# Patient Record
Sex: Female | Born: 1935 | Race: White | Hispanic: No | Marital: Married | Smoking: Former smoker
Health system: Southern US, Community
[De-identification: ages and names within clinical notes are randomized; demographics above are authoritative.]

## PROBLEM LIST (undated history)

## (undated) DIAGNOSIS — R112 Nausea with vomiting, unspecified: Secondary | ICD-10-CM

## (undated) DIAGNOSIS — K449 Diaphragmatic hernia without obstruction or gangrene: Secondary | ICD-10-CM

## (undated) DIAGNOSIS — R3915 Urgency of urination: Secondary | ICD-10-CM

## (undated) DIAGNOSIS — Z8709 Personal history of other diseases of the respiratory system: Secondary | ICD-10-CM

## (undated) DIAGNOSIS — J479 Bronchiectasis, uncomplicated: Secondary | ICD-10-CM

## (undated) DIAGNOSIS — F0390 Unspecified dementia without behavioral disturbance: Secondary | ICD-10-CM

## (undated) DIAGNOSIS — I7121 Aneurysm of the ascending aorta, without rupture: Secondary | ICD-10-CM

## (undated) DIAGNOSIS — Z8041 Family history of malignant neoplasm of ovary: Secondary | ICD-10-CM

## (undated) DIAGNOSIS — Z803 Family history of malignant neoplasm of breast: Secondary | ICD-10-CM

## (undated) DIAGNOSIS — Z8619 Personal history of other infectious and parasitic diseases: Secondary | ICD-10-CM

## (undated) DIAGNOSIS — Z800 Family history of malignant neoplasm of digestive organs: Secondary | ICD-10-CM

## (undated) DIAGNOSIS — M1990 Unspecified osteoarthritis, unspecified site: Secondary | ICD-10-CM

## (undated) DIAGNOSIS — Z9889 Other specified postprocedural states: Secondary | ICD-10-CM

## (undated) DIAGNOSIS — Z87440 Personal history of urinary (tract) infections: Secondary | ICD-10-CM

## (undated) DIAGNOSIS — H40009 Preglaucoma, unspecified, unspecified eye: Secondary | ICD-10-CM

## (undated) DIAGNOSIS — J189 Pneumonia, unspecified organism: Secondary | ICD-10-CM

## (undated) DIAGNOSIS — Z87442 Personal history of urinary calculi: Secondary | ICD-10-CM

## (undated) DIAGNOSIS — I712 Thoracic aortic aneurysm, without rupture: Secondary | ICD-10-CM

## (undated) HISTORY — PX: CATARACT EXTRACTION W/ INTRAOCULAR LENS IMPLANT: SHX1309

## (undated) HISTORY — DX: Family history of malignant neoplasm of digestive organs: Z80.0

## (undated) HISTORY — DX: Family history of malignant neoplasm of breast: Z80.3

## (undated) HISTORY — PX: DILATION AND CURETTAGE OF UTERUS: SHX78

## (undated) HISTORY — PX: BREAST EXCISIONAL BIOPSY: SUR124

## (undated) HISTORY — DX: Family history of malignant neoplasm of ovary: Z80.41

---

## 1972-08-09 HISTORY — PX: TUBAL LIGATION: SHX77

## 1987-08-10 HISTORY — PX: NASAL SEPTUM SURGERY: SHX37

## 1996-08-09 HISTORY — PX: PUBOVAGINAL SLING: SHX1035

## 1997-08-09 HISTORY — PX: KNEE ARTHROSCOPY: SUR90

## 1998-02-04 ENCOUNTER — Ambulatory Visit (HOSPITAL_BASED_OUTPATIENT_CLINIC_OR_DEPARTMENT_OTHER): Admission: RE | Admit: 1998-02-04 | Discharge: 1998-02-04 | Payer: Self-pay | Admitting: Orthopedic Surgery

## 1999-02-04 ENCOUNTER — Other Ambulatory Visit: Admission: RE | Admit: 1999-02-04 | Discharge: 1999-02-04 | Payer: Self-pay | Admitting: Obstetrics and Gynecology

## 2000-09-26 ENCOUNTER — Ambulatory Visit (HOSPITAL_COMMUNITY): Admission: RE | Admit: 2000-09-26 | Discharge: 2000-09-26 | Payer: Self-pay | Admitting: Gastroenterology

## 2001-06-26 ENCOUNTER — Other Ambulatory Visit: Admission: RE | Admit: 2001-06-26 | Discharge: 2001-06-26 | Payer: Self-pay | Admitting: Obstetrics and Gynecology

## 2002-07-04 ENCOUNTER — Other Ambulatory Visit: Admission: RE | Admit: 2002-07-04 | Discharge: 2002-07-04 | Payer: Self-pay | Admitting: Obstetrics and Gynecology

## 2002-12-21 ENCOUNTER — Encounter: Admission: RE | Admit: 2002-12-21 | Discharge: 2002-12-21 | Payer: Self-pay | Admitting: Obstetrics and Gynecology

## 2002-12-21 ENCOUNTER — Encounter: Payer: Self-pay | Admitting: Obstetrics and Gynecology

## 2002-12-26 ENCOUNTER — Encounter: Payer: Self-pay | Admitting: Obstetrics and Gynecology

## 2002-12-26 ENCOUNTER — Encounter: Admission: RE | Admit: 2002-12-26 | Discharge: 2002-12-26 | Payer: Self-pay | Admitting: Obstetrics and Gynecology

## 2003-07-08 ENCOUNTER — Other Ambulatory Visit: Admission: RE | Admit: 2003-07-08 | Discharge: 2003-07-08 | Payer: Self-pay | Admitting: Obstetrics and Gynecology

## 2003-12-26 ENCOUNTER — Encounter: Admission: RE | Admit: 2003-12-26 | Discharge: 2003-12-26 | Payer: Self-pay | Admitting: Obstetrics and Gynecology

## 2004-07-08 ENCOUNTER — Other Ambulatory Visit: Admission: RE | Admit: 2004-07-08 | Discharge: 2004-07-08 | Payer: Self-pay | Admitting: Obstetrics and Gynecology

## 2004-12-28 ENCOUNTER — Encounter: Admission: RE | Admit: 2004-12-28 | Discharge: 2004-12-28 | Payer: Self-pay | Admitting: Obstetrics and Gynecology

## 2005-07-21 ENCOUNTER — Other Ambulatory Visit: Admission: RE | Admit: 2005-07-21 | Discharge: 2005-07-21 | Payer: Self-pay | Admitting: Obstetrics and Gynecology

## 2005-12-31 ENCOUNTER — Encounter: Admission: RE | Admit: 2005-12-31 | Discharge: 2005-12-31 | Payer: Self-pay | Admitting: Obstetrics and Gynecology

## 2006-01-12 ENCOUNTER — Encounter: Admission: RE | Admit: 2006-01-12 | Discharge: 2006-01-12 | Payer: Self-pay | Admitting: Orthopedic Surgery

## 2007-01-10 ENCOUNTER — Encounter: Admission: RE | Admit: 2007-01-10 | Discharge: 2007-01-10 | Payer: Self-pay | Admitting: Emergency Medicine

## 2008-01-11 ENCOUNTER — Encounter: Admission: RE | Admit: 2008-01-11 | Discharge: 2008-01-11 | Payer: Self-pay | Admitting: Physician Assistant

## 2009-01-27 ENCOUNTER — Encounter: Admission: RE | Admit: 2009-01-27 | Discharge: 2009-01-27 | Payer: Self-pay | Admitting: Obstetrics and Gynecology

## 2010-02-03 ENCOUNTER — Encounter: Admission: RE | Admit: 2010-02-03 | Discharge: 2010-02-03 | Payer: Self-pay | Admitting: Obstetrics and Gynecology

## 2010-12-25 NOTE — Procedures (Signed)
[facility]  Patient:    Tina Bell, Tina Bell                         MRN: [id number] Proc. Date: 09/26/00 Adm. Date:  [id number] Attending:  Orland Mustard CC:         Duncan Dull, M.D.   Procedure Report  PROCEDURE:  Colonoscopy.  MEDICATIONS:  Fentanyl 100 mcg, Versed 10 mg IV.  SCOPE:  Olympus pediatric video colonoscope.  INDICATIONS:  Colon cancer screening.  DESCRIPTION OF PROCEDURE:  The procedure had been explained to the patient and consent obtained.  With the patient in the left lateral decubitus position, the Olympus pediatric video colonoscope was inserted and advanced under direct visualization.  The prep was excellent.  We were able to advance to the cecum using abdominal pressure and position changes.  The ileocecal valve was seen well and was normal.  The scope was withdrawn.  The cecum, ascending colon, hepatic flexure, transverse colon, splenic flexure, descending, and sigmoid colon were seen well upon removal.  No significant diverticular disease, no polyps, or other lesions seen.  The patient tolerated the procedure well and was maintained on low-flow oxygen and pulse oximeter throughout the procedure with no obvious problem.  ASSESSMENT:  No evidence of colon polyps.  PLAN:  Routine follow-up with yearly Hemoccults.  Would suggest a sigmoid or colonoscopy in 5-10 years depending on the current recommendations at that time. DD:  09/26/00 TD:  09/27/00 Job: [id number]

## 2010-12-29 ENCOUNTER — Other Ambulatory Visit: Payer: Self-pay | Admitting: Obstetrics and Gynecology

## 2010-12-29 DIAGNOSIS — Z1231 Encounter for screening mammogram for malignant neoplasm of breast: Secondary | ICD-10-CM

## 2011-02-05 ENCOUNTER — Ambulatory Visit
Admission: RE | Admit: 2011-02-05 | Discharge: 2011-02-05 | Disposition: A | Discharge status: Home / Self Care | Payer: BC Managed Care – PPO | Source: Ambulatory Visit | Attending: Obstetrics and Gynecology | Admitting: Obstetrics and Gynecology

## 2011-02-05 DIAGNOSIS — Z1231 Encounter for screening mammogram for malignant neoplasm of breast: Secondary | ICD-10-CM

## 2011-10-11 ENCOUNTER — Encounter: Payer: Self-pay | Admitting: Gastroenterology

## 2012-01-10 ENCOUNTER — Other Ambulatory Visit: Payer: Self-pay | Admitting: Internal Medicine

## 2012-01-10 DIAGNOSIS — Z1231 Encounter for screening mammogram for malignant neoplasm of breast: Secondary | ICD-10-CM

## 2012-02-07 ENCOUNTER — Ambulatory Visit
Admission: RE | Admit: 2012-02-07 | Discharge: 2012-02-07 | Disposition: A | Discharge status: Home / Self Care | Payer: BC Managed Care – PPO | Source: Ambulatory Visit | Attending: Internal Medicine | Admitting: Internal Medicine

## 2012-02-07 DIAGNOSIS — Z1231 Encounter for screening mammogram for malignant neoplasm of breast: Secondary | ICD-10-CM

## 2013-01-15 ENCOUNTER — Other Ambulatory Visit: Payer: Self-pay

## 2013-01-15 DIAGNOSIS — Z1231 Encounter for screening mammogram for malignant neoplasm of breast: Secondary | ICD-10-CM

## 2013-02-08 ENCOUNTER — Ambulatory Visit: Payer: Medicare Other

## 2013-02-22 ENCOUNTER — Ambulatory Visit: Payer: Medicare Other

## 2013-02-26 ENCOUNTER — Ambulatory Visit
Admission: RE | Admit: 2013-02-26 | Discharge: 2013-02-26 | Disposition: A | Discharge status: Home / Self Care | Payer: Medicare Other | Source: Ambulatory Visit

## 2013-02-26 DIAGNOSIS — Z1231 Encounter for screening mammogram for malignant neoplasm of breast: Secondary | ICD-10-CM

## 2013-07-03 ENCOUNTER — Other Ambulatory Visit: Payer: Self-pay | Admitting: Urology

## 2013-07-03 ENCOUNTER — Encounter (HOSPITAL_COMMUNITY): Payer: Self-pay | Admitting: *Deleted

## 2013-07-04 ENCOUNTER — Encounter (HOSPITAL_COMMUNITY): Payer: Self-pay | Admitting: Pharmacy Technician

## 2013-07-07 NOTE — H&P (Signed)
History of Present Illness    F/u - Referred by Dr. Merri Brunette Nov 2014 .     1 - bacteriuria - patient has had 3 urinalyses which showed mixed pyuria and hematuria September, October, and November 2014. She was told she had a "UTI". She was having no symptoms - no dysuria, gross hematuria, frequency or urgency. Two cultures grew out enterococcus. I did not see any sensitivities. She was treated with Cipro November 2014. About 2 weeks ago she developed some frequency. No urgency. These symptoms are somewhat better today. She does take probiotics.   -Nov 2014 (today) PVR 5 ml    2-kidney stones - Nov 2014 - pt woke up with acute onset left flank pain. Pain was moderate and dull, constant. No fevers or chills. Pain recurred a day later. Pain went away. No pain today. No stone passage. She has a history of passing a kidney stone many years ago.   -Nov 2014 KUB today - no right stone, left kidney with three stones and a 9 mm stone over left renal pelvis. Multiple pelvic calcifications.     3-stress urinary incontinence-patient is status post bone anchor pubovaginal sling placed in 1998 by Dr. Logan Bores. Typically, she voids with a good stream and feels empty. No incontinence.     They travel a lot out of the country. They have not been in [place] recently.       Nov 2014 Interval hx  She returns for renal US and cystoscopy. She developed dysuria earlier today and her UA appears infected.     Renal U/S - comparison KUB - findings: right kidney appears norma, no stone, mass or hydro. Left kidney with three shadowing calcifications - correlates to KUB - 7mm, 5mm, 4mm. No hydro or mass. Bladder appears normal. PVR 5 ml   Past Medical History Problems  1. History of arthritis (V13.4)  Current Meds 1. Aspirin 81 MG Oral Tablet;  Therapy: (Recorded:13Nov2014) to Recorded 2. Calcium TABS;  Therapy: (Recorded:13Nov2014) to Recorded 3. CeleBREX CAPS;  Therapy:  (Recorded:13Nov2014) to Recorded 4. Glucosamine-Chondroitin Oral Capsule;  Therapy: (Recorded:13Nov2014) to Recorded 5. Krill Oil CAPS;  Therapy: (Recorded:13Nov2014) to Recorded 6. Multi-Vitamin TABS;  Therapy: (Recorded:13Nov2014) to Recorded 7. Vitamin C TABS;  Therapy: (Recorded:13Nov2014) to Recorded 8. Vitamin D TABS;  Therapy: (Recorded:13Nov2014) to Recorded  Allergies Medication  1. Keflex CAPS 2. Morphine Sulfate (PF) SOLN  Family History Problems  1. Family history of Death : Mother, Father 2. Family history of kidney stones (V18.69) : Mother, Son, Sister 3. Family history of malignant neoplasm of breast (V16.3) : Mother, Sister 4. Family history of Ovarian ca : Mother  Social History Problems  1. Alcohol use 2. Former smoker (V15.82) 3. Married 4. Number of children 5. Retired  Review of Systems Constitutional, skin, eye, otolaryngeal, hematologic/lymphatic, cardiovascular, pulmonary, endocrine, musculoskeletal, gastrointestinal, neurological and psychiatric system(s) were reviewed and pertinent findings if present are noted.    Vitals Vital Signs [Data Includes: Last 1 Day]  Recorded: 25Nov2014 01:55PM  Blood Pressure: 149 / 89 Temperature: 97.8 F Heart Rate: 67  Physical Exam Constitutional: Well nourished and well developed . No acute distress.  Pulmonary: No respiratory distress and normal respiratory rhythm and effort.  Cardiovascular: Heart rate and rhythm are normal . No peripheral edema.  Neuro/Psych:. Mood and affect are appropriate.    Results/Data Urine [Data Includes: Last 1 Day]   25Nov2014  COLOR YELLOW   APPEARANCE CLEAR   SPECIFIC GRAVITY 1.020   pH  6.5   GLUCOSE NEG mg/dL  BILIRUBIN NEG   KETONE TRACE mg/dL  BLOOD TRACE   PROTEIN TRACE mg/dL  UROBILINOGEN 0.2 mg/dL  NITRITE NEG   LEUKOCYTE ESTERASE LARGE   SQUAMOUS EPITHELIAL/HPF FEW   WBC TNTC WBC/hpf  RBC 0-2 RBC/hpf  BACTERIA FEW   CRYSTALS NONE SEEN   CASTS NONE SEEN     Procedure KUB today - bowel gas pattern and bones appeared normal. The right kidney does not appear to contain any calcifications. The left kidney appears to contain 3 calcifications the largest in the midpole possible renal pelvis about 9 mm. There were no obvious stones over the ureters. There are multiple calcifications in the pelvis but these appear inferior and lateral on both sides and are likely pelvic phleboliths.     Assessment Assessed  1. UTI (lower urinary tract infection) (599.0) 2. Calculus of kidney (592.0)  Plan  Calculus of kidney  1. Follow-up Schedule Surgery Office  Follow-up  Status: Complete  Done: 25Nov2014 Health Maintenance  2. UA With REFLEX; Status:Complete;   Done: 25Nov2014 01:06PM UTI (lower urinary tract infection)  3. Start: Nitrofurantoin Monohyd Macro 100 MG Oral Capsule; TAKE 1 CAPSULE TWICE  DAILY WITH MEALS  URINE CULTURE; Status:In Progress - Specimen/Data Collected;  Done: 25Nov2014 Perform:[facility]; Due:27Nov2014; Marked Important; Last Updated ZO:XWRUEA, Clydie Braun; 07/03/2013 2:09:08 PM;Ordered; Today;  For:UTI (lower urinary tract infection); Ordered VW:[id number], Lillard Bailon;   Discussion/Summary UTI - Will hold off on cystoscopy today. Send urine for Cx and start NF. Consider prophylaxis.   Nephrloithiasis - discussed KUB and U/S findings (reviewed images with patient) and the nature, R/B of surveillance, ESWL or URS. All questions answered. They elect to proceed with treatment. She feels like she started to pass one earlier this month and they travel a lot and don't want to risk stone passage afar. We discussed risks of ESWL such as infection, bleeding, failure to fragment or pass among others.     I'll plan to perform cystoscopy when she returns if urine clear and/or she is asymptomatic.      Signatures Electronically signed by : Jerilee Field, M.D.; Jul 03 2013  2:42PM EST  ADD: Urine cx grew enterococcus - sensitive to NF. I changed  pre-op abx to Levaquin.

## 2013-07-09 ENCOUNTER — Encounter (HOSPITAL_COMMUNITY): Payer: Self-pay | Admitting: General Practice

## 2013-07-09 ENCOUNTER — Ambulatory Visit (HOSPITAL_COMMUNITY): Payer: Medicare Other

## 2013-07-09 ENCOUNTER — Ambulatory Visit (HOSPITAL_COMMUNITY)
Admission: RE | Admit: 2013-07-09 | Discharge: 2013-07-09 | Disposition: A | Discharge status: Home / Self Care | Payer: Medicare Other | Source: Ambulatory Visit | Attending: Urology | Admitting: Urology

## 2013-07-09 ENCOUNTER — Encounter (HOSPITAL_COMMUNITY)
Admission: RE | Disposition: A | Discharge status: Home / Self Care | Payer: Self-pay | Source: Ambulatory Visit | Attending: Urology

## 2013-07-09 ENCOUNTER — Other Ambulatory Visit: Payer: Self-pay | Admitting: Urology

## 2013-07-09 DIAGNOSIS — N390 Urinary tract infection, site not specified: Secondary | ICD-10-CM | POA: Insufficient documentation

## 2013-07-09 DIAGNOSIS — R3129 Other microscopic hematuria: Secondary | ICD-10-CM | POA: Insufficient documentation

## 2013-07-09 DIAGNOSIS — N200 Calculus of kidney: Secondary | ICD-10-CM | POA: Insufficient documentation

## 2013-07-09 DIAGNOSIS — N393 Stress incontinence (female) (male): Secondary | ICD-10-CM | POA: Insufficient documentation

## 2013-07-09 DIAGNOSIS — Z79899 Other long term (current) drug therapy: Secondary | ICD-10-CM | POA: Insufficient documentation

## 2013-07-09 DIAGNOSIS — Z7982 Long term (current) use of aspirin: Secondary | ICD-10-CM | POA: Insufficient documentation

## 2013-07-09 HISTORY — DX: Pneumonia, unspecified organism: J18.9

## 2013-07-09 HISTORY — PX: EXTRACORPOREAL SHOCK WAVE LITHOTRIPSY: SHX1557

## 2013-07-09 HISTORY — DX: Unspecified osteoarthritis, unspecified site: M19.90

## 2013-07-09 HISTORY — DX: Personal history of urinary calculi: Z87.442

## 2013-07-09 SURGERY — LITHOTRIPSY, ESWL
Anesthesia: LOCAL | Laterality: Left

## 2013-07-09 MED ORDER — SODIUM CHLORIDE 0.9 % IV SOLN
INTRAVENOUS | Status: DC
  Administered 2013-07-09: 14:00:00 via INTRAVENOUS

## 2013-07-09 MED ORDER — DIPHENHYDRAMINE HCL 25 MG PO CAPS
25.0000 mg | ORAL_CAPSULE | ORAL | Status: AC
  Administered 2013-07-09: 25 mg via ORAL
  Filled 2013-07-09: qty 1

## 2013-07-09 MED ORDER — OXYCODONE-ACETAMINOPHEN 5-325 MG PO TABS: 1.0000 | ORAL_TABLET | Freq: Four times a day (QID) | ORAL | Status: DC | PRN

## 2013-07-09 MED ORDER — LEVOFLOXACIN 500 MG PO TABS: 500.0000 mg | ORAL_TABLET | Freq: Every day | ORAL | Status: DC

## 2013-07-09 MED ORDER — LEVOFLOXACIN IN D5W 750 MG/150ML IV SOLN
500.0000 mg | INTRAVENOUS | Status: AC
  Administered 2015-11-28: 500 mg via INTRAVENOUS

## 2013-07-09 MED ORDER — DIAZEPAM 5 MG PO TABS
10.0000 mg | ORAL_TABLET | ORAL | Status: AC
  Administered 2013-07-09: 10 mg via ORAL
  Filled 2013-07-09: qty 2

## 2013-07-09 NOTE — Brief Op Note (Signed)
07/09/2013  See scanned [facility] center Op note

## 2013-07-09 NOTE — Interval H&P Note (Signed)
History and Physical Interval Note:  07/09/2013 4:44 PM  Tina Bell  has presented today for surgery, with the diagnosis of LEFT NEPHROLITHIASIS   The various methods of treatment have been discussed with the patient and family. After consideration of risks, benefits and other options for treatment, the patient has consented to  Procedure(s): LEFT EXTRACORPOREAL SHOCK WAVE LITHOTRIPSY (ESWL) (Left) as a surgical intervention .  The patient's history has been reviewed, patient examined, no change in status, stable for surgery.  I have reviewed the patient's chart and labs.  Questions were answered to the patient's satisfaction.  Pt reports she has been taking NF. She has been well.    Antony Haste

## 2013-11-02 DIAGNOSIS — C4492 Squamous cell carcinoma of skin, unspecified: Secondary | ICD-10-CM

## 2013-11-02 HISTORY — DX: Squamous cell carcinoma of skin, unspecified: C44.92

## 2013-12-26 ENCOUNTER — Institutional Professional Consult (permissible substitution): Payer: Medicare Other | Admitting: Internal Medicine

## 2014-01-02 ENCOUNTER — Ambulatory Visit (INDEPENDENT_AMBULATORY_CARE_PROVIDER_SITE_OTHER): Payer: Medicare Other | Admitting: Pulmonary Disease

## 2014-01-02 ENCOUNTER — Encounter: Payer: Self-pay | Admitting: Pulmonary Disease

## 2014-01-02 VITALS — BP 116/68 | HR 60 | Ht 63.0 in | Wt 126.0 lb

## 2014-01-02 DIAGNOSIS — J189 Pneumonia, unspecified organism: Secondary | ICD-10-CM

## 2014-01-02 DIAGNOSIS — J479 Bronchiectasis, uncomplicated: Secondary | ICD-10-CM

## 2014-01-02 NOTE — Telephone Encounter (Signed)
5/27 mychart email from patient: Were you able to add the additional test to my blood work taken this AM at Dr. Pennie Banter office?   Tina Bell   Pt seen today by BQ for new consult referred by Dr Archie Endo, do you know what exactly pt is asking?  Thanks!

## 2014-01-02 NOTE — Assessment & Plan Note (Signed)
She has been told that she has recurrent pneumonia. However, have reviewed the x-ray images from her most recent episode of "pneumonia". There is no evidence of an infiltrate that I can tell. It also sounds like she never had a fever. So based on this, it's hard for me to say that she truly had pneumonia. However, she does note multiple very lengthy courses of either pneumonia or bronchitis over the years. She also notes that she had pertussis as a child. This makes me wonder if she does not in fact have bronchiectasis.  Plan: -CT chest, high-resolution to look for bronchiectasis -Full pulmonary function testing -Check serum immunoglobulins and complement levels to check for systemic immunodeficiency (though I doubt that this is the case as she does not have recurrent infections of other types with the exception of the occasional UTI) Followup 4 weeks

## 2014-01-02 NOTE — Progress Notes (Signed)
Subjective:    Patient ID: Tina Bell, female    DOB: May 30, 1936, 78 y.o.   MRN: [id number]  HPI  This is a very pleasant 78 year old female who comes to my clinic today for evaluation of the possibility of recurrent pneumonia. She stated that she had pneumonia in 1997 and then again in 2000. She was evaluated by my partner at that time. She had lung function testing which was normal and she was told that in fact she did not have recurrent pneumonia but just bronchitis on one episode of pneumonia second time. Over the years she has not had recurrent infections on an annual basis or even frequent infections with the exception of 2 occasions. She did have an episode of either bronchitis versus pneumonia in 2009. This was treated as an outpatient. She said that she needed antibiotics for approximately 14 days before she got better. Then in 2015 she was going on a trip to [place] and again she got an episode of a bad respiratory infection. She said that she felt subjective fevers but she did not have a fever when she sought medical care. She did have a cough productive of some sputum.  Apparently a chest x-ray was suggestive of some possible infiltrates in the right hilum. She was treated with azithromycin for 3 days and apparently her symptoms nearly completely resolved. However, she sought care with her primary care physician at home and because symptoms have not completely resolved she was treated with 5 more days of azithromycin. She now states that she is doing well. With the exception of some knee pain she does not have shortness of breath.  In between episodes of infection she does not have problems with recurrent bronchitis or shortness of breath. She stays quite active with tennis and golf.  Past Medical History  Diagnosis Date  . Pneumonia 1997  . History of kidney stones 6294,7654  . Arthritis     knees  . Complication of anesthesia     difficulty waking up from anesthesia  . Hx of  seasonal allergies      Family History  Problem Relation Age of Onset  . Cancer Mother     ovarian  . Cancer Sister     breast     History   Social History  . Marital Status: Married    Spouse Name: N/A    Number of Children: N/A  . Years of Education: N/A   Occupational History  . Not on file.   Social History Main Topics  . Smoking status: Former Smoker -- 0.20 packs/day for 5 years    Types: Cigarettes    Quit date: 05/03/1962  . Smokeless tobacco: Never Used     Comment: Smoked 1 pack/week while smoking.   . Alcohol Use: Yes     Comment: occasional drink  . Drug Use: No  . Sexual Activity: Not on file   Other Topics Concern  . Not on file   Social History Narrative  . No narrative on file     Allergies  Allergen Reactions  . Keflex [Cephalexin] Hives  . Morphine And Related Hives     Outpatient Prescriptions Prior to Visit  Medication Sig Dispense Refill  . aspirin 81 MG tablet Take 81 mg by mouth daily.      . calcium-vitamin D (OSCAL WITH D) 500-200 MG-UNIT per tablet Take 1 tablet by mouth 2 (two) times daily.       . celecoxib (CELEBREX) 200 MG  capsule Take 200 mg by mouth 2 (two) times daily.      . cholecalciferol (VITAMIN D) 1000 UNITS tablet Take 1,000 Units by mouth daily.      Javier Docker Oil 1000 MG CAPS Take 1 capsule by mouth 2 (two) times daily.       . Multiple Vitamins-Minerals (WOMENS MULTI VITAMIN & MINERAL PO) Take by mouth daily.      . Probiotic Product (PROBIOTIC DAILY PO) Take 1 tablet by mouth daily.       . vitamin C (ASCORBIC ACID) 500 MG tablet Take 500 mg by mouth daily.      . Glucosamine-Chondroit-Vit C-Mn (GLUCOSAMINE 1500 COMPLEX PO) Take 2 tablets by mouth 2 (two) times daily.       Marland Kitchen levofloxacin (LEVAQUIN) 500 MG tablet Take 1 tablet (500 mg total) by mouth daily.  3 tablet  0  . nitrofurantoin (MACRODANTIN) 100 MG capsule Take 100 mg by mouth 2 (two) times daily. Started 07/03/13 and to end 07/07/13 PM (10 doses only)        . oxyCODONE-acetaminophen (ROXICET) 5-325 MG per tablet Take 1-2 tablets by mouth every 6 (six) hours as needed for severe pain.  30 tablet  0   Facility-Administered Medications Prior to Visit  Medication Dose Route Frequency Provider Last Rate Last Dose  . levofloxacin (LEVAQUIN) IVPB 500 mg  500 mg Intravenous Q24H Festus Aloe, MD          Review of Systems  Constitutional: Positive for unexpected weight change. Negative for fever.  HENT: Positive for sinus pressure. Negative for congestion, dental problem, ear pain, nosebleeds, postnasal drip, rhinorrhea, sneezing, sore throat and trouble swallowing.   Eyes: Negative for redness and itching.  Respiratory: Positive for cough and chest tightness. Negative for shortness of breath and wheezing.   Cardiovascular: Negative for palpitations and leg swelling.  Gastrointestinal: Negative for nausea and vomiting.  Genitourinary: Negative for dysuria.  Musculoskeletal: Negative for joint swelling.  Skin: Negative for rash.  Neurological: Negative for headaches.  Hematological: Does not bruise/bleed easily.  Psychiatric/Behavioral: Negative for dysphoric mood. The patient is not nervous/anxious.        Objective:   Physical Exam Filed Vitals:   01/02/14 1135  BP: 116/68  Pulse: 60  Height: 5\' 3"  (1.6 m)  Weight: 126 lb (57.153 kg)  SpO2: 98%  RA  Gen: well appearing, no acute distress HEENT: NCAT, PERRL, EOMi, OP clear, neck supple without masses PULM: CTA B CV: RRR, no mgr, no JVD AB: BS+, soft, nontender, no hsm Ext: warm, no edema, no clubbing, no cyanosis Derm: no rash or skin breakdown Neuro: A&Ox4, CN II-XII intact, strength 5/5 in all 4 extremities   May 2015 chest x-ray reviewed> Normal lung parenchyma no infiltrate normal cardiac silhouette      Assessment & Plan:   CAP (community acquired pneumonia) She has been told that she has recurrent pneumonia. However, have reviewed the x-ray images from her most  recent episode of "pneumonia". There is no evidence of an infiltrate that I can tell. It also sounds like she never had a fever. So based on this, it's hard for me to say that she truly had pneumonia. However, she does note multiple very lengthy courses of either pneumonia or bronchitis over the years. She also notes that she had pertussis as a child. This makes me wonder if she does not in fact have bronchiectasis.  Plan: -CT chest, high-resolution to look for bronchiectasis -Full pulmonary function testing -  Check serum immunoglobulins and complement levels to check for systemic immunodeficiency (though I doubt that this is the case as she does not have recurrent infections of other types with the exception of the occasional UTI) Followup 4 weeks     Updated Medication List Outpatient Encounter Prescriptions as of 01/02/2014  Medication Sig  . aspirin 81 MG tablet Take 81 mg by mouth daily.  . calcium-vitamin D (OSCAL WITH D) 500-200 MG-UNIT per tablet Take 1 tablet by mouth 2 (two) times daily.   . celecoxib (CELEBREX) 200 MG capsule Take 200 mg by mouth 2 (two) times daily.  . cholecalciferol (VITAMIN D) 1000 UNITS tablet Take 1,000 Units by mouth daily.  Javier Docker Oil 1000 MG CAPS Take 1 capsule by mouth 2 (two) times daily.   . Multiple Vitamins-Minerals (WOMENS MULTI VITAMIN & MINERAL PO) Take by mouth daily.  . Probiotic Product (PROBIOTIC DAILY PO) Take 1 tablet by mouth daily.   . vitamin C (ASCORBIC ACID) 500 MG tablet Take 500 mg by mouth daily.  . [DISCONTINUED] Glucosamine-Chondroit-Vit C-Mn (GLUCOSAMINE 1500 COMPLEX PO) Take 2 tablets by mouth 2 (two) times daily.   . [DISCONTINUED] levofloxacin (LEVAQUIN) 500 MG tablet Take 1 tablet (500 mg total) by mouth daily.  . [DISCONTINUED] nitrofurantoin (MACRODANTIN) 100 MG capsule Take 100 mg by mouth 2 (two) times daily. Started 07/03/13 and to end 07/07/13 PM (10 doses only)  . [DISCONTINUED] oxyCODONE-acetaminophen (ROXICET) 5-325 MG  per tablet Take 1-2 tablets by mouth every 6 (six) hours as needed for severe pain.

## 2014-01-08 ENCOUNTER — Ambulatory Visit (INDEPENDENT_AMBULATORY_CARE_PROVIDER_SITE_OTHER)
Admission: RE | Admit: 2014-01-08 | Discharge: 2014-01-08 | Disposition: A | Discharge status: Home / Self Care | Payer: Medicare Other | Source: Ambulatory Visit | Attending: Pulmonary Disease | Admitting: Pulmonary Disease

## 2014-01-08 DIAGNOSIS — J479 Bronchiectasis, uncomplicated: Secondary | ICD-10-CM

## 2014-01-09 ENCOUNTER — Encounter: Payer: Self-pay | Admitting: Pulmonary Disease

## 2014-01-09 DIAGNOSIS — J479 Bronchiectasis, uncomplicated: Secondary | ICD-10-CM | POA: Insufficient documentation

## 2014-01-10 ENCOUNTER — Ambulatory Visit (HOSPITAL_COMMUNITY)
Admission: RE | Admit: 2014-01-10 | Discharge: 2014-01-10 | Disposition: A | Discharge status: Home / Self Care | Payer: Medicare Other | Source: Ambulatory Visit | Attending: Pulmonary Disease | Admitting: Pulmonary Disease

## 2014-01-10 ENCOUNTER — Telehealth: Payer: Self-pay | Admitting: Pulmonary Disease

## 2014-01-10 DIAGNOSIS — R05 Cough: Secondary | ICD-10-CM | POA: Insufficient documentation

## 2014-01-10 DIAGNOSIS — R059 Cough, unspecified: Secondary | ICD-10-CM | POA: Insufficient documentation

## 2014-01-10 DIAGNOSIS — J479 Bronchiectasis, uncomplicated: Secondary | ICD-10-CM

## 2014-01-10 DIAGNOSIS — Z87891 Personal history of nicotine dependence: Secondary | ICD-10-CM | POA: Insufficient documentation

## 2014-01-10 MED ORDER — ALBUTEROL SULFATE (2.5 MG/3ML) 0.083% IN NEBU
2.5000 mg | INHALATION_SOLUTION | Freq: Once | RESPIRATORY_TRACT | Status: AC
  Administered 2014-01-10: 2.5 mg via RESPIRATORY_TRACT

## 2014-01-10 NOTE — Telephone Encounter (Signed)
A, Please let her know that this showed bronchiectasis (the disease associated with pertussis she and I talked about) and we will discuss more in clinic. Thanks B --  [email or web address] x1

## 2014-01-10 NOTE — Telephone Encounter (Signed)
Pt returning call.Tina Bell ° °

## 2014-01-10 NOTE — Progress Notes (Signed)
Quick Note:  lmtcb X1 to relay results. Pt also needs a rov scheduled. Please try to schedule on/around 6/19 with BQ in [place]. ______

## 2014-01-10 NOTE — Telephone Encounter (Signed)
I spoke with patient about results and she verbalized understanding and had no questions 

## 2014-01-15 LAB — PULMONARY FUNCTION TEST
DL/VA % pred: 93 %
DL/VA: 4.36 ml/min/mmHg/L
DLCO unc % pred: 73 %
DLCO unc: 16.84 ml/min/mmHg
FEF 25-75 Post: 1.7 L/sec
FEF 25-75 Pre: 1.8 L/sec
FEF2575-%Change-Post: -5 %
FEF2575-%Pred-Post: 108 %
FEF2575-%Pred-Pre: 114 %
FEV1-%Change-Post: -3 %
FEV1-%Pred-Post: 93 %
FEV1-%Pred-Pre: 97 %
FEV1-Post: 1.86 L
FEV1-Pre: 1.93 L
FEV1FVC-%Change-Post: -2 %
FEV1FVC-%Pred-Pre: 108 %
FEV6-%Change-Post: 0 %
FEV6-%Pred-Post: 92 %
FEV6-%Pred-Pre: 93 %
FEV6-Post: 2.35 L
FEV6-Pre: 2.37 L
FEV6FVC-%Pred-Post: 105 %
FEV6FVC-%Pred-Pre: 105 %
FVC-%Change-Post: 0 %
FVC-%Pred-Post: 87 %
FVC-%Pred-Pre: 88 %
FVC-Post: 2.35 L
FVC-Pre: 2.37 L
Post FEV1/FVC ratio: 79 %
Post FEV6/FVC ratio: 100 %
Pre FEV1/FVC ratio: 82 %
Pre FEV6/FVC Ratio: 100 %
RV % pred: 99 %
RV: 2.25 L
TLC % pred: 92 %
TLC: 4.51 L

## 2014-01-17 ENCOUNTER — Ambulatory Visit: Payer: Medicare Other

## 2014-01-17 DIAGNOSIS — J189 Pneumonia, unspecified organism: Secondary | ICD-10-CM

## 2014-01-18 ENCOUNTER — Telehealth: Payer: Self-pay | Admitting: Pulmonary Disease

## 2014-01-18 LAB — IGG, IGA, IGM
IgA: 260 mg/dL (ref 69–380)
IgG (Immunoglobin G), Serum: 807 mg/dL (ref 690–1700)
IgM, Serum: 232 mg/dL (ref 52–322)

## 2014-01-18 LAB — C4 COMPLEMENT: C4 Complement: 34 mg/dL (ref 10–40)

## 2014-01-18 LAB — C3 COMPLEMENT: C3 Complement: 103 mg/dL (ref 90–180)

## 2014-01-18 NOTE — Telephone Encounter (Signed)
I spoke with Tina Bell at [facility] and she states they could not perform the CH50 Complement total because they did not receive it frozen. She states that test requires a frozen pour. Please advise and we can call the pt and bring her back in for a re-draw. Thanks. Depew Bing, CMA

## 2014-01-20 NOTE — Telephone Encounter (Signed)
I may order it in clinic next time, nothing extra to do now

## 2014-01-21 ENCOUNTER — Encounter: Payer: Self-pay | Admitting: Pulmonary Disease

## 2014-01-21 NOTE — Telephone Encounter (Signed)
Called and spoke to [place] with [facility]. Informed her that BQ will not be re-ordering lab and to not anticipate pt coming back in for a re-draw this time. Nothing further needed.

## 2014-01-22 NOTE — Progress Notes (Signed)
Quick Note:  Pt aware of results and recs. Nothing further needed. ______

## 2014-02-04 ENCOUNTER — Ambulatory Visit (INDEPENDENT_AMBULATORY_CARE_PROVIDER_SITE_OTHER): Payer: Medicare Other | Admitting: Pulmonary Disease

## 2014-02-04 ENCOUNTER — Encounter: Payer: Self-pay | Admitting: Pulmonary Disease

## 2014-02-04 VITALS — BP 128/72 | HR 78 | Ht 63.0 in | Wt 128.0 lb

## 2014-02-04 DIAGNOSIS — J479 Bronchiectasis, uncomplicated: Secondary | ICD-10-CM

## 2014-02-04 MED ORDER — AMOXICILLIN 500 MG PO CAPS: 500.0000 mg | ORAL_CAPSULE | Freq: Two times a day (BID) | ORAL | Status: DC

## 2014-02-04 NOTE — Assessment & Plan Note (Signed)
Her CT chest was consistent with bronchiectasis. I believe that this is due to a severe pertussis infection as a child. Her other laboratory testing was not suggestive of any sort of underlying immunodeficiency. She does not have a family history consistent with lung disease so I don't think there is a role for testing for cystic fibrosis.  Overall, her bronchiectasis is mild in that her lung function testing was essentially completely normal and she has not had a severe flare.  Today was in a long time talking about the ways that she should treat her bronchiectasis. Specifically, I advised her that whenever she gets an upper respiratory infection she should be treated with 14 days of antibiotics.  However, today she notes that she has had several days of increasing cough, sputum production, and chest tightness. This is likely consistent with a mild flare of bronchiectasis.  Plan: -Amoxicillin twice a day for 14 days, 1 refill written for her upcoming trip overseas (to be taken if she develops an infection) -No indication for inhaled therapy -Given the fact that she typically does not have mucus production when she's not having a flare I see no role for routine mucociliary clearance measures -Followup in 6 months

## 2014-02-04 NOTE — Patient Instructions (Signed)
Take the amoxicillin twice a day for two weeks Provide [place] with a sample of mucus when you can We will see you back in 6 months or sooner if needed

## 2014-02-04 NOTE — Progress Notes (Signed)
Subjective:    Patient ID: Tina Bell, female    DOB: [birth year], 78 y.o.   MRN: [id number]  Synopsis: First referred to [facility] in 2015. Found to have bronchiectasis on high-risk CT in 2015 this is believed to be due to 82 severe pertussis infection as a child. Never smoked 12/2013 CT chest high res (entrikin read)> scattered throughout the lungs, particularly in the lingula and RML, there is cylindrical bronchiectasis and bronchiolectasis with nodularity suggestive of MAI, extensive air trapping and atherosclerosis, small hiatal hernia June 2015 pulmonary function test> normal ratio, FEV1 93% predicted, no change with bronchodilator, total lung capacity normal, DLCO slightly low at 73% predicted  HPI  02/04/2014 routine office visit> Tina Bell tells me that for the last several days she's had increasing chest tightness, wheezing, and mucus production. She has been coughing up yellow mucus for about a week now. Other than that she has been doing well. She has not had fevers or chills or chest pain. She denies nausea vomiting or diarrhea. She has not taken any medication for the cough. She has an upcoming trip to the [place] planned.  Past Medical History  Diagnosis Date  . Pneumonia 1997  . History of kidney stones [id number]  . Arthritis     knees  . Complication of anesthesia     difficulty waking up from anesthesia  . Hx of seasonal allergies      Review of Systems     Objective:   Physical Exam Filed Vitals:   02/04/14 1406  BP: 128/72  Pulse: 78  Height: 5\' 3"  (1.6 m)  Weight: 128 lb (58.06 kg)  SpO2: 95%   RA  Gen: well appearing, no acute distress HEENT: NCAT, OP clear, PULM: CTA B CV: RRR, no mgr, no JVD AB: BS+, soft, nontender, no hsm Ext: warm, no edema, no clubbing, no cyanosis Derm: no rash or skin breakdown     Assessment & Plan:   Bronchiectasis without acute exacerbation Her CT chest was consistent with bronchiectasis. I believe  that this is due to a severe pertussis infection as a child. Her other laboratory testing was not suggestive of any sort of underlying immunodeficiency. She does not have a family history consistent with lung disease so I don't think there is a role for testing for cystic fibrosis.  Overall, her bronchiectasis is mild in that her lung function testing was essentially completely normal and she has not had a severe flare.  Today was in a long time talking about the ways that she should treat her bronchiectasis. Specifically, I advised her that whenever she gets an upper respiratory infection she should be treated with 14 days of antibiotics.  However, today she notes that she has had several days of increasing cough, sputum production, and chest tightness. This is likely consistent with a mild flare of bronchiectasis.  Plan: -Amoxicillin twice a day for 14 days, 1 refill written for her upcoming trip overseas (to be taken if she develops an infection) -No indication for inhaled therapy -Given the fact that she typically does not have mucus production when she's not having a flare I see no role for routine mucociliary clearance measures -Followup in 6 months      Updated Medication List Outpatient Encounter Prescriptions as of 02/04/2014  Medication Sig  . aspirin 81 MG tablet Take 81 mg by mouth daily.  . calcium-vitamin D (OSCAL WITH D) 500-200 MG-UNIT per tablet Take 1 tablet by mouth 2 (two) times daily.   Marland Kitchen  celecoxib (CELEBREX) 200 MG capsule Take 200 mg by mouth 2 (two) times daily.  . cholecalciferol (VITAMIN D) 1000 UNITS tablet Take 1,000 Units by mouth daily.  Javier Docker Oil 1000 MG CAPS Take 1 capsule by mouth 2 (two) times daily.   . Multiple Vitamins-Minerals (WOMENS MULTI VITAMIN & MINERAL PO) Take by mouth daily.  . Probiotic Product (PROBIOTIC DAILY PO) Take 1 tablet by mouth daily.   . vitamin C (ASCORBIC ACID) 500 MG tablet Take 500 mg by mouth daily.  Marland Kitchen amoxicillin (AMOXIL)  500 MG capsule Take 1 capsule (500 mg total) by mouth 2 (two) times daily.

## 2014-02-05 ENCOUNTER — Other Ambulatory Visit: Payer: Self-pay

## 2014-02-05 DIAGNOSIS — Z1231 Encounter for screening mammogram for malignant neoplasm of breast: Secondary | ICD-10-CM

## 2014-03-04 ENCOUNTER — Ambulatory Visit
Admission: RE | Admit: 2014-03-04 | Discharge: 2014-03-04 | Disposition: A | Discharge status: Home / Self Care | Payer: Medicare Other | Source: Ambulatory Visit

## 2014-03-04 DIAGNOSIS — Z1231 Encounter for screening mammogram for malignant neoplasm of breast: Secondary | ICD-10-CM

## 2014-07-23 ENCOUNTER — Ambulatory Visit (INDEPENDENT_AMBULATORY_CARE_PROVIDER_SITE_OTHER): Payer: Medicare Other | Admitting: Pulmonary Disease

## 2014-07-23 ENCOUNTER — Encounter: Payer: Self-pay | Admitting: Pulmonary Disease

## 2014-07-23 VITALS — BP 138/88 | HR 72 | Temp 97.4°F | Ht 63.0 in

## 2014-07-23 DIAGNOSIS — J479 Bronchiectasis, uncomplicated: Secondary | ICD-10-CM

## 2014-07-23 MED ORDER — FLUTTER DEVI: Status: DC

## 2014-07-23 NOTE — Patient Instructions (Signed)
Use the flutter valve 5-10 puffs 2-3 times a day when you are sick to help you get up mucus Take the Z-pack if needed for a case of bronchitis Use the tussionex at night, be careful not to take this with other sedating medications or alcohol We will see you back in 6 months or sooner if needed

## 2014-07-23 NOTE — Assessment & Plan Note (Signed)
She has very mild bronchiectasis and this has been a stable interval for her. However, she always seems to get flares when she travels outside of the country. I'm not really sure why that is.  Plan: -I'm going to write a prescription for azithromycin as well as a cough medicine for her to use if needed on her next travel adventure -I educated her on the appropriate use of a flutter valve in the setting of bronchitis versus a bronchiectasis flare -I also educated her on the fact that it can take 14-21 days sometimes for cough and mucus production to resolve after an acute bronchiectasis flare

## 2014-07-23 NOTE — Progress Notes (Signed)
Subjective:    Patient ID: Tina Bell, female    DOB: 22-Jul-1936, 78 y.o.   MRN: [id number]  Synopsis: First referred to [facility] in 2015. Found to have bronchiectasis on high-resolution CT in 2015 this is believed to be due to a severe pertussis infection as a child. Never smoked 12/2013 CT chest high res (entrikin read)> scattered throughout the lungs, particularly in the lingula and RML, there is cylindrical bronchiectasis and bronchiolectasis with nodularity suggestive of MAI, extensive air trapping and atherosclerosis, small hiatal hernia June 2015 pulmonary function test> normal ratio, FEV1 93% predicted, no change with bronchodilator, total lung capacity normal, DLCO slightly low at 73% predicted  HPI  Chief Complaint  Patient presents with  . Follow-up    Pt having no breathing complaints at this time aside from occasional runny nose.     Tina Bell traveled recently to [place] and somewhat predictably she developed a cough.  She took a cough medicine and decongestant from a local pharmacy and then then needed the azithromycin.  Otherwise she is doing well, no dyspnea.   Past Medical History  Diagnosis Date  . Pneumonia 1997  . History of kidney stones 3354,5625  . Arthritis     knees  . Complication of anesthesia     difficulty waking up from anesthesia  . Hx of seasonal allergies      Review of Systems     Objective:   Physical Exam Filed Vitals:   07/23/14 0917  BP: 138/88  Pulse: 72  Temp: 97.4 F (36.3 C)  TempSrc: Oral  Height: 5\' 3"  (1.6 m)  SpO2: 98%   RA  Gen: well appearing, no acute distress HEENT: NCAT, OP clear, PULM: CTA B CV: RRR, no mgr, no JVD AB: BS+, soft, nontender, no hsm Ext: warm, no edema, no clubbing, no cyanosis Derm: no rash or skin breakdown     Assessment & Plan:   Bronchiectasis without acute exacerbation She has very mild bronchiectasis and this has been a stable interval for her. However, she always seems  to get flares when she travels outside of the country. I'm not really sure why that is.  Plan: -I'm going to write a prescription for azithromycin as well as a cough medicine for her to use if needed on her next travel adventure -I educated her on the appropriate use of a flutter valve in the setting of bronchitis versus a bronchiectasis flare -I also educated her on the fact that it can take 14-21 days sometimes for cough and mucus production to resolve after an acute bronchiectasis flare    Updated Medication List Outpatient Encounter Prescriptions as of 07/23/2014  Medication Sig  . calcium-vitamin D (OSCAL WITH D) 500-200 MG-UNIT per tablet Take 1 tablet by mouth 2 (two) times daily.   . celecoxib (CELEBREX) 200 MG capsule Take 200 mg by mouth 2 (two) times daily.  . cholecalciferol (VITAMIN D) 1000 UNITS tablet Take 1,000 Units by mouth daily.  Javier Docker Oil 1000 MG CAPS Take 1 capsule by mouth 2 (two) times daily.   . Multiple Vitamins-Minerals (WOMENS MULTI VITAMIN & MINERAL PO) Take by mouth daily.  . vitamin C (ASCORBIC ACID) 500 MG tablet Take 500 mg by mouth daily.  Marland Kitchen Respiratory Therapy Supplies (FLUTTER) DEVI Use 2-4 times daily  . [DISCONTINUED] amoxicillin (AMOXIL) 500 MG capsule Take 1 capsule (500 mg total) by mouth 2 (two) times daily. (Patient not taking: Reported on 07/23/2014)  . [DISCONTINUED] aspirin 81 MG  tablet Take 81 mg by mouth daily.  . [DISCONTINUED] Probiotic Product (PROBIOTIC DAILY PO) Take 1 tablet by mouth daily.

## 2014-09-24 ENCOUNTER — Telehealth: Payer: Self-pay | Admitting: Pulmonary Disease

## 2014-09-24 NOTE — Telephone Encounter (Signed)
Spoke with pt. Advised her that if she is still having issues with breathing she needs to be seen before 11/11/14. She has been scheduled to see TP on 09/26/14. Nothing further was needed.

## 2014-09-26 ENCOUNTER — Encounter: Payer: Self-pay | Admitting: Adult Health

## 2014-09-26 ENCOUNTER — Ambulatory Visit (INDEPENDENT_AMBULATORY_CARE_PROVIDER_SITE_OTHER): Payer: Medicare Other | Admitting: Adult Health

## 2014-09-26 ENCOUNTER — Ambulatory Visit (INDEPENDENT_AMBULATORY_CARE_PROVIDER_SITE_OTHER)
Admission: RE | Admit: 2014-09-26 | Discharge: 2014-09-26 | Disposition: A | Discharge status: Home / Self Care | Payer: Medicare Other | Source: Ambulatory Visit | Attending: Adult Health | Admitting: Adult Health

## 2014-09-26 VITALS — BP 138/82 | HR 64 | Temp 98.0°F | Ht 63.0 in | Wt 132.0 lb

## 2014-09-26 DIAGNOSIS — J479 Bronchiectasis, uncomplicated: Secondary | ICD-10-CM

## 2014-09-26 NOTE — Progress Notes (Signed)
   Subjective:    Patient ID: Tina Bell, female    DOB: 12/27/1935, 79 y.o.   MRN: [id number]  HPI Synopsis: First referred to [facility] in 2015. Found to have bronchiectasis on high-resolution CT in 2015 this is believed to be due to a severe pertussis infection as a child. Never smoked 12/2013 CT chest high res (entrikin read)> scattered throughout the lungs, particularly in the lingula and RML, there is cylindrical bronchiectasis and bronchiolectasis with nodularity suggestive of MAI, extensive air trapping and atherosclerosis, small hiatal hernia June 2015 pulmonary function test> normal ratio, FEV1 93% predicted, no change with bronchodilator, total lung capacity normal, DLCO slightly low at 73% predicted  09/26/2014 Acute OV  Complains of 3 weeks of cough, congestion drainage and hoarseness.  Was on cruise for 3 weeks . Several passengers with URI like symptoms.  Last week of crusie symptoms started , took [place] .  Returned home with no significant improvement , took another zpack.  Cough is better but not totally resolved. Still has minimally productive cough .  No fever, discolored mucus, chest pain, orthopnea, calf pain, edema or orthopnea.  No hemoptysis   She's been using Robitussin-DM with some help. Does complain that she's had increased heartburn, taking Tums.     Review of Systems Constitutional:   No  weight loss, night sweats,  Fevers, chills, +fatigue, or  lassitude.  HEENT:   No headaches,  Difficulty swallowing,  Tooth/dental problems, or  Sore throat,                No sneezing, itching, ear ache, nasal congestion, post nasal drip,   CV:  No chest pain,  Orthopnea, PND, swelling in lower extremities, anasarca, dizziness, palpitations, syncope.   GI  No   abdominal pain, nausea, vomiting, diarrhea, change in bowel habits, loss of appetite, bloody stools.   Resp:   No chest wall deformity  Skin: no rash or lesions.  GU: no dysuria, change in color  of urine, no urgency or frequency.  No flank pain, no hematuria   MS:  No joint pain or swelling.  No decreased range of motion.  No back pain.  Psych:  No change in mood or affect. No depression or anxiety.  No memory loss.         Objective:   Physical Exam GEN: A/Ox3; pleasant , NAD, well nourished   HEENT:  [place]/AT,  EACs-clear, TMs-wnl, NOSE-clear, THROAT-clear, no lesions, no postnasal drip or exudate noted.   NECK:  Supple w/ fair ROM; no JVD; normal carotid impulses w/o bruits; no thyromegaly or nodules palpated; no lymphadenopathy.  RESP  Clear  P & A; w/o, wheezes/ rales/ or rhonchi.no accessory muscle use, no dullness to percussion  CARD:  RRR, no m/r/g  , no peripheral edema, pulses intact, no cyanosis or clubbing.  GI:   Soft & nt; nml bowel sounds; no organomegaly or masses detected.  Musco: Warm bil, no deformities or joint swelling noted.   Neuro: alert, no focal deficits noted.    Skin: Warm, no lesions or rashes         Assessment & Plan:

## 2014-09-26 NOTE — Patient Instructions (Addendum)
Chest x-ray today Mucinex  DM twice daily as needed for cough and congestion Claritin 10 mg daily as needed for drainage Hold krill oil. Prilosec 20 mg daily for 2 weeks Avoid all mint products Follow-up with Dr. Lake Bells  as planned and as needed Please contact office for sooner follow up if symptoms do not improve or worsen or seek emergency care

## 2014-09-26 NOTE — Assessment & Plan Note (Signed)
Recent mild flare with bronchitis Symptoms seem to be resolving with a residual cough Advised her on symptomatic treatment Check chest x-ray today Hold on any additional antibiotics at this time  Plan  mucinex  DM twice daily as needed for cough and congestion Claritin 10 mg daily as needed for drainage Hold krill oil. Prilosec 20 mg daily for 2 weeks Avoid all mint products Follow-up with Dr. Lake Bells  as planned and as needed Please contact office for sooner follow up if symptoms do not improve or worsen or seek emergency care

## 2014-10-01 ENCOUNTER — Telehealth: Payer: Self-pay | Admitting: Adult Health

## 2014-10-01 NOTE — Telephone Encounter (Signed)
Result Note     cxr is clear     Cont w/ ov recs     Please contact office for sooner follow up if symptoms do not improve or worsen or seek emergency care   --  I spoke with patient about results and she verbalized understanding and had no questions

## 2014-10-31 ENCOUNTER — Ambulatory Visit: Payer: Self-pay | Admitting: Pulmonary Disease

## 2014-11-11 ENCOUNTER — Ambulatory Visit: Payer: Self-pay | Admitting: Pulmonary Disease

## 2014-11-15 ENCOUNTER — Ambulatory Visit (INDEPENDENT_AMBULATORY_CARE_PROVIDER_SITE_OTHER): Payer: Medicare Other | Admitting: Pulmonary Disease

## 2014-11-15 ENCOUNTER — Encounter: Payer: Self-pay | Admitting: Pulmonary Disease

## 2014-11-15 VITALS — BP 134/72 | HR 61 | Ht 63.0 in | Wt 129.0 lb

## 2014-11-15 DIAGNOSIS — J479 Bronchiectasis, uncomplicated: Secondary | ICD-10-CM

## 2014-11-15 NOTE — Assessment & Plan Note (Signed)
She has recovered from her recent episode of bronchitis and cough. It's not clear to me why she has exacerbations of her bronchiectasis when she travels. However, it is so predictable that I have decided to just go ahead and treat her with antibiotics in the middle of all international travel.  Otherwise she continues to do well and stays very active.  Plan: -Use a Z-Pak in the middle of her trip to [place] in May -She was given a prescription for test next to take with her in case needed for cough -Follow-up in August before her next cruise

## 2014-11-15 NOTE — Patient Instructions (Signed)
Take the Zpack in the middle of your trip We will see you back in August

## 2014-11-15 NOTE — Progress Notes (Signed)
Subjective:    Patient ID: Tina Bell, female    DOB: February 01, 1936, 79 y.o.   MRN: [id number]  Synopsis: First referred to [facility] in 2015. Found to have bronchiectasis on high-resolution CT in 2015 this is believed to be due to a severe pertussis infection as a child. Never smoked 12/2013 CT chest high res (entrikin read)> scattered throughout the lungs, particularly in the lingula and RML, there is cylindrical bronchiectasis and bronchiolectasis with nodularity suggestive of MAI, extensive air trapping and atherosclerosis, small hiatal hernia June 2015 pulmonary function test> normal ratio, FEV1 93% predicted, no change with bronchodilator, total lung capacity normal, DLCO slightly low at 73% predicted  HPI  Chief Complaint  Patient presents with  . Follow-up    pt has no complaints at this time.  Pt has been taking claritin qhs to help with seasonal allergy symptoms.     Tina Bell struggled with a cough over the last few months and had to see Rexene Edison here in our office after a lengthy bout of bronchitis.  She went on a 3 week cruise and started to have the cough about 13 days into the cruise. She said that symptoms started as a sinus symptoms and then she later developed bronchitis.  She now has some allergy sympotms (post nasal drip, runny nose).  She is now feeling well.  She is going to [place] in May.    Past Medical History  Diagnosis Date  . Pneumonia 1997  . History of kidney stones [id number]  . Arthritis     knees  . Complication of anesthesia     difficulty waking up from anesthesia  . Hx of seasonal allergies      Review of Systems     Objective:   Physical Exam Filed Vitals:   11/15/14 1447  BP: 134/72  Pulse: 61  Height: 5\' 3"  (1.6 m)  Weight: 129 lb (58.514 kg)  SpO2: 99%   RA  Gen: well appearing, no acute distress HEENT: NCAT, OP clear, PULM: CTA B CV: RRR, no mgr, no JVD AB: BS+, soft, nontender, no hsm Ext: warm, no edema, no  clubbing, no cyanosis Derm: no rash or skin breakdown     Assessment & Plan:   Bronchiectasis without acute exacerbation She has recovered from her recent episode of bronchitis and cough. It's not clear to me why she has exacerbations of her bronchiectasis when she travels. However, it is so predictable that I have decided to just go ahead and treat her with antibiotics in the middle of all international travel.  Otherwise she continues to do well and stays very active.  Plan: -Use a Z-Pak in the middle of her trip to [place] in May -She was given a prescription for test next to take with her in case needed for cough -Follow-up in August before her next cruise     Updated Medication List Outpatient Encounter Prescriptions as of 11/15/2014  Medication Sig  . calcium-vitamin D (OSCAL WITH D) 500-200 MG-UNIT per tablet Take 1 tablet by mouth 2 (two) times daily.   . celecoxib (CELEBREX) 200 MG capsule Take 200 mg by mouth daily.   . cholecalciferol (VITAMIN D) 1000 UNITS tablet Take 1,000 Units by mouth daily.  Javier Docker Oil 1000 MG CAPS Take 1 capsule by mouth 2 (two) times daily.   . Multiple Vitamins-Minerals (WOMENS MULTI VITAMIN & MINERAL PO) Take by mouth daily.  Marland Kitchen Respiratory Therapy Supplies (FLUTTER) DEVI Use 2-4 times daily  .  vitamin C (ASCORBIC ACID) 500 MG tablet Take 500 mg by mouth daily.

## 2015-01-02 ENCOUNTER — Ambulatory Visit (INDEPENDENT_AMBULATORY_CARE_PROVIDER_SITE_OTHER): Payer: Medicare Other | Admitting: Pulmonary Disease

## 2015-01-02 ENCOUNTER — Encounter: Payer: Self-pay | Admitting: Pulmonary Disease

## 2015-01-02 VITALS — BP 114/66 | HR 83 | Ht 63.0 in | Wt 127.0 lb

## 2015-01-02 DIAGNOSIS — J189 Pneumonia, unspecified organism: Secondary | ICD-10-CM

## 2015-01-02 DIAGNOSIS — J470 Bronchiectasis with acute lower respiratory infection: Secondary | ICD-10-CM

## 2015-01-02 DIAGNOSIS — J948 Other specified pleural conditions: Secondary | ICD-10-CM

## 2015-01-02 DIAGNOSIS — J90 Pleural effusion, not elsewhere classified: Secondary | ICD-10-CM

## 2015-01-02 NOTE — Progress Notes (Signed)
Subjective:    Patient ID: Tina Bell, female    DOB: Dec 05, 1935, 79 y.o.   MRN: [id number]  Synopsis: First referred to [facility] in 2015. Found to have bronchiectasis on high-resolution CT in 2015 this is believed to be due to a severe pertussis infection as a child. Never smoked 12/2013 CT chest high res (entrikin read)> scattered throughout the lungs, particularly in the lingula and RML, there is cylindrical bronchiectasis and bronchiolectasis with nodularity suggestive of MAI, extensive air trapping and atherosclerosis, small hiatal hernia June 2015 pulmonary function test> normal ratio, FEV1 93% predicted, no change with bronchodilator, total lung capacity normal, DLCO slightly low at 73% predicted  HPI  Chief Complaint  Patient presents with  . Follow-up    recurrent pleural effusion per Dr. Shelia Bell.  Pt c/o fatigue, intermittent fever, R sided chest discomfort.     Tina Bell has had a rough time recently. She was scheduled to go on her trip to [place] and on that very day she developed a fever, cough, chest congestion, and mucus production. She went to see her primary care physician and she was found to have right lower lobe pneumonia. She was treated with azithromycin which did help take. However, she continues to have cough with mucus production so she was treated with another round of azithromycin. She said that this helped her symptoms in by approximately 5 days ago she was back to "90% normal ", however late in the day on Sunday, May 21 she started to have the sudden onset of increasing shortness of breath, fever, cough and right-sided chest pain. She says that she has a sharp right-sided chest pain when she takes a deep breath. She went to her primary care physician and she was found to have a right-sided pleural effusion. There is also evidence of pneumonia. She was started on Augmentin yesterday. She is come to me to evaluate the pleural effusion.  Past Medical History    Diagnosis Date  . Pneumonia 1997  . History of kidney stones [id number]  . Arthritis     knees  . Complication of anesthesia     difficulty waking up from anesthesia  . Hx of seasonal allergies      Review of Systems  Constitutional: Positive for fatigue. Negative for fever and chills.  HENT: Negative for rhinorrhea and sinus pressure.   Respiratory: Positive for cough and chest tightness. Negative for shortness of breath and wheezing.   Cardiovascular: Positive for chest pain. Negative for palpitations and leg swelling.       Objective:   Physical Exam Filed Vitals:   01/02/15 1556  BP: 114/66  Pulse: 83  Height: 5\' 3"  (1.6 m)  Weight: 127 lb (57.607 kg)  SpO2: 95%   RA  Gen: no acute distress HEENT: NCAT, OP clear, PULM: Few crackles right mid lung, diminished breath sounds right base, dullness to percussion right base, clear to auscultation otherwise CV: RRR, no mgr, no JVD AB: BS+, soft, nontender, no hsm Ext: warm, no edema, no clubbing, no cyanosis Derm: no rash or skin breakdown  May 10 and May 24 chest x-ray images personally reviewed> there is a slight right lower lobe infiltrate seen on the May 10 study and then by May 24 she has what looks like consolidation with a moderate size right-sided pleural effusion     Assessment & Plan:   CAP (community acquired pneumonia) She developed community-acquired pneumonia as clearly seen on the 12/17/2014 chest x-ray. She was treated  appropriately but despite that she developed an effusion which was present on the 01/01/2015 chest x-ray, images I reviewed today in clinic. I explained to her that this is a parapneumonic effusion and we really need to sample the fluid for 2 reasons, to ensure there is no evidence of infection as well as to help improve her symptoms.  Plan: She should continue taking the Augmentin for 7 days as prescribed We will perform a thoracentesis both diagnostic and therapeutic tomorrow at [facility] We will have her follow-up in one week in our clinic Take Aleve twice a day as needed for pain   Bronchiectasis without acute exacerbation She was instructed to make efforts at mucociliary clearance with flutter valve and guaifenesin 1200 mg twice a day   Pleural effusion, right As detailed above.     Updated Medication List Outpatient Encounter Prescriptions as of 01/02/2015  Medication Sig  . amoxicillin-clavulanate (AUGMENTIN) 500-125 MG per tablet Take 1 tablet by mouth 2 (two) times daily.  . celecoxib (CELEBREX) 200 MG capsule Take 200 mg by mouth daily.   Marland Kitchen guaiFENesin (MUCINEX) 600 MG 12 hr tablet Take 1,200 mg by mouth 2 (two) times daily.  Marland Kitchen guaiFENesin-codeine (ROBITUSSIN AC) 100-10 MG/5ML syrup Take 5 mLs by mouth 3 (three) times daily as needed for cough.  Marland Kitchen Respiratory Therapy Supplies (FLUTTER) DEVI Use 2-4 times daily  . calcium-vitamin D (OSCAL WITH D) 500-200 MG-UNIT per tablet Take 1 tablet by mouth 2 (two) times daily.   . cholecalciferol (VITAMIN D) 1000 UNITS tablet Take 1,000 Units by mouth daily.  Javier Docker Oil 1000 MG CAPS Take 1 capsule by mouth 2 (two) times daily.   . Multiple Vitamins-Minerals (WOMENS MULTI VITAMIN & MINERAL PO) Take by mouth daily.  . vitamin C (ASCORBIC ACID) 500 MG tablet Take 500 mg by mouth daily.   Facility-Administered Encounter Medications as of 01/02/2015  Medication  . levofloxacin (LEVAQUIN) IVPB 500 mg

## 2015-01-02 NOTE — Assessment & Plan Note (Signed)
She was instructed to make efforts at mucociliary clearance with flutter valve and guaifenesin 1200 mg twice a day

## 2015-01-02 NOTE — Assessment & Plan Note (Signed)
She developed community-acquired pneumonia as clearly seen on the 12/17/2014 chest x-ray. She was treated appropriately but despite that she developed an effusion which was present on the 01/01/2015 chest x-ray, images I reviewed today in clinic. I explained to her that this is a parapneumonic effusion and we really need to sample the fluid for 2 reasons, to ensure there is no evidence of infection as well as to help improve her symptoms.  Plan: She should continue taking the Augmentin for 7 days as prescribed We will perform a thoracentesis both diagnostic and therapeutic tomorrow at [facility] We will have her follow-up in one week in our clinic Take Aleve twice a day as needed for pain

## 2015-01-02 NOTE — Assessment & Plan Note (Signed)
As detailed above 

## 2015-01-02 NOTE — Patient Instructions (Signed)
Keep taking the Augmentin as prescribed Take guaifenesin 1200 mg twice a day We will arrange a thoracentesis at [facility] tomorrow morning Take Aleve twice a day for the chest pain We will have you come back in one week to follow-up with our nurse practitioner Tammy Parrett

## 2015-01-03 ENCOUNTER — Other Ambulatory Visit: Payer: Self-pay | Admitting: Pulmonary Disease

## 2015-01-03 ENCOUNTER — Ambulatory Visit (HOSPITAL_BASED_OUTPATIENT_CLINIC_OR_DEPARTMENT_OTHER)
Admission: RE | Admit: 2015-01-03 | Discharge: 2015-01-03 | Disposition: A | Discharge status: Home / Self Care | Payer: Medicare Other | Source: Ambulatory Visit | Attending: Pulmonary Disease | Admitting: Pulmonary Disease

## 2015-01-03 ENCOUNTER — Ambulatory Visit (HOSPITAL_COMMUNITY)
Admission: RE | Admit: 2015-01-03 | Discharge: 2015-01-03 | Disposition: A | Discharge status: Home / Self Care | Payer: Medicare Other | Source: Ambulatory Visit | Attending: Pulmonary Disease | Admitting: Pulmonary Disease

## 2015-01-03 ENCOUNTER — Inpatient Hospital Stay (HOSPITAL_COMMUNITY)
Admission: AD | Admit: 2015-01-03 | Discharge: 2015-01-11 | DRG: 163 | Disposition: A | Discharge status: Home Health | Payer: Medicare Other | Source: Ambulatory Visit | Attending: Cardiothoracic Surgery | Admitting: Cardiothoracic Surgery

## 2015-01-03 ENCOUNTER — Encounter (HOSPITAL_COMMUNITY): Payer: Self-pay | Admitting: General Practice

## 2015-01-03 ENCOUNTER — Other Ambulatory Visit: Payer: Self-pay

## 2015-01-03 ENCOUNTER — Ambulatory Visit (HOSPITAL_COMMUNITY)
Admission: RE | Admit: 2015-01-03 | Discharge: 2015-01-03 | Disposition: A | Discharge status: Home / Self Care | Payer: Medicare Other | Source: Ambulatory Visit | Attending: Radiology | Admitting: Radiology

## 2015-01-03 DIAGNOSIS — J869 Pyothorax without fistula: Secondary | ICD-10-CM | POA: Diagnosis not present

## 2015-01-03 DIAGNOSIS — J90 Pleural effusion, not elsewhere classified: Secondary | ICD-10-CM | POA: Diagnosis present

## 2015-01-03 DIAGNOSIS — J479 Bronchiectasis, uncomplicated: Secondary | ICD-10-CM | POA: Diagnosis not present

## 2015-01-03 DIAGNOSIS — Z8701 Personal history of pneumonia (recurrent): Secondary | ICD-10-CM

## 2015-01-03 DIAGNOSIS — Z87891 Personal history of nicotine dependence: Secondary | ICD-10-CM

## 2015-01-03 DIAGNOSIS — Z9889 Other specified postprocedural states: Secondary | ICD-10-CM

## 2015-01-03 DIAGNOSIS — Z452 Encounter for adjustment and management of vascular access device: Secondary | ICD-10-CM

## 2015-01-03 DIAGNOSIS — J860 Pyothorax with fistula: Secondary | ICD-10-CM | POA: Diagnosis not present

## 2015-01-03 DIAGNOSIS — Z885 Allergy status to narcotic agent status: Secondary | ICD-10-CM

## 2015-01-03 DIAGNOSIS — Z79899 Other long term (current) drug therapy: Secondary | ICD-10-CM

## 2015-01-03 DIAGNOSIS — Z881 Allergy status to other antibiotic agents status: Secondary | ICD-10-CM

## 2015-01-03 DIAGNOSIS — J470 Bronchiectasis with acute lower respiratory infection: Secondary | ICD-10-CM | POA: Diagnosis not present

## 2015-01-03 DIAGNOSIS — J948 Other specified pleural conditions: Secondary | ICD-10-CM

## 2015-01-03 DIAGNOSIS — J189 Pneumonia, unspecified organism: Principal | ICD-10-CM | POA: Diagnosis present

## 2015-01-03 DIAGNOSIS — R0602 Shortness of breath: Secondary | ICD-10-CM

## 2015-01-03 DIAGNOSIS — J939 Pneumothorax, unspecified: Secondary | ICD-10-CM

## 2015-01-03 DIAGNOSIS — Z419 Encounter for procedure for purposes other than remedying health state, unspecified: Secondary | ICD-10-CM

## 2015-01-03 DIAGNOSIS — Z9689 Presence of other specified functional implants: Secondary | ICD-10-CM

## 2015-01-03 LAB — GLUCOSE, SEROUS FLUID: Glucose, Fluid: 58 mg/dL

## 2015-01-03 LAB — BODY FLUID CELL COUNT WITH DIFFERENTIAL
Eos, Fluid: 0 %
Lymphs, Fluid: 73 %
Monocyte-Macrophage-Serous Fluid: 2 % — ABNORMAL LOW (ref 50–90)
Neutrophil Count, Fluid: 25 % (ref 0–25)
Total Nucleated Cell Count, Fluid: 655 cu mm (ref 0–1000)

## 2015-01-03 LAB — LACTATE DEHYDROGENASE, PLEURAL OR PERITONEAL FLUID: LD, Fluid: 489 U/L — ABNORMAL HIGH (ref 3–23)

## 2015-01-03 LAB — BASIC METABOLIC PANEL
Anion gap: 10 (ref 5–15)
BUN: 17 mg/dL (ref 6–20)
CO2: 27 mmol/L (ref 22–32)
Calcium: 9.3 mg/dL (ref 8.9–10.3)
Chloride: 103 mmol/L (ref 101–111)
Creatinine, Ser: 0.69 mg/dL (ref 0.44–1.00)
GFR calc Af Amer: >60 mL/min (ref >60)
GFR calc non Af Amer: >60 mL/min (ref >60)
Glucose, Bld: 102 mg/dL — ABNORMAL HIGH (ref 65–99)
Potassium: 3.8 mmol/L (ref 3.5–5.1)
Sodium: 140 mmol/L (ref 135–145)

## 2015-01-03 LAB — PROTEIN, BODY FLUID: Total protein, fluid: 4.6 g/dL

## 2015-01-03 LAB — CBC WITH DIFFERENTIAL/PLATELET
Basophils Absolute: 0 10*3/uL (ref 0.0–0.1)
Basophils Relative: 1 % (ref 0–1)
Eosinophils Absolute: 0.2 10*3/uL (ref 0.0–0.7)
Eosinophils Relative: 2 % (ref 0–5)
HCT: 35.1 % — ABNORMAL LOW (ref 36.0–46.0)
Hemoglobin: 11.4 g/dL — ABNORMAL LOW (ref 12.0–15.0)
Lymphocytes Relative: 18 % (ref 12–46)
Lymphs Abs: 1.6 10*3/uL (ref 0.7–4.0)
MCH: 27.6 pg (ref 26.0–34.0)
MCHC: 32.5 g/dL (ref 30.0–36.0)
MCV: 85 fL (ref 78.0–100.0)
Monocytes Absolute: 0.7 10*3/uL (ref 0.1–1.0)
Monocytes Relative: 8 % (ref 3–12)
Neutro Abs: 6.1 10*3/uL (ref 1.7–7.7)
Neutrophils Relative %: 71 % (ref 43–77)
Platelets: 443 10*3/uL — ABNORMAL HIGH (ref 150–400)
RBC: 4.13 MIL/uL (ref 3.87–5.11)
RDW: 13.7 % (ref 11.5–15.5)
WBC: 8.5 10*3/uL (ref 4.0–10.5)

## 2015-01-03 MED ORDER — GUAIFENESIN-CODEINE 100-10 MG/5ML PO SOLN: 5.0000 mL | Freq: Three times a day (TID) | ORAL | Status: DC | PRN

## 2015-01-03 MED ORDER — CELECOXIB 200 MG PO CAPS
200.0000 mg | ORAL_CAPSULE | Freq: Every day | ORAL | Status: DC
  Filled 2015-01-03 (×3): qty 1

## 2015-01-03 MED ORDER — GUAIFENESIN ER 600 MG PO TB12
1200.0000 mg | ORAL_TABLET | Freq: Two times a day (BID) | ORAL | Status: DC
  Administered 2015-01-03 – 2015-01-08 (×4): 1200 mg via ORAL
  Filled 2015-01-03 (×11): qty 2

## 2015-01-03 MED ORDER — SODIUM CHLORIDE 0.9 % IJ SOLN
3.0000 mL | Freq: Two times a day (BID) | INTRAMUSCULAR | Status: DC
  Administered 2015-01-04: 3 mL via INTRAVENOUS

## 2015-01-03 MED ORDER — VITAMIN D3 25 MCG (1000 UNIT) PO TABS
1000.0000 [IU] | ORAL_TABLET | Freq: Every day | ORAL | Status: DC
  Administered 2015-01-08 – 2015-01-11 (×3): 1000 [IU] via ORAL
  Filled 2015-01-03 (×7): qty 1

## 2015-01-03 MED ORDER — SODIUM CHLORIDE 0.9 % IJ SOLN: 3.0000 mL | INTRAMUSCULAR | Status: DC | PRN

## 2015-01-03 MED ORDER — ENOXAPARIN SODIUM 40 MG/0.4ML ~~LOC~~ SOLN
40.0000 mg | SUBCUTANEOUS | Status: DC
  Administered 2015-01-03 – 2015-01-04 (×2): 40 mg via SUBCUTANEOUS
  Filled 2015-01-03 (×2): qty 0.4

## 2015-01-03 MED ORDER — ONDANSETRON HCL 4 MG/2ML IJ SOLN
4.0000 mg | Freq: Four times a day (QID) | INTRAMUSCULAR | Status: DC | PRN
Start: 2015-01-03 — End: 2015-01-11

## 2015-01-03 MED ORDER — LEVOFLOXACIN IN D5W 750 MG/150ML IV SOLN
750.0000 mg | INTRAVENOUS | Status: DC
  Administered 2015-01-03 – 2015-01-05 (×3): 750 mg via INTRAVENOUS
  Filled 2015-01-03 (×3): qty 150

## 2015-01-03 MED ORDER — SODIUM CHLORIDE 0.9 % IV SOLN: 250.0000 mL | INTRAVENOUS | Status: DC | PRN

## 2015-01-03 MED ORDER — ACETAMINOPHEN 325 MG PO TABS
650.0000 mg | ORAL_TABLET | ORAL | Status: DC | PRN
  Administered 2015-01-03 – 2015-01-05 (×6): 650 mg via ORAL
  Filled 2015-01-03 (×6): qty 2

## 2015-01-03 MED ORDER — LIDOCAINE HCL (PF) 1 % IJ SOLN
INTRAMUSCULAR | Status: AC
  Filled 2015-01-03: qty 10

## 2015-01-03 MED ORDER — CALCIUM CARBONATE-VITAMIN D 500-200 MG-UNIT PO TABS
1.0000 | ORAL_TABLET | Freq: Two times a day (BID) | ORAL | Status: DC
  Administered 2015-01-03 – 2015-01-11 (×11): 1 via ORAL
  Filled 2015-01-03 (×16): qty 1

## 2015-01-03 NOTE — Progress Notes (Signed)
Thoracentesis performed.  Patient tolerated well.    Initial vital signs:  HR 93, SpO2 95%, RR 20, BP 145/85  Vitals remained stable throughout procedure.  No complications noted.  Discharge vitals:  80, 94%, RR 18, BP 121/77

## 2015-01-03 NOTE — H&P (Signed)
[facility]  Anyra was seen yesterday in clinic and presented with a new pleural effusion.  She has a new right sided pleural effusion.  Her exam and history have not changed today.  Plan to proceed with thoracentesis  Roselie Awkward, MD [facility] Pager: [phone] Cell: [phone] After 3pm or if no response, call [phone]

## 2015-01-03 NOTE — Procedures (Signed)
Thoracentesis Procedure Note  Pre-operative Diagnosis: right sided pleural effusion  Post-operative Diagnosis: normal  Indications: Community Acquired Pneumonia followed by new right sided pleural effusion  Procedure Details  Consent: Informed consent was obtained. Risks of the procedure were discussed including: infection, bleeding, pain, pneumothorax.  Under sterile conditions the patient was positioned. Betadine solution and sterile drapes were utilized.  1% buffered lidocaine was used to anesthetize the 9th rib space. Fluid was obtained without any difficulties and minimal blood loss.  A dressing was applied to the wound and wound care instructions were provided.   Findings Ultrasound was used to identify a pocket of fluid in the right chest that was small in size with nearby atelectasis.  Only 3 cc of slightly blood tinged pleural fluid could be obtained. No fluid could be aspirated from the catheter, so the catheter was removed and the procedure was performed with a sterile 21 gauge needle.   Complications: none, she tolerated the procedure well        Condition: stable  Plan A CT scan of the chest was performed to further evaluate the pleural effusion to see if it appeared loculated.  She may require admission and IV Antibiotics and possible thoracic surgery consultation, depending on the result of the CT scan. Bed Rest for 0 hours. Tylenol 650 mg. for pain.  Attending Attestation: I performed the procedure.    Roselie Awkward, MD [facility] Pager: [phone] Cell: [phone] After 3pm or if no response, call [phone]

## 2015-01-03 NOTE — H&P (Signed)
PULMONARY / CRITICAL CARE MEDICINE   Name: Tina Bell MRN: [id number] DOB: July 11, 1936    ADMISSION DATE:  (Not on file) CONSULTATION DATE:  01/03/2015  REFERRING MD :  Lake Bells  CHIEF COMPLAINT:  Chest pain shortness of breath  INITIAL PRESENTATION: 79 year old female with a history of bronchiectasis developed community-acquired pneumonia in May 2016 and presented to the pulmonary office on May 26 with a new right-sided pleural effusion. Thoracentesis was attempted but no fluid could be removed. She was admitted for further management  STUDIES:  01/03/2015 CT chest images personally reviewed, radiology report pending> there is consolidation and it least a moderate sized pleural effusion on the right side some bronchiectasis noted  SIGNIFICANT EVENTS:    HISTORY OF PRESENT ILLNESS:  This is a very pleasant 79 year old female whom I followed in the office for over a year now for bronchiectasis who is being admitted for a right sided pleural effusion. She stated that on May 10 she started to have cough, fever, chills, and mucus production. She went to her primary care physician and was found to have right lower lobe pneumonia. She was treated with a Z-Pak but unfortunately her symptoms persisted so she was given a second Z-Pak. By this point her symptoms did start to improve and by May 21 she was back to "90% normal". However later that day she started to feel worse with increasing shortness of breath right-sided chest pain which was sharp in nature and worse on inspiration, and recurrence of cough. She also had some fever at that time. She went back to her primary care physician later that week and was found to have a new right-sided pleural effusion. She was prescribed Augmentin. She presented to my office on 01/02/2015 for further evaluation. I had her come to the hospital on 01/03/2015 for thoracentesis. There I was able to identify some fluid under ultrasound but minimal fluid was able to  be removed. I ordered a CT chest which showed consolidation and a right lower lobe effusion.  PAST MEDICAL HISTORY :   has a past medical history of Pneumonia (1997); History of kidney stones (6283,1517); Arthritis; Complication of anesthesia; and seasonal allergies.  has past surgical history that includes Bladder suspension (1998); Knee arthroscopy (Right, 1999); Nose surgery (1989); and Dilation and curettage of uterus. Prior to Admission medications   Medication Sig Start Date End Date Taking? Authorizing Provider  amoxicillin-clavulanate (AUGMENTIN) 500-125 MG per tablet Take 1 tablet by mouth 2 (two) times daily.    Historical Provider, MD  calcium-vitamin D (OSCAL WITH D) 500-200 MG-UNIT per tablet Take 1 tablet by mouth 2 (two) times daily.     Historical Provider, MD  celecoxib (CELEBREX) 200 MG capsule Take 200 mg by mouth daily.     Historical Provider, MD  cholecalciferol (VITAMIN D) 1000 UNITS tablet Take 1,000 Units by mouth daily.    Historical Provider, MD  guaiFENesin (MUCINEX) 600 MG 12 hr tablet Take 1,200 mg by mouth 2 (two) times daily.    Historical Provider, MD  guaiFENesin-codeine (ROBITUSSIN AC) 100-10 MG/5ML syrup Take 5 mLs by mouth 3 (three) times daily as needed for cough.    Historical Provider, MD  Javier Docker Oil 1000 MG CAPS Take 1 capsule by mouth 2 (two) times daily.     Historical Provider, MD  Multiple Vitamins-Minerals (WOMENS MULTI VITAMIN & MINERAL PO) Take by mouth daily.    Historical Provider, MD  Respiratory Therapy Supplies (FLUTTER) DEVI Use 2-4 times daily 07/23/14  Juanito Doom, MD  vitamin C (ASCORBIC ACID) 500 MG tablet Take 500 mg by mouth daily.    Historical Provider, MD   Allergies  Allergen Reactions  . Keflex [Cephalexin] Hives  . Morphine And Related Hives    FAMILY HISTORY:  has no family status information on file.  SOCIAL HISTORY:  reports that she quit smoking about 52 years ago. Her smoking use included Cigarettes. She has a 1  pack-year smoking history. She has never used smokeless tobacco. She reports that she drinks alcohol. She reports that she does not use illicit drugs.  REVIEW OF SYSTEMS:   Gen: Denies fever, chills, weight change, + fatigue, night sweats HEENT: Denies blurred vision, double vision, hearing loss, tinnitus, sinus congestion, rhinorrhea, sore throat, neck stiffness, dysphagia PULM: Per history of present illness CV: Denies chest pain, edema, orthopnea, paroxysmal nocturnal dyspnea, palpitations GI: Denies abdominal pain, nausea, vomiting, diarrhea, hematochezia, melena, constipation, change in bowel habits GU: Denies dysuria, hematuria, polyuria, oliguria, urethral discharge Endocrine: Denies hot or cold intolerance, polyuria, polyphagia or appetite change Derm: Denies rash, dry skin, scaling or peeling skin change Heme: Denies easy bruising, bleeding, bleeding gums Neuro: Denies headache, numbness, weakness, slurred speech, loss of memory or consciousness   SUBJECTIVE:   VITAL SIGNS: Pulse Rate:  [83] 83 (05/26 1556) BP: (114)/(66) 114/66 mmHg (05/26 1556) SpO2:  [95 %] 95 % (05/26 1556) Weight:  [57.607 kg (127 lb)] 57.607 kg (127 lb) (05/26 1556) HEMODYNAMICS:   VENTILATOR SETTINGS:   INTAKE / OUTPUT: No intake or output data in the 24 hours ending 01/03/15 1414  PHYSICAL EXAMINATION: General:  Mild distress from pain Neuro:  Alert and oriented 4, moves all 4 extremities HEENT:  NCAT, oropharynx clear, neck supple Cardiovascular:  Regular rate and rhythm no murmurs gallops or rubs Lungs:  Diminished breath sounds and dullness to percussion right base, otherwise clear Abdomen:  Bowel sounds positive, nontender nondistended Musculoskeletal:  Normal bulk and tone Skin:  No rash or skin breakdown  LABS:  CBC No results for input(s): WBC, HGB, HCT, PLT in the last 168 hours. Coag's No results for input(s): APTT, INR in the last 168 hours. BMET No results for input(s): NA,  K, CL, CO2, BUN, CREATININE, GLUCOSE in the last 168 hours. Electrolytes No results for input(s): CALCIUM, MG, PHOS in the last 168 hours. Sepsis Markers No results for input(s): LATICACIDVEN, PROCALCITON, O2SATVEN in the last 168 hours. ABG No results for input(s): PHART, PCO2ART, PO2ART in the last 168 hours. Liver Enzymes No results for input(s): AST, ALT, ALKPHOS, BILITOT, ALBUMIN in the last 168 hours. Cardiac Enzymes No results for input(s): TROPONINI, PROBNP in the last 168 hours. Glucose No results for input(s): GLUCAP in the last 168 hours.  Imaging Ct Chest Wo Contrast  01/03/2015   CLINICAL DATA:  Patient with history of pneumonia status post thoracentesis this morning. Only a small amount of fluid was able to be aspirated.  EXAM: CT CHEST WITHOUT CONTRAST  TECHNIQUE: Multidetector CT imaging of the chest was performed following the standard protocol without IV contrast.  COMPARISON:  Chest radiograph 01/01/2015; 12/17/2014  FINDINGS: Mediastinum/Nodes: Visualized thyroid is unremarkable. No enlarged axillary, mediastinal or hilar lymphadenopathy. Heart is normal in size. The ascending thoracic aorta is dilated measuring 4 cm. Main pulmonary artery is unremarkable.  Lungs/Pleura: Central airways are patent. Dense consolidative opacification within the right lower lobe. Moderate loculated right pleural effusion. There are multiple scattered tree-in-bud nodular opacities demonstrated particularly within the right upper  and right middle lobes with a few scattered nodules within the left lower lobe. Mild atelectasis within the lingula. No pneumothorax.  Upper abdomen: The adrenal glands are unremarkable. The noncontrast enhanced appearance of the visualized liver is unremarkable.  Musculoskeletal: No aggressive or acute appearing osseous lesions.  IMPRESSION: Large consolidative pulmonary opacity within the right lower lobe. There is an associated loculated moderate right pleural effusion  within the lower right hemithorax. Overall findings are concerning for pneumonia and parapneumonic effusion.  Scattered tree-in-bud nodular opacities within the right upper and middle lobes as well as left lower lobe. Findings are likely infectious or inflammatory in etiology.  Recommend continued radiographic followup to ensure resolution.  Ascending thoracic aorta measures 4 cm. Recommend semi-annual imaging followup by CTA or MRA and referral to cardiothoracic surgery if not already obtained. This recommendation follows 2010 ACCF/AHA/AATS/ACR/ASA/SCA/SCAI/SIR/STS/SVM Guidelines for the Diagnosis and Management of Patients With Thoracic Aortic Disease. Circulation. 2010; 121: S568-L27   Electronically Signed   By: Lovey Newcomer M.D.   On: 01/03/2015 13:58     ASSESSMENT / PLAN:  PULMONARY A: Community-acquired pneumonia which has failed over 2 courses of oral anabolic scan now associated with a right-sided parapneumonic effusion. The differential diagnosis of the parapneumonic effusion is a simple exudate versus empyema. It does appear somewhat simple on CT chest but unfortunately no fluid could be removed with a thoracentesis today. Chronic idiopathic bronchiectasis not an exacerbation P:   Admit to the hospital for IV anabolic for treatment of pneumonia Have ultrasound look to try to perform a diagnostic and therapeutic thoracentesis and send fluid for analysis Tylenol when necessary chest pain O2 as needed to maintain O2 saturation greater than 90% Continue baseline mucociliary clearance measures with a flutter valve twice a day, guaifenesin May need thoracic surgery evaluation depending on findings from ultrasound-guided thoracentesis today, I have discussed this with the radiology team and they will perform the thoracentesis now if fluid available   CARDIOVASCULAR A: No acute issues P:  Monitor vital signs per routine  RENAL A:  No acute issues P:   Monitor BMET and UOP Replace  electrolytes as needed  GASTROINTESTINAL A:  No acute issues P:   Regular diet  HEMATOLOGIC A:  No acute issues P:  Monitor for bleeding  INFECTIOUS A:  Community-acquired pneumonia as detailed above P:   BCx2 01/03/2015>  Abx: Levaquin IV 750 mg daily, start date May 27, day 1 of 7  ENDOCRINE A:  No acute issues P:   Monitor glucose  NEUROLOGIC A:  No acute issues P:     FAMILY  - Updates: Has been updated in the radiology department by me personally  Roselie Awkward, MD [facility] Pager: [phone] Cell: [phone] After 3pm or if no response, call [phone]] 01/03/2015, 2:14 PM

## 2015-01-03 NOTE — Progress Notes (Signed)
ANTIBIOTIC CONSULT NOTE - INITIAL  Pharmacy Consult for levaquin Indication: pneumonia  Allergies  Allergen Reactions  . Keflex [Cephalexin] Hives  . Morphine And Related Hives    Patient Measurements:   Adjusted Body Weight:   Vital Signs: BP: 121/59 mmHg (05/27 1519) Intake/Output from previous day:   Intake/Output from this shift:    Labs: No results for input(s): WBC, HGB, PLT, LABCREA, CREATININE in the last 72 hours. CrCl cannot be calculated (Patient has no serum creatinine result on file.). No results for input(s): VANCOTROUGH, VANCOPEAK, VANCORANDOM, GENTTROUGH, GENTPEAK, GENTRANDOM, TOBRATROUGH, TOBRAPEAK, TOBRARND, AMIKACINPEAK, AMIKACINTROU, AMIKACIN in the last 72 hours.   Microbiology: No results found for this or any previous visit (from the past 720 hour(s)).  Medical History: Past Medical History  Diagnosis Date  . Pneumonia 1997  . History of kidney stones [id number]  . Arthritis     knees  . Complication of anesthesia     difficulty waking up from anesthesia  . Hx of seasonal allergies     Medications:  Scheduled:  . calcium-vitamin D  1 tablet Oral BID  . celecoxib  200 mg Oral Daily  . cholecalciferol  1,000 Units Oral Daily  . enoxaparin (LOVENOX) injection  40 mg Subcutaneous Q24H  . guaiFENesin  1,200 mg Oral BID  . levofloxacin (LEVAQUIN) IV  750 mg Intravenous Q24H  . sodium chloride  3 mL Intravenous Q12H   Infusions:   Assessment: Pt was recently treated for CAP but then developed pleural effusion. Presented today for thoracentesis. Placed on levaquin and plan is for 7 days.   Plan:   Levaquin 750mg  IV q24 Bmet in AM  Onnie Boer, PharmD Pager: [phone] 01/03/2015 4:17 PM

## 2015-01-03 NOTE — Procedures (Signed)
Successful US guided right thoracentesis. Yielded 25 ml of blood tinged fluid. Pt tolerated procedure well. No immediate complications. Complex loculated right pleural effusion with difficulty aspirating fluid.   Specimen was sent for labs. CXR ordered.  Tsosie Billing D PA-C 01/03/2015 3:24 PM

## 2015-01-04 ENCOUNTER — Inpatient Hospital Stay (HOSPITAL_COMMUNITY): Payer: Medicare Other

## 2015-01-04 DIAGNOSIS — J189 Pneumonia, unspecified organism: Principal | ICD-10-CM

## 2015-01-04 DIAGNOSIS — J948 Other specified pleural conditions: Secondary | ICD-10-CM

## 2015-01-04 DIAGNOSIS — J869 Pyothorax without fistula: Secondary | ICD-10-CM

## 2015-01-04 LAB — BASIC METABOLIC PANEL
Anion gap: 9 (ref 5–15)
BUN: 17 mg/dL (ref 6–20)
CO2: 25 mmol/L (ref 22–32)
Calcium: 9.1 mg/dL (ref 8.9–10.3)
Chloride: 105 mmol/L (ref 101–111)
Creatinine, Ser: 0.78 mg/dL (ref 0.44–1.00)
GFR calc Af Amer: >60 mL/min (ref >60)
GFR calc non Af Amer: >60 mL/min (ref >60)
Glucose, Bld: 97 mg/dL (ref 65–99)
Potassium: 4.1 mmol/L (ref 3.5–5.1)
Sodium: 139 mmol/L (ref 135–145)

## 2015-01-04 LAB — CBC
HCT: 33.5 % — ABNORMAL LOW (ref 36.0–46.0)
Hemoglobin: 10.7 g/dL — ABNORMAL LOW (ref 12.0–15.0)
MCH: 26.9 pg (ref 26.0–34.0)
MCHC: 31.9 g/dL (ref 30.0–36.0)
MCV: 84.2 fL (ref 78.0–100.0)
Platelets: 392 10*3/uL (ref 150–400)
RBC: 3.98 MIL/uL (ref 3.87–5.11)
RDW: 13.5 % (ref 11.5–15.5)
WBC: 8.1 10*3/uL (ref 4.0–10.5)

## 2015-01-04 LAB — PH, BODY FLUID: pH, Fluid: 8

## 2015-01-04 NOTE — Consult Note (Signed)
[address]       ,[place] [address]             [phone]        Tina Bell Drayton Medical Record #[id number] Date of Birth: 1936/04/17  Referring: Deland Pretty, MD Primary Care: Horatio Pel, MD  Chief Complaint:  Cough, shortness of breath, chest pain  History of Present Illness:     Patient examined, chest x-ray and CT scan of chest images personally reviewed  79 year old Caucasian female nonsmoker with history of bronchiectasis has been treated for a  respiratory infection with  oral anabiotic's for the past few weeks--Z-Pak 2 then Augmentin. However her symptoms of cough shortness breath chest pain and malaise have progressed. Most recent chest x-ray shows a new right pleural effusion area attempt at thoracentesis including ultrasound-guided thoracentesis was unable to remove any fluid. A CT scan of the chest shows the effusion to be loculated and possibly an empyema. She was admitted to the hospital for IV antibody syndrome placed on IV Levaquin. She has had no fever in the past 48 hours and her white count has been normal 8000. Because of the evidence of right empyema thoracic surgical evaluation was requested.  Current Activity/ Functional Status: Patient is retired but very active and was planning a trip overseas until she became ill earlier this month.   Zubrod Score: At the time of surgery this patient's most appropriate activity status/level should be described as: []     0    Normal activity, no symptoms []     1    Restricted in physical strenuous activity but ambulatory, able to do out light work [x]     2    Ambulatory and capable of self care, unable to do work activities, up and about                 more than 50%  Of the time                            []     3    Only limited self care, in bed greater than 50% of waking hours []     4    Completely disabled, no self care, confined to bed or chair []     5    Moribund  Past  Medical History  Diagnosis Date  . Pneumonia 1997  . History of kidney stones 0814,4818  . Arthritis     knees  . Complication of anesthesia     difficulty waking up from anesthesia  . Hx of seasonal allergies   . Pleural effusion 12/2014    Past Surgical History  Procedure Laterality Date  . Bladder suspension  1998  . Knee arthroscopy Right 1999  . Nose surgery  1989    repair of deviated septum  . Dilation and curettage of uterus      3 procedures in the 1980's for spotting    History  Smoking status  . Former Smoker -- 0.20 packs/day for 5 years  . Types: Cigarettes  . Quit date: 05/03/1962  Smokeless tobacco  . Never Used    Comment: Smoked 1 pack/week while smoking.     History  Alcohol Use  . Yes    Comment: occasional drink    History   Social History  . Marital Status: Married    Spouse Name: N/A  . Number of Children:  N/A  . Years of Education: N/A   Occupational History  . Not on file.   Social History Main Topics  . Smoking status: Former Smoker -- 0.20 packs/day for 5 years    Types: Cigarettes    Quit date: 05/03/1962  . Smokeless tobacco: Never Used     Comment: Smoked 1 pack/week while smoking.   . Alcohol Use: Yes     Comment: occasional drink  . Drug Use: No  . Sexual Activity: Not on file   Other Topics Concern  . Not on file   Social History Narrative    Allergies  Allergen Reactions  . Keflex [Cephalexin] Hives  . Morphine And Related Hives    Current Facility-Administered Medications  Medication Dose Route Frequency Provider Last Rate Last Dose  . 0.9 %  sodium chloride infusion  250 mL Intravenous PRN Juanito Doom, MD      . 0.9 %  sodium chloride infusion  250 mL Intravenous PRN Juanito Doom, MD      . acetaminophen (TYLENOL) tablet 650 mg  650 mg Oral Q4H PRN Juanito Doom, MD   650 mg at 01/04/15 1014  . calcium-vitamin D (OSCAL WITH D) 500-200 MG-UNIT per tablet 1 tablet  1 tablet Oral BID Juanito Doom, MD   1 tablet at 01/03/15 2112  . celecoxib (CELEBREX) capsule 200 mg  200 mg Oral Daily Juanito Doom, MD   200 mg at 01/03/15 1723  . cholecalciferol (VITAMIN D) tablet 1,000 Units  1,000 Units Oral Daily Juanito Doom, MD   1,000 Units at 01/03/15 1723  . enoxaparin (LOVENOX) injection 40 mg  40 mg Subcutaneous Q24H Juanito Doom, MD   40 mg at 01/03/15 1829  . guaiFENesin (MUCINEX) 12 hr tablet 1,200 mg  1,200 mg Oral BID Juanito Doom, MD   1,200 mg at 01/03/15 2112  . guaiFENesin-codeine 100-10 MG/5ML solution 5 mL  5 mL Oral TID PRN Juanito Doom, MD      . levofloxacin (LEVAQUIN) IVPB 750 mg  750 mg Intravenous Q24H Juanito Doom, MD   750 mg at 01/03/15 1720  . ondansetron (ZOFRAN) injection 4 mg  4 mg Intravenous Q6H PRN Juanito Doom, MD      . sodium chloride 0.9 % injection 3 mL  3 mL Intravenous Q12H Juanito Doom, MD   3 mL at 01/04/15 1016  . sodium chloride 0.9 % injection 3 mL  3 mL Intravenous PRN Juanito Doom, MD       Facility-Administered Medications Ordered in Other Encounters  Medication Dose Route Frequency Provider Last Rate Last Dose  . levofloxacin (LEVAQUIN) IVPB 500 mg  500 mg Intravenous Q24H Festus Aloe, MD        Prescriptions prior to admission  Medication Sig Dispense Refill Last Dose  . amoxicillin-clavulanate (AUGMENTIN) 875-125 MG per tablet Take 1 tablet by mouth 2 (two) times daily. 7 day regimen   01/03/2015 at Unknown time  . calcium-vitamin D (OSCAL WITH D) 500-200 MG-UNIT per tablet Take 1 tablet by mouth 2 (two) times daily.    12/17/2014  . celecoxib (CELEBREX) 200 MG capsule Take 200 mg by mouth daily.    12/17/2014  . cholecalciferol (VITAMIN D) 1000 UNITS tablet Take 1,000 Units by mouth daily.   12/17/2014  . guaiFENesin (MUCINEX) 600 MG 12 hr tablet Take 1,200 mg by mouth 2 (two) times daily as needed for cough or to loosen phlegm.  01/03/2015 at Unknown time  . guaiFENesin-codeine (ROBITUSSIN  AC) 100-10 MG/5ML syrup Take 5 mLs by mouth 3 (three) times daily as needed for cough.   01/03/2015 at Unknown time  . Krill Oil 1000 MG CAPS Take 1 capsule by mouth 2 (two) times daily.    12/17/2014  . Multiple Vitamins-Minerals (WOMENS MULTI VITAMIN & MINERAL PO) Take by mouth daily.   12/17/2014  . Respiratory Therapy Supplies (FLUTTER) DEVI Use 2-4 times daily 1 each 0 unknown  . vitamin C (ASCORBIC ACID) 500 MG tablet Take 500 mg by mouth daily.   12/17/2014    Family History  Problem Relation Age of Onset  . Cancer Mother     ovarian  . Cancer Sister     breast     Review of Systems:       Cardiac Review of Systems: Y or N  Chest Pain [  yes pleuritic pain on right yes  ]  Resting SOB [  no ] Exertional SOB  [  yes]  Orthopnea [ yes ]   Pedal Edema [ no  ]    Palpitations [ no ] Syncope  [no  ]   Presyncope [ no  ]  General Review of Systems: [Y] = yes [  ]=no Constitional: recent weight change [place]  ]; anorexia [ yes ]; fatigue [place]  ]; nausea [  ]; night sweats [  ]; fever [place]  ]; or chills [place]  ]                                                               Dental: poor dentition[  ]; Last Dentist visit: Every 6 months  Eye : blurred vision [  ]; diplopia [   ]; vision changes [  ];  Amaurosis fugax[  ]; Resp: cough [ yes nocturnal dyspnea[  ]; dyspnea on exertion[ yes ]; or orthopnea[yes-more comfortable sleeping in a chair  ];  GI:  gallstones[  ], vomiting[  ];  dysphagia[  ]; melena[  ];  hematochezia [  ]; heartburn[  ];   Hx of  Colonoscopy[  ]; GU: kidney stones [  ]; hematuria[  ];   dysuria [  ];  nocturia[  ];  history of     obstruction [  ]; urinary frequency [yes history of bladder sling for incontinence  ]             Skin: rash, swelling[  ];, hair loss[  ];  peripheral edema[  ];  or itching[  ]; Musculosketetal: myalgias[  ];  joint swelling[  ];  joint erythema[  ];  joint pain[ yes-arthritis of knees ];  back pain[  ];  Heme/Lymph: bruising[  ];  bleeding[   ];  anemia[  ];  Neuro: TIA[  ];  headaches[  ];  stroke[  ];  vertigo[  ];  seizures[  ];   paresthesias[  ];  difficulty walking[  ];  Psych:depression[  ]; anxiety[  ];  Endocrine: diabetes[  ];  thyroid dysfunction[  ];  Immunizations: Flu [  ]; Pneumococcal[  ];  Other: No history of thoracic trauma or pneumothorax  Physical Exam: BP 121/62 mmHg  Pulse 97  Temp(Src) 97.1 F (36.2 C) (Oral)  Resp 16  Ht 5\' 3"  (1.6 m)  Wt 129 lb 3.2 oz (58.605 kg)  BMI 22.89 kg/m2  SpO2 97%       Physical Exam  General: Very pleasant healthy-appearing female who appears younger than her stated age in no distress HEENT: Normocephalic pupils equal , dentition adequate Neck: Supple without JVD, adenopathy, or bruit Chest: Clear to auscultation, right side with diminished basilar breath  sounds, no rhonchi, no tenderness             or deformity Cardiovascular: Regular rate and rhythm, no murmur, no gallop, peripheral pulses             palpable in all extremities Abdomen:  Soft, nontender, no palpable mass or organomegaly Extremities: Warm, well-perfused, no clubbing cyanosis edema or tenderness,              no venous stasis changes of the legs Rectal/GU: Deferred Neuro: Grossly non--focal and symmetrical throughout Skin: Clean and dry without rash or ulceration   Diagnostic Studies & Laboratory data:     Recent Radiology Findings:   Dg Chest 1 View  01/03/2015   CLINICAL DATA:  Status post right-sided thoracentesis.  EXAM: CHEST  1 VIEW  COMPARISON:  01/01/2015 and current chest CT.  FINDINGS: Opacity at the right lung base is similar to the prior chest radiograph. Although a component of this may reflect residual fluid. Patchy of this is likely consolidated lung.  There is no pneumothorax.  No left pleural effusion.  IMPRESSION: 1. No pneumothorax following right-sided thoracentesis. Lung base opacity on the right is without significant change when compared to the prior chest radiograph.    Electronically Signed   By: Lajean Manes M.D.   On: 01/03/2015 15:32   Dg Chest 2 View  01/04/2015   CLINICAL DATA:  Pneumonia, pleural effusion  EXAM: CHEST  2 VIEW  COMPARISON:  01/03/2015  FINDINGS: Aeration is improved with persistent dense right lower lobe consolidation and moderate right pleural effusion. Trace left pleural fluid. Heart size upper limits of normal. The aorta is unfolded and ectatic.  IMPRESSION: Improved overall aeration with stable right lower lobe consolidation compatible with pneumonia and moderate right pleural effusion.   Electronically Signed   By: Conchita Paris M.D.   On: 01/04/2015 09:07   Ct Chest Wo Contrast  01/03/2015   CLINICAL DATA:  Patient with history of pneumonia status post thoracentesis this morning. Only a small amount of fluid was able to be aspirated.  EXAM: CT CHEST WITHOUT CONTRAST  TECHNIQUE: Multidetector CT imaging of the chest was performed following the standard protocol without IV contrast.  COMPARISON:  Chest radiograph 01/01/2015; 12/17/2014  FINDINGS: Mediastinum/Nodes: Visualized thyroid is unremarkable. No enlarged axillary, mediastinal or hilar lymphadenopathy. Heart is normal in size. The ascending thoracic aorta is dilated measuring 4 cm. Main pulmonary artery is unremarkable.  Lungs/Pleura: Central airways are patent. Dense consolidative opacification within the right lower lobe. Moderate loculated right pleural effusion. There are multiple scattered tree-in-bud nodular opacities demonstrated particularly within the right upper and right middle lobes with a few scattered nodules within the left lower lobe. Mild atelectasis within the lingula. No pneumothorax.  Upper abdomen: The adrenal glands are unremarkable. The noncontrast enhanced appearance of the visualized liver is unremarkable.  Musculoskeletal: No aggressive or acute appearing osseous lesions.  IMPRESSION: Large consolidative pulmonary opacity within the right lower lobe. There is an  associated loculated moderate right pleural effusion within the lower right hemithorax. Overall findings are concerning for pneumonia  and parapneumonic effusion.  Scattered tree-in-bud nodular opacities within the right upper and middle lobes as well as left lower lobe. Findings are likely infectious or inflammatory in etiology.  Recommend continued radiographic followup to ensure resolution.  Ascending thoracic aorta measures 4 cm. Recommend semi-annual imaging followup by CTA or MRA and referral to cardiothoracic surgery if not already obtained. This recommendation follows 2010 ACCF/AHA/AATS/ACR/ASA/SCA/SCAI/SIR/STS/SVM Guidelines for the Diagnosis and Management of Patients With Thoracic Aortic Disease. Circulation. 2010; 121: H417-E08   Electronically Signed   By: Lovey Newcomer M.D.   On: 01/03/2015 13:58   US Thoracentesis Asp Pleural Space W/img Guide  01/03/2015   INDICATION: Symptomatic right sided complex loculated pleural effusion.  EXAM: US THORACENTESIS ASP PLEURAL SPACE W/IMG GUIDE  COMPARISON:  CT Chest 01/03/15.  MEDICATIONS: None  COMPLICATIONS: None immediate  TECHNIQUE: Informed written consent was obtained from the patient after a discussion of the risks, benefits and alternatives to treatment. A timeout was performed prior to the initiation of the procedure.  Initial ultrasound scanning demonstrates a right complex loculated pleural effusion. The lower chest was prepped and draped in the usual sterile fashion. 1% lidocaine was used for local anesthesia.  Under direct ultrasound guidance, a 19 gauge, 7-cm, Yueh catheter was introduced. An ultrasound image was saved for documentation purposes. The thoracentesis was performed. The catheter was removed and a dressing was applied. The patient tolerated the procedure well without immediate post procedural complication. The patient was escorted to have an upright chest radiograph.  FINDINGS: A total of approximately 25 ml of serous/blood tinged fluid  was removed. Requested samples were sent to the laboratory.  IMPRESSION: Successful ultrasound-guided right sided thoracentesis yielding 25 ml of pleural fluid.  Read By:  Tsosie Billing PA-C   Electronically Signed   By: Markus Daft M.D.   On: 01/03/2015 15:38     I have independently reviewed the above radiologic studies.  Recent Lab Findings: Lab Results  Component Value Date   WBC 8.1 01/04/2015   HGB 10.7* 01/04/2015   HCT 33.5* 01/04/2015   PLT 392 01/04/2015   GLUCOSE 97 01/04/2015   NA 139 01/04/2015   K 4.1 01/04/2015   CL 105 01/04/2015   CREATININE 0.78 01/04/2015   BUN 17 01/04/2015   CO2 25 01/04/2015      Assessment / Plan:     Right lower lobe pneumonia with extension into the pleural space and probable empyema  The patient's illness has been indolent in its course over the past several weeks. Right VATS with drainage and decortication of right lower lobe empyema would be necessary to resolve this illness and the patient appears to be an appropriate candidate for surgery. We'll plan on scheduled surgery in the next 48 hours. I discussed the procedure in detail the patient and her husband and she understands and agrees to proceed.       @ME1 @ 01/04/2015 4:23 PM

## 2015-01-04 NOTE — Progress Notes (Signed)
PULMONARY / CRITICAL CARE MEDICINE   Name: Tina Bell MRN: [id number] DOB: 01/24/1936    ADMISSION DATE:  01/03/2015 CONSULTATION DATE:  01/03/2015  REFERRING MD :  [place] Bells  CHIEF COMPLAINT:  Chest pain shortness of breath  INITIAL PRESENTATION: 79 year old female with a history of bronchiectasis developed community-acquired pneumonia in May 2016 and presented to the pulmonary office on May 26 with a new right-sided pleural effusion. Thoracentesis was attempted but no fluid could be removed. She was admitted for further management  STUDIES:  01/03/2015 CT chest images personally reviewed, radiology report pending> there is consolidation and it least a moderate sized pleural effusion on the right side some bronchiectasis noted  SIGNIFICANT EVENTS:    VITAL SIGNS: Temp:  [97.5 F (36.4 C)-97.9 F (36.6 C)] 97.9 F (36.6 C) (05/28 0541) Pulse Rate:  [79-90] 79 (05/28 0541) Resp:  [16-17] 17 (05/28 0541) BP: (121-133)/(49-74) 133/72 mmHg (05/28 0541) SpO2:  [95 %-97 %] 96 % (05/28 0541) Weight:  [58.1 kg (128 lb 1.4 oz)-58.605 kg (129 lb 3.2 oz)] 58.605 kg (129 lb 3.2 oz) (05/28 0541) HEMODYNAMICS:   VENTILATOR SETTINGS:   INTAKE / OUTPUT:  Intake/Output Summary (Last 24 hours) at 01/04/15 1406 Last data filed at 01/04/15 0600  Gross per 24 hour  Intake    600 ml  Output      0 ml  Net    600 ml    PHYSICAL EXAMINATION: General:  In no distress  Neuro:  Alert and oriented 4, moves all 4 extremities HEENT:  NCAT, oropharynx clear, neck supple Cardiovascular:  Regular rate and rhythm no murmurs gallops or rubs Lungs:  Diminished breath sounds and dullness to percussion right base, otherwise clear Abdomen:  Bowel sounds positive, nontender nondistended Musculoskeletal:  Normal bulk and tone Skin:  No rash or skin breakdown  LABS:  CBC  Recent Labs Lab 01/03/15 1956 01/04/15 0346  WBC 8.5 8.1  HGB 11.4* 10.7*  HCT 35.1* 33.5*  PLT 443* 392   Coag's No  results for input(s): APTT, INR in the last 168 hours. BMET  Recent Labs Lab 01/03/15 1956 01/04/15 0346  NA 140 139  K 3.8 4.1  CL 103 105  CO2 27 25  BUN 17 17  CREATININE 0.69 0.78  GLUCOSE 102* 97   Electrolytes  Recent Labs Lab 01/03/15 1956 01/04/15 0346  CALCIUM 9.3 9.1   Sepsis Markers No results for input(s): LATICACIDVEN, PROCALCITON, O2SATVEN in the last 168 hours. ABG No results for input(s): PHART, PCO2ART, PO2ART in the last 168 hours. Liver Enzymes No results for input(s): AST, ALT, ALKPHOS, BILITOT, ALBUMIN in the last 168 hours. Cardiac Enzymes No results for input(s): TROPONINI, PROBNP in the last 168 hours. Glucose No results for input(s): GLUCAP in the last 168 hours.  Imaging Dg Chest 1 View  01/03/2015   CLINICAL DATA:  Status post right-sided thoracentesis.  EXAM: CHEST  1 VIEW  COMPARISON:  01/01/2015 and current chest CT.  FINDINGS: Opacity at the right lung base is similar to the prior chest radiograph. Although a component of this may reflect residual fluid. Patchy of this is likely consolidated lung.  There is no pneumothorax.  No left pleural effusion.  IMPRESSION: 1. No pneumothorax following right-sided thoracentesis. Lung base opacity on the right is without significant change when compared to the prior chest radiograph.   Electronically Signed   By: Lajean Manes M.D.   On: 01/03/2015 15:32   Dg Chest 2 View  01/04/2015  CLINICAL DATA:  Pneumonia, pleural effusion  EXAM: CHEST  2 VIEW  COMPARISON:  01/03/2015  FINDINGS: Aeration is improved with persistent dense right lower lobe consolidation and moderate right pleural effusion. Trace left pleural fluid. Heart size upper limits of normal. The aorta is unfolded and ectatic.  IMPRESSION: Improved overall aeration with stable right lower lobe consolidation compatible with pneumonia and moderate right pleural effusion.   Electronically Signed   By: Conchita Paris M.D.   On: 01/04/2015 09:07   US  Thoracentesis Asp Pleural Space W/img Guide  01/03/2015   INDICATION: Symptomatic right sided complex loculated pleural effusion.  EXAM: US THORACENTESIS ASP PLEURAL SPACE W/IMG GUIDE  COMPARISON:  CT Chest 01/03/15.  MEDICATIONS: None  COMPLICATIONS: None immediate  TECHNIQUE: Informed written consent was obtained from the patient after a discussion of the risks, benefits and alternatives to treatment. A timeout was performed prior to the initiation of the procedure.  Initial ultrasound scanning demonstrates a right complex loculated pleural effusion. The lower chest was prepped and draped in the usual sterile fashion. 1% lidocaine was used for local anesthesia.  Under direct ultrasound guidance, a 19 gauge, 7-cm, Yueh catheter was introduced. An ultrasound image was saved for documentation purposes. The thoracentesis was performed. The catheter was removed and a dressing was applied. The patient tolerated the procedure well without immediate post procedural complication. The patient was escorted to have an upright chest radiograph.  FINDINGS: A total of approximately 25 ml of serous/blood tinged fluid was removed. Requested samples were sent to the laboratory.  IMPRESSION: Successful ultrasound-guided right sided thoracentesis yielding 25 ml of pleural fluid.  Read By:  Tsosie Billing PA-C   Electronically Signed   By: Markus Daft M.D.   On: 01/03/2015 15:38     ASSESSMENT / PLAN:  PULMONARY A: Community-acquired pneumonia which has failed over 2 courses of oral antibiotics,  scan now associated with a right-sided parapneumonic effusion. The differential diagnosis of the parapneumonic effusion is a simple exudate versus empyema. It does appear somewhat simple on CT chest but unfortunately no fluid could be removed with a thoracentesis today. Chronic idiopathic bronchiectasis not an exacerbation Prob needs a VATS vs pigtail catheter P:   D/c oximetry IV levaquin to continue Needs TCTS consult, may need a  VATS, i called Dr Prescott Gum    INFECTIOUS A:  Community-acquired pneumonia as detailed above P:   BCx2 01/03/2015> Pleural fluid cult 01/03/15>>  Abx: Levaquin IV 750 mg daily, start date May 27, day 1 of 7    FAMILY  - Updates: updated pt and husband at [place]  [phone]  Cell  [phone]  If no response or cell goes to voicemail, call beeper [phone]  01/04/2015, 2:06 PM

## 2015-01-05 ENCOUNTER — Inpatient Hospital Stay (HOSPITAL_COMMUNITY): Payer: Medicare Other

## 2015-01-05 DIAGNOSIS — J470 Bronchiectasis with acute lower respiratory infection: Secondary | ICD-10-CM

## 2015-01-05 LAB — COMPREHENSIVE METABOLIC PANEL
ALT: 30 U/L (ref 14–54)
AST: 35 U/L (ref 15–41)
Albumin: 2.5 g/dL — ABNORMAL LOW (ref 3.5–5.0)
Alkaline Phosphatase: 144 U/L — ABNORMAL HIGH (ref 38–126)
Anion gap: 7 (ref 5–15)
BUN: 16 mg/dL (ref 6–20)
CO2: 28 mmol/L (ref 22–32)
Calcium: 9 mg/dL (ref 8.9–10.3)
Chloride: 105 mmol/L (ref 101–111)
Creatinine, Ser: 0.93 mg/dL (ref 0.44–1.00)
GFR calc Af Amer: >60 mL/min (ref >60)
GFR calc non Af Amer: 58 mL/min — ABNORMAL LOW (ref >60)
Glucose, Bld: 148 mg/dL — ABNORMAL HIGH (ref 65–99)
Potassium: 3.8 mmol/L (ref 3.5–5.1)
Sodium: 140 mmol/L (ref 135–145)
Total Bilirubin: 0.3 mg/dL (ref 0.3–1.2)
Total Protein: 7 g/dL (ref 6.5–8.1)

## 2015-01-05 LAB — URINALYSIS, ROUTINE W REFLEX MICROSCOPIC
Bilirubin Urine: NEGATIVE
Glucose, UA: NEGATIVE mg/dL
Hgb urine dipstick: NEGATIVE
Ketones, ur: NEGATIVE mg/dL
Nitrite: NEGATIVE
Protein, ur: NEGATIVE mg/dL
Specific Gravity, Urine: 1.011 (ref 1.005–1.030)
Urobilinogen, UA: 0.2 mg/dL (ref 0.0–1.0)
pH: 6 (ref 5.0–8.0)

## 2015-01-05 LAB — BLOOD GAS, ARTERIAL
Acid-Base Excess: 2.6 mmol/L — ABNORMAL HIGH (ref 0.0–2.0)
Bicarbonate: 26.2 mEq/L — ABNORMAL HIGH (ref 20.0–24.0)
Drawn by: 36277
FIO2: 0.21 %
O2 Saturation: 95.2 %
Patient temperature: 98.6
TCO2: 27.4 mmol/L (ref 0–100)
pCO2 arterial: 37.5 mmHg (ref 35.0–45.0)
pH, Arterial: 7.459 — ABNORMAL HIGH (ref 7.350–7.450)
pO2, Arterial: 69.6 mmHg — ABNORMAL LOW (ref 80.0–100.0)

## 2015-01-05 LAB — CBC
HCT: 33.8 % — ABNORMAL LOW (ref 36.0–46.0)
HCT: 34 % — ABNORMAL LOW (ref 36.0–46.0)
Hemoglobin: 10.9 g/dL — ABNORMAL LOW (ref 12.0–15.0)
Hemoglobin: 10.8 g/dL — ABNORMAL LOW (ref 12.0–15.0)
MCH: 27.3 pg (ref 26.0–34.0)
MCH: 26.7 pg (ref 26.0–34.0)
MCHC: 32.2 g/dL (ref 30.0–36.0)
MCHC: 31.8 g/dL (ref 30.0–36.0)
MCV: 84.5 fL (ref 78.0–100.0)
MCV: 84.2 fL (ref 78.0–100.0)
Platelets: 439 10*3/uL — ABNORMAL HIGH (ref 150–400)
Platelets: 449 10*3/uL — ABNORMAL HIGH (ref 150–400)
RBC: 4 MIL/uL (ref 3.87–5.11)
RBC: 4.04 MIL/uL (ref 3.87–5.11)
RDW: 13.5 % (ref 11.5–15.5)
RDW: 13.3 % (ref 11.5–15.5)
WBC: 7.7 10*3/uL (ref 4.0–10.5)
WBC: 7.8 10*3/uL (ref 4.0–10.5)

## 2015-01-05 LAB — APTT: aPTT: 43 seconds — ABNORMAL HIGH (ref 24–37)

## 2015-01-05 LAB — URINE MICROSCOPIC-ADD ON

## 2015-01-05 LAB — TYPE AND SCREEN
ABO/RH(D): A POS
Antibody Screen: NEGATIVE

## 2015-01-05 LAB — PROTIME-INR
INR: 1.16 (ref 0.00–1.49)
Prothrombin Time: 15 seconds (ref 11.6–15.2)

## 2015-01-05 MED ORDER — VANCOMYCIN HCL IN DEXTROSE 1-5 GM/200ML-% IV SOLN
1000.0000 mg | INTRAVENOUS | Status: AC
  Administered 2015-01-06: 1000 mg via INTRAVENOUS
  Filled 2015-01-05: qty 200

## 2015-01-05 NOTE — Progress Notes (Addendum)
PULMONARY / CRITICAL CARE MEDICINE   Name: Tina Bell MRN: [id number] DOB: 17-Jul-1936    ADMISSION DATE:  01/03/2015 CONSULTATION DATE:  01/03/2015  REFERRING MD :  Lake Bells  CHIEF COMPLAINT:  Chest pain shortness of breath  INITIAL PRESENTATION: 79 year old female with a history of bronchiectasis developed community-acquired pneumonia in May 2016 and presented to the pulmonary office on May 26 with a new right-sided pleural effusion. Thoracentesis was attempted but no fluid could be removed. She was admitted for further management  STUDIES:  01/03/2015 CT chest images personally reviewed, radiology report pending> there is consolidation and it least a moderate sized pleural effusion on the right side some bronchiectasis noted  SIGNIFICANT EVENTS:  Subjective: Doing well, no c/o .  Ready for OR in AM 3/30  VITAL SIGNS: Temp:  [97.1 F (36.2 C)-98.4 F (36.9 C)] 98 F (36.7 C) (05/29 0552) Pulse Rate:  [68-97] 68 (05/29 0552) Resp:  [16] 16 (05/29 0552) BP: (121-128)/(61-75) 123/75 mmHg (05/29 0552) SpO2:  [94 %-97 %] 96 % (05/29 0552) Weight:  [58.196 kg (128 lb 4.8 oz)] 58.196 kg (128 lb 4.8 oz) (05/29 0552)    INTAKE / OUTPUT:  Intake/Output Summary (Last 24 hours) at 01/05/15 1116 Last data filed at 01/05/15 0830  Gross per 24 hour  Intake    600 ml  Output      0 ml  Net    600 ml    PHYSICAL EXAMINATION: General:  In no distress  Neuro:  Alert and oriented 4, moves all 4 extremities HEENT:  NCAT, oropharynx clear, neck supple Cardiovascular:  Regular rate and rhythm no murmurs gallops or rubs Lungs:  Diminished breath sounds and dullness to percussion right base, otherwise clear Abdomen:  Bowel sounds positive, nontender nondistended Musculoskeletal:  Normal bulk and tone Skin:  No rash or skin breakdown  LABS:  CBC  Recent Labs Lab 01/03/15 1956 01/04/15 0346 01/05/15 0417  WBC 8.5 8.1 7.7  HGB 11.4* 10.7* 10.9*  HCT 35.1* 33.5* 33.8*  PLT  443* 392 439*   Coag's No results for input(s): APTT, INR in the last 168 hours. BMET  Recent Labs Lab 01/03/15 1956 01/04/15 0346  NA 140 139  K 3.8 4.1  CL 103 105  CO2 27 25  BUN 17 17  CREATININE 0.69 0.78  GLUCOSE 102* 97   Electrolytes  Recent Labs Lab 01/03/15 1956 01/04/15 0346  CALCIUM 9.3 9.1   Sepsis Markers No results for input(s): LATICACIDVEN, PROCALCITON, O2SATVEN in the last 168 hours. ABG No results for input(s): PHART, PCO2ART, PO2ART in the last 168 hours. Liver Enzymes No results for input(s): AST, ALT, ALKPHOS, BILITOT, ALBUMIN in the last 168 hours. Cardiac Enzymes No results for input(s): TROPONINI, PROBNP in the last 168 hours. Glucose No results for input(s): GLUCAP in the last 168 hours.  Imaging Dg Chest 2 View  01/05/2015   CLINICAL DATA:  Followup right pleural effusion.  Subsequent exam.  EXAM: CHEST  2 VIEW  COMPARISON:  01/04/2015.  FINDINGS: Right pleural effusion appears mildly decreased in size from the lateral view. There is a minimal left pleural effusion. Associated right lower lobe opacity and mild posterior right middle lobe opacity are stable. There is a interstitial thickening and mild tree-in-bud type opacities noted in the right upper lobe as well as a right middle and lower lobes.  No new areas of lung opacity.  Mild scarring noted at the apices.  Cardiac silhouette is normal size.  No mediastinal  hilar masses.  IMPRESSION: 1. Mild decrease in the right pleural effusions the previous day's study. 2. No other change. Persistent right lower lobe right middle lobe consolidation or are persistent headaches tree-in-bud type right lung opacities. No new lung abnormalities.   Electronically Signed   By: Lajean Manes M.D.   On: 01/05/2015 09:19     ASSESSMENT / PLAN:  PULMONARY A: Community-acquired pneumonia which has failed over 2 courses of oral antibiotics,  scan now associated with a right-sided parapneumonic effusion. The  differential diagnosis of the parapneumonic effusion is a simple exudate versus empyema. It does appear somewhat simple on CT chest but unfortunately no fluid could be removed with a thoracentesis today. Chronic idiopathic bronchiectasis not an exacerbation For VATS 5/30 per Dr Prescott Gum   P:   D/c oximetry IV levaquin to continue For OR in AM per TCTS dr Prescott Gum for Vats  D/c celebrex  INFECTIOUS A:  Community-acquired pneumonia as detailed above P:   BCx2 01/03/2015> Pleural fluid cult 01/03/15>>  Abx: Levaquin IV 750 mg daily, start date May 27, day 3 of 7    FAMILY  - Updates: updated pt and husband at [place]  [phone]  Cell  [phone]  If no response or cell goes to voicemail, call beeper [phone]  01/05/2015, 11:16 AM

## 2015-01-05 NOTE — Progress Notes (Signed)
Procedure(s) (LRB): VIDEO ASSISTED THORACOSCOPY (VATS)/EMPYEMA AND DRAINAGE OF EMPYEMA (Right) Subjective: CXR today pweronally reviewed - persistent RLL infiltrate and loculated effusion/ empyemastable  Objective: Vital signs in last 24 hours: Temp:  [98 F (36.7 C)-98.4 F (36.9 C)] 98 F (36.7 C) (05/29 1431) Pulse Rate:  [68-82] 82 (05/29 1431) Cardiac Rhythm:  [-]  Resp:  [16] 16 (05/29 1431) BP: (105-128)/(61-75) 105/66 mmHg (05/29 1431) SpO2:  [94 %-97 %] 97 % (05/29 1431) Weight:  [128 lb 4.8 oz (58.196 kg)] 128 lb 4.8 oz (58.196 kg) (05/29 0552)  Hemodynamic parameters for last 24 hours:    Intake/Output from previous day: 05/28 0701 - 05/29 0700 In: 360 [P.O.:360] Out: -  Intake/Output this shift: Total I/O In: 480 [P.O.:480] Out: -   EXAM  Alert and comfortable Decreased breath sounds R  base  Lab Results:  Recent Labs  01/04/15 0346 01/05/15 0417  WBC 8.1 7.7  HGB 10.7* 10.9*  HCT 33.5* 33.8*  PLT 392 439*   BMET:  Recent Labs  01/03/15 1956 01/04/15 0346  NA 140 139  K 3.8 4.1  CL 103 105  CO2 27 25  GLUCOSE 102* 97  BUN 17 17  CREATININE 0.69 0.78  CALCIUM 9.3 9.1    PT/INR: No results for input(s): LABPROT, INR in the last 72 hours. ABG No results found for: PHART, HCO3, TCO2, ACIDBASEDEF, O2SAT CBG (last 3)  No results for input(s): GLUCAP in the last 72 hours.  Assessment/Plan: S/P Procedure(s) (LRB): VIDEO ASSISTED THORACOSCOPY (VATS)/EMPYEMA AND DRAINAGE OF EMPYEMA (Right) R VATS in am Procedure discussed with patient   LOS: 2 days    Tharon Aquas Trigt III 01/05/2015

## 2015-01-06 ENCOUNTER — Inpatient Hospital Stay (HOSPITAL_COMMUNITY): Payer: Medicare Other

## 2015-01-06 ENCOUNTER — Inpatient Hospital Stay (HOSPITAL_COMMUNITY): Payer: Medicare Other | Admitting: Critical Care Medicine

## 2015-01-06 ENCOUNTER — Encounter (HOSPITAL_COMMUNITY)
Admission: AD | Disposition: A | Discharge status: Home Health | Payer: Self-pay | Source: Ambulatory Visit | Attending: Cardiothoracic Surgery

## 2015-01-06 ENCOUNTER — Encounter (HOSPITAL_COMMUNITY): Payer: Self-pay | Admitting: Critical Care Medicine

## 2015-01-06 DIAGNOSIS — J869 Pyothorax without fistula: Secondary | ICD-10-CM

## 2015-01-06 HISTORY — PX: VIDEO ASSISTED THORACOSCOPY (VATS)/EMPYEMA: SHX6172

## 2015-01-06 LAB — POCT I-STAT 3, ART BLOOD GAS (G3+)
Bicarbonate: 26.2 mEq/L — ABNORMAL HIGH (ref 20.0–24.0)
O2 Saturation: 93 %
Patient temperature: 97.6
TCO2: 28 mmol/L (ref 0–100)
pCO2 arterial: 47.9 mmHg — ABNORMAL HIGH (ref 35.0–45.0)
pH, Arterial: 7.344 — ABNORMAL LOW (ref 7.350–7.450)
pO2, Arterial: 69 mmHg — ABNORMAL LOW (ref 80.0–100.0)

## 2015-01-06 LAB — SURGICAL PCR SCREEN
MRSA, PCR: NEGATIVE
Staphylococcus aureus: POSITIVE — AB

## 2015-01-06 LAB — ABO/RH: ABO/RH(D): A POS

## 2015-01-06 SURGERY — VIDEO ASSISTED THORACOSCOPY (VATS)/EMPYEMA
Anesthesia: General | Site: Chest | Laterality: Right

## 2015-01-06 MED ORDER — LACTATED RINGERS IV SOLN
INTRAVENOUS | Status: DC | PRN
  Administered 2015-01-06: 07:00:00 via INTRAVENOUS

## 2015-01-06 MED ORDER — FENTANYL CITRATE (PF) 100 MCG/2ML IJ SOLN
INTRAMUSCULAR | Status: DC | PRN
  Administered 2015-01-06 (×5): 50 ug via INTRAVENOUS

## 2015-01-06 MED ORDER — ALBUTEROL SULFATE (2.5 MG/3ML) 0.083% IN NEBU
2.5000 mg | INHALATION_SOLUTION | RESPIRATORY_TRACT | Status: DC
  Administered 2015-01-06 – 2015-01-08 (×8): 2.5 mg via RESPIRATORY_TRACT
  Filled 2015-01-06 (×8): qty 3

## 2015-01-06 MED ORDER — HYDROMORPHONE HCL 1 MG/ML IJ SOLN
0.2500 mg | INTRAMUSCULAR | Status: DC | PRN
  Administered 2015-01-06 (×4): 0.5 mg via INTRAVENOUS

## 2015-01-06 MED ORDER — NALOXONE HCL 0.4 MG/ML IJ SOLN: 0.4000 mg | INTRAMUSCULAR | Status: DC | PRN

## 2015-01-06 MED ORDER — BISACODYL 5 MG PO TBEC
10.0000 mg | DELAYED_RELEASE_TABLET | Freq: Every day | ORAL | Status: DC
  Administered 2015-01-07 – 2015-01-09 (×3): 10 mg via ORAL
  Filled 2015-01-06 (×3): qty 2

## 2015-01-06 MED ORDER — ONDANSETRON HCL 4 MG/2ML IJ SOLN
4.0000 mg | Freq: Four times a day (QID) | INTRAMUSCULAR | Status: DC | PRN
  Administered 2015-01-07: 4 mg via INTRAVENOUS

## 2015-01-06 MED ORDER — PHENYLEPHRINE 40 MCG/ML (10ML) SYRINGE FOR IV PUSH (FOR BLOOD PRESSURE SUPPORT)
PREFILLED_SYRINGE | INTRAVENOUS | Status: AC
  Filled 2015-01-06: qty 10

## 2015-01-06 MED ORDER — GLYCOPYRROLATE 0.2 MG/ML IJ SOLN
INTRAMUSCULAR | Status: AC
  Filled 2015-01-06: qty 2

## 2015-01-06 MED ORDER — DEXTROSE 5 % IV SOLN
1.0000 g | Freq: Three times a day (TID) | INTRAVENOUS | Status: DC
  Administered 2015-01-06 – 2015-01-08 (×6): 1 g via INTRAVENOUS
  Filled 2015-01-06 (×10): qty 1

## 2015-01-06 MED ORDER — DEXTROSE-NACL 5-0.9 % IV SOLN
INTRAVENOUS | Status: DC
  Administered 2015-01-06: 100 mL/h via INTRAVENOUS

## 2015-01-06 MED ORDER — HYDROMORPHONE HCL 1 MG/ML IJ SOLN
INTRAMUSCULAR | Status: AC
  Administered 2015-01-06: 0.5 mg via INTRAVENOUS
  Filled 2015-01-06: qty 1

## 2015-01-06 MED ORDER — HYDRALAZINE HCL 20 MG/ML IJ SOLN: 10.0000 mg | Freq: Four times a day (QID) | INTRAMUSCULAR | Status: DC | PRN

## 2015-01-06 MED ORDER — VANCOMYCIN HCL 500 MG IV SOLR
500.0000 mg | Freq: Two times a day (BID) | INTRAVENOUS | Status: DC
  Administered 2015-01-06 – 2015-01-08 (×4): 500 mg via INTRAVENOUS
  Filled 2015-01-06 (×6): qty 500

## 2015-01-06 MED ORDER — 0.9 % SODIUM CHLORIDE (POUR BTL) OPTIME
TOPICAL | Status: DC | PRN
  Administered 2015-01-06: 2000 mL

## 2015-01-06 MED ORDER — EPHEDRINE SULFATE 50 MG/ML IJ SOLN
INTRAMUSCULAR | Status: DC | PRN
  Administered 2015-01-06: 10 mg via INTRAVENOUS
  Administered 2015-01-06: 5 mg via INTRAVENOUS

## 2015-01-06 MED ORDER — KETOROLAC TROMETHAMINE 15 MG/ML IJ SOLN
15.0000 mg | Freq: Four times a day (QID) | INTRAMUSCULAR | Status: AC
  Administered 2015-01-06 – 2015-01-08 (×8): 15 mg via INTRAVENOUS
  Filled 2015-01-06 (×8): qty 1

## 2015-01-06 MED ORDER — ONDANSETRON HCL 4 MG/2ML IJ SOLN: 4.0000 mg | Freq: Four times a day (QID) | INTRAMUSCULAR | Status: DC | PRN

## 2015-01-06 MED ORDER — DEXAMETHASONE SODIUM PHOSPHATE 10 MG/ML IJ SOLN
INTRAMUSCULAR | Status: AC
  Filled 2015-01-06: qty 1

## 2015-01-06 MED ORDER — FENTANYL 10 MCG/ML IV SOLN
INTRAVENOUS | Status: DC
  Administered 2015-01-06 (×2): 150 ug via INTRAVENOUS
  Administered 2015-01-06: 10 ug via INTRAVENOUS
  Administered 2015-01-07: 50 ug via INTRAVENOUS
  Administered 2015-01-07: 240 ug via INTRAVENOUS
  Administered 2015-01-07 (×2): 70 ug via INTRAVENOUS
  Administered 2015-01-07: 04:00:00 via INTRAVENOUS
  Administered 2015-01-07: 70 ug via INTRAVENOUS
  Administered 2015-01-08: 50 ug via INTRAVENOUS
  Administered 2015-01-08: 70 ug via INTRAVENOUS
  Administered 2015-01-08: 0 ug via INTRAVENOUS
  Administered 2015-01-08: 50 ug/h via INTRAVENOUS
  Administered 2015-01-08: 20 ug via INTRAVENOUS
  Administered 2015-01-08: 70 ug via INTRAVENOUS
  Administered 2015-01-09: 40.3 ug via INTRAVENOUS
  Administered 2015-01-09: 31.7 ug via INTRAVENOUS
  Filled 2015-01-06 (×3): qty 50

## 2015-01-06 MED ORDER — DIPHENHYDRAMINE HCL 12.5 MG/5ML PO ELIX
12.5000 mg | ORAL_SOLUTION | Freq: Four times a day (QID) | ORAL | Status: DC | PRN
  Filled 2015-01-06: qty 5

## 2015-01-06 MED ORDER — OXYCODONE HCL 5 MG/5ML PO SOLN: 5.0000 mg | Freq: Once | ORAL | Status: DC | PRN

## 2015-01-06 MED ORDER — PROPOFOL 10 MG/ML IV BOLUS
INTRAVENOUS | Status: DC | PRN
  Administered 2015-01-06: 100 mg via INTRAVENOUS
  Administered 2015-01-06: 50 mg via INTRAVENOUS

## 2015-01-06 MED ORDER — LACTATED RINGERS IV SOLN
INTRAVENOUS | Status: DC | PRN
  Administered 2015-01-06 (×2): via INTRAVENOUS

## 2015-01-06 MED ORDER — MUPIROCIN 2 % EX OINT
TOPICAL_OINTMENT | Freq: Two times a day (BID) | CUTANEOUS | Status: DC
  Administered 2015-01-06 – 2015-01-10 (×8): via NASAL
  Filled 2015-01-06 (×2): qty 22

## 2015-01-06 MED ORDER — GLYCOPYRROLATE 0.2 MG/ML IJ SOLN
INTRAMUSCULAR | Status: DC | PRN
Start: 2015-01-06 — End: 2015-01-06
  Administered 2015-01-06: 0.4 mg via INTRAVENOUS

## 2015-01-06 MED ORDER — DIPHENHYDRAMINE HCL 50 MG/ML IJ SOLN: 12.5000 mg | Freq: Four times a day (QID) | INTRAMUSCULAR | Status: DC | PRN

## 2015-01-06 MED ORDER — POTASSIUM CHLORIDE 10 MEQ/50ML IV SOLN
10.0000 meq | Freq: Every day | INTRAVENOUS | Status: DC | PRN
  Filled 2015-01-06: qty 50

## 2015-01-06 MED ORDER — OXYCODONE HCL 5 MG PO TABS: 5.0000 mg | ORAL_TABLET | Freq: Once | ORAL | Status: DC | PRN

## 2015-01-06 MED ORDER — ROCURONIUM BROMIDE 100 MG/10ML IV SOLN
INTRAVENOUS | Status: DC | PRN
  Administered 2015-01-06: 50 mg via INTRAVENOUS

## 2015-01-06 MED ORDER — FENTANYL CITRATE (PF) 250 MCG/5ML IJ SOLN
INTRAMUSCULAR | Status: AC
  Filled 2015-01-06: qty 5

## 2015-01-06 MED ORDER — ACETAMINOPHEN 160 MG/5ML PO SOLN
1000.0000 mg | Freq: Four times a day (QID) | ORAL | Status: DC
  Filled 2015-01-06: qty 40

## 2015-01-06 MED ORDER — MIDAZOLAM HCL 2 MG/2ML IJ SOLN
INTRAMUSCULAR | Status: AC
  Filled 2015-01-06: qty 2

## 2015-01-06 MED ORDER — BUPIVACAINE 0.5 % ON-Q PUMP SINGLE CATH 400 ML
400.0000 mL | INJECTION | Status: DC
  Filled 2015-01-06: qty 400

## 2015-01-06 MED ORDER — ROCURONIUM BROMIDE 50 MG/5ML IV SOLN
INTRAVENOUS | Status: AC
  Filled 2015-01-06: qty 1

## 2015-01-06 MED ORDER — NEOSTIGMINE METHYLSULFATE 10 MG/10ML IV SOLN
INTRAVENOUS | Status: DC | PRN
  Administered 2015-01-06: 3 mg via INTRAVENOUS

## 2015-01-06 MED ORDER — PROPOFOL 10 MG/ML IV BOLUS
INTRAVENOUS | Status: AC
  Filled 2015-01-06: qty 20

## 2015-01-06 MED ORDER — SODIUM CHLORIDE 0.9 % IJ SOLN: 9.0000 mL | INTRAMUSCULAR | Status: DC | PRN

## 2015-01-06 MED ORDER — HEMOSTATIC AGENTS (NO CHARGE) OPTIME
TOPICAL | Status: DC | PRN
  Administered 2015-01-06: 1 via TOPICAL

## 2015-01-06 MED ORDER — DEXAMETHASONE SODIUM PHOSPHATE 10 MG/ML IJ SOLN
INTRAMUSCULAR | Status: DC | PRN
  Administered 2015-01-06: 8 mg via INTRAVENOUS

## 2015-01-06 MED ORDER — ONDANSETRON HCL 4 MG/2ML IJ SOLN
INTRAMUSCULAR | Status: AC
  Filled 2015-01-06: qty 2

## 2015-01-06 MED ORDER — MIDAZOLAM HCL 5 MG/5ML IJ SOLN
INTRAMUSCULAR | Status: DC | PRN
  Administered 2015-01-06: 2 mg via INTRAVENOUS

## 2015-01-06 MED ORDER — ONDANSETRON HCL 4 MG/2ML IJ SOLN
INTRAMUSCULAR | Status: DC | PRN
  Administered 2015-01-06: 4 mg via INTRAVENOUS

## 2015-01-06 MED ORDER — ONDANSETRON HCL 4 MG/2ML IJ SOLN
4.0000 mg | Freq: Four times a day (QID) | INTRAMUSCULAR | Status: DC | PRN
  Administered 2015-01-09: 4 mg via INTRAVENOUS
  Filled 2015-01-06 (×2): qty 2

## 2015-01-06 MED ORDER — CETYLPYRIDINIUM CHLORIDE 0.05 % MT LIQD
7.0000 mL | Freq: Two times a day (BID) | OROMUCOSAL | Status: DC
  Administered 2015-01-06 – 2015-01-10 (×7): 7 mL via OROMUCOSAL

## 2015-01-06 MED ORDER — BUPIVACAINE 0.5 % ON-Q PUMP SINGLE CATH 400 ML
INJECTION | Status: DC | PRN
  Administered 2015-01-06: 400 mL

## 2015-01-06 MED ORDER — SENNOSIDES-DOCUSATE SODIUM 8.6-50 MG PO TABS
1.0000 | ORAL_TABLET | Freq: Every day | ORAL | Status: DC
  Administered 2015-01-06 – 2015-01-07 (×2): 1 via ORAL
  Filled 2015-01-06 (×6): qty 1

## 2015-01-06 MED ORDER — TRAMADOL HCL 50 MG PO TABS
50.0000 mg | ORAL_TABLET | Freq: Four times a day (QID) | ORAL | Status: DC | PRN
  Administered 2015-01-07: 100 mg via ORAL
  Administered 2015-01-08 – 2015-01-09 (×2): 50 mg via ORAL
  Administered 2015-01-09 – 2015-01-10 (×4): 100 mg via ORAL
  Filled 2015-01-06 (×3): qty 2
  Filled 2015-01-06: qty 1
  Filled 2015-01-06 (×3): qty 2

## 2015-01-06 MED ORDER — ACETAMINOPHEN 500 MG PO TABS
1000.0000 mg | ORAL_TABLET | Freq: Four times a day (QID) | ORAL | Status: DC
  Administered 2015-01-06 – 2015-01-11 (×17): 1000 mg via ORAL
  Filled 2015-01-06 (×19): qty 2

## 2015-01-06 SURGICAL SUPPLY — 69 items
BAG DECANTER FOR FLEXI CONT (MISCELLANEOUS) IMPLANT
BLADE SURG 11 STRL SS (BLADE) ×3 IMPLANT
CANISTER SUCTION 2500CC (MISCELLANEOUS) ×3 IMPLANT
CATH KIT ON Q 5IN SLV (PAIN MANAGEMENT) ×3 IMPLANT
CATH ROBINSON RED A/P 22FR (CATHETERS) IMPLANT
CATH THORACIC 28FR (CATHETERS) ×3 IMPLANT
CATH THORACIC 28FR RT ANG (CATHETERS) ×3 IMPLANT
CATH THORACIC 36FR (CATHETERS) IMPLANT
CATH THORACIC 36FR RT ANG (CATHETERS) ×3 IMPLANT
CONN ST 1/4X3/8  BEN (MISCELLANEOUS) ×2
CONN ST 1/4X3/8 BEN (MISCELLANEOUS) ×1 IMPLANT
CONT SPEC 4OZ CLIKSEAL STRL BL (MISCELLANEOUS) ×9 IMPLANT
COVER SURGICAL LIGHT HANDLE (MISCELLANEOUS) ×3 IMPLANT
DERMABOND ADVANCED (GAUZE/BANDAGES/DRESSINGS)
DERMABOND ADVANCED .7 DNX12 (GAUZE/BANDAGES/DRESSINGS) IMPLANT
DRAIN CHANNEL 28F RND 3/8 FF (WOUND CARE) ×3 IMPLANT
DRAPE LAPAROSCOPIC ABDOMINAL (DRAPES) ×3 IMPLANT
DRAPE WARM FLUID 44X44 (DRAPE) ×3 IMPLANT
ELECT BLADE 4.0 EZ CLEAN MEGAD (MISCELLANEOUS) ×3
ELECT BLADE 6.5 EXT (BLADE) ×3 IMPLANT
ELECT REM PT RETURN 9FT ADLT (ELECTROSURGICAL) ×3
ELECTRODE BLDE 4.0 EZ CLN MEGD (MISCELLANEOUS) ×1 IMPLANT
ELECTRODE REM PT RTRN 9FT ADLT (ELECTROSURGICAL) ×1 IMPLANT
GAUZE SPONGE 4X4 12PLY STRL (GAUZE/BANDAGES/DRESSINGS) ×3 IMPLANT
GLOVE SURG SIGNA 7.5 PF LTX (GLOVE) ×6 IMPLANT
GOWN STRL REUS W/ TWL LRG LVL3 (GOWN DISPOSABLE) ×3 IMPLANT
GOWN STRL REUS W/TWL LRG LVL3 (GOWN DISPOSABLE) ×6
KIT BASIN OR (CUSTOM PROCEDURE TRAY) ×3 IMPLANT
KIT ROOM TURNOVER OR (KITS) ×3 IMPLANT
KIT SUCTION CATH 14FR (SUCTIONS) ×3 IMPLANT
NS IRRIG 1000ML POUR BTL (IV SOLUTION) ×6 IMPLANT
PACK CHEST (CUSTOM PROCEDURE TRAY) ×3 IMPLANT
PAD ARMBOARD 7.5X6 YLW CONV (MISCELLANEOUS) ×6 IMPLANT
SEALANT SURG COSEAL 4ML (VASCULAR PRODUCTS) IMPLANT
SEALANT SURG COSEAL 8ML (VASCULAR PRODUCTS) ×3 IMPLANT
SOLUTION ANTI FOG 6CC (MISCELLANEOUS) ×3 IMPLANT
SPONGE GAUZE 4X4 12PLY STER LF (GAUZE/BANDAGES/DRESSINGS) ×3 IMPLANT
SPONGE INTESTINAL PEANUT (DISPOSABLE) ×3 IMPLANT
SPONGE TONSIL 1.25 RF SGL STRG (GAUZE/BANDAGES/DRESSINGS) ×3 IMPLANT
SUT CHROMIC 3 0 SH 27 (SUTURE) ×6 IMPLANT
SUT ETHILON 3 0 PS 1 (SUTURE) IMPLANT
SUT PROLENE 3 0 SH DA (SUTURE) IMPLANT
SUT PROLENE 4 0 RB 1 (SUTURE)
SUT PROLENE 4-0 RB1 .5 CRCL 36 (SUTURE) IMPLANT
SUT PROLENE 6 0 C 1 30 (SUTURE) IMPLANT
SUT SILK  1 MH (SUTURE) ×4
SUT SILK 1 MH (SUTURE) ×2 IMPLANT
SUT SILK 1 TIES 10X30 (SUTURE) IMPLANT
SUT SILK 2 0SH CR/8 30 (SUTURE) IMPLANT
SUT SILK 3 0 SH CR/8 (SUTURE) ×3 IMPLANT
SUT SILK 3 0SH CR/8 30 (SUTURE) IMPLANT
SUT VIC AB 1 CTX 18 (SUTURE) ×3 IMPLANT
SUT VIC AB 2 TP1 27 (SUTURE) ×3 IMPLANT
SUT VIC AB 2-0 CT2 18 VCP726D (SUTURE) ×3 IMPLANT
SUT VIC AB 2-0 CTX 36 (SUTURE) IMPLANT
SUT VIC AB 3-0 SH 18 (SUTURE) IMPLANT
SUT VIC AB 3-0 X1 27 (SUTURE) ×3 IMPLANT
SUT VICRYL 0 UR6 27IN ABS (SUTURE) IMPLANT
SUT VICRYL 2 TP 1 (SUTURE) IMPLANT
SWAB COLLECTION DEVICE MRSA (MISCELLANEOUS) ×3 IMPLANT
SYSTEM SAHARA CHEST DRAIN ATS (WOUND CARE) ×3 IMPLANT
TAPE CLOTH SURG 4X10 WHT LF (GAUZE/BANDAGES/DRESSINGS) ×3 IMPLANT
TIP APPLICATOR SPRAY EXTEND 16 (VASCULAR PRODUCTS) IMPLANT
TOWEL OR 17X24 6PK STRL BLUE (TOWEL DISPOSABLE) ×3 IMPLANT
TOWEL OR 17X26 10 PK STRL BLUE (TOWEL DISPOSABLE) ×3 IMPLANT
TRAP SPECIMEN MUCOUS 40CC (MISCELLANEOUS) ×3 IMPLANT
TRAY FOLEY CATH 16FRSI W/METER (SET/KITS/TRAYS/PACK) ×3 IMPLANT
TUBE ANAEROBIC SPECIMEN COL (MISCELLANEOUS) ×3 IMPLANT
WATER STERILE IRR 1000ML POUR (IV SOLUTION) ×6 IMPLANT

## 2015-01-06 NOTE — Progress Notes (Signed)
The patient was examined and preop studies reviewed. There has been no change from the prior exam and the patient is ready for surgery.  plan Right VATS on [address] [place]

## 2015-01-06 NOTE — Progress Notes (Signed)
CT surgery p.m. Rounds  Resting comfortably after right VATS today for empyema Postoperative ABG adequate No air leak or significant drainage from chest tubes Pain control adequate with PCA and Toradol

## 2015-01-06 NOTE — Transfer of Care (Signed)
Immediate Anesthesia Transfer of Care Note  Patient: Tina Bell  Procedure(s) Performed: Procedure(s): VIDEO ASSISTED THORACOSCOPY (VATS)/EMPYEMA AND DRAINAGE OF EMPYEMA (Right)  Patient Location: PACU  Anesthesia Type:General  Level of Consciousness: awake, alert  and oriented  Airway & Oxygen Therapy: Patient Spontanous Breathing and Patient connected to nasal cannula oxygen  Post-op Assessment: Report given to RN, Post -op Vital signs reviewed and stable and Patient moving all extremities X 4  Post vital signs: Reviewed and stable  Last Vitals:  Filed Vitals:   01/06/15 0548  BP: 142/80  Pulse: 76  Temp: 36.7 C  Resp: 15  HR 67, bP 142/76, RR 18, Sats 76% on 2L [place]  Complications: No apparent anesthesia complications

## 2015-01-06 NOTE — Brief Op Note (Signed)
01/03/2015 - 01/06/2015      [address]       [place] [address]             [phone]     01/03/2015 - 01/06/2015  10:31 AM  PATIENT:  Tina Bell  79 y.o. female  PRE-OPERATIVE DIAGNOSIS:  RIGHT EMPYEMA  POST-OPERATIVE DIAGNOSIS:  RIGHT EMPYEMA  PROCEDURE:  Procedure(s): VIDEO ASSISTED THORACOSCOPY (VATS), MINI-THORACOTOMY, DRAINAGE AND DECORTICATION OF EMPYEMA  SURGEON:  Surgeon(s): Ivin Poot, MD  PHYSICIAN ASSISTANT: Taylore Hinde PA-C  ANESTHESIA:   general  SPECIMEN:  Source of Specimen:  RIGHT EMPYEMA  DISPOSITION OF SPECIMEN:  Pathology  DRAINS: 2 Chest Tube(s) in the RIGHT HEMITHORAX   PATIENT CONDITION:  PACU - hemodynamically stable.  PRE-OPERATIVE WEIGHT: 20NO  COMPLICATIONS: NO KNOWN  EBL: MINIMAL

## 2015-01-06 NOTE — Progress Notes (Signed)
ANTIBIOTIC CONSULT NOTE - INITIAL  Pharmacy Consult for Vancomycin/Aztreonam Indication: Empyema  Allergies  Allergen Reactions  . Keflex [Cephalexin] Hives  . Morphine And Related Hives    Patient Measurements: Height: 5\' 3"  (160 cm) Weight: 128 lb 9.6 oz (58.333 kg) IBW/kg (Calculated) : 52.4 Adjusted Body Weight:    Vital Signs: Temp: 97.3 F (36.3 C) (05/30 1110) Temp Source: Oral (05/30 0548) BP: 159/83 mmHg (05/30 1105) Pulse Rate: 79 (05/30 1115) Intake/Output from previous day: 05/29 0701 - 05/30 0700 In: 720 [P.O.:720] Out: 425 [Urine:425] Intake/Output from this shift: Total I/O In: 1400 [I.V.:1400] Out: 225 [Urine:175; Blood:50]  Labs:  Recent Labs  01/03/15 1956 01/04/15 0346 01/05/15 0417 01/05/15 2106  WBC 8.5 8.1 7.7 7.8  HGB 11.4* 10.7* 10.9* 10.8*  PLT 443* 392 439* 449*  CREATININE 0.69 0.78  --  0.93   Estimated Creatinine Clearance: 42.6 mL/min (by C-G formula based on Cr of 0.93). No results for input(s): VANCOTROUGH, VANCOPEAK, VANCORANDOM, GENTTROUGH, GENTPEAK, GENTRANDOM, TOBRATROUGH, TOBRAPEAK, TOBRARND, AMIKACINPEAK, AMIKACINTROU, AMIKACIN in the last 72 hours.   Microbiology:   Medical History: Past Medical History  Diagnosis Date  . Pneumonia 1997  . History of kidney stones 0973,5329  . Arthritis     knees  . Complication of anesthesia     difficulty waking up from anesthesia  . Hx of seasonal allergies   . Pleural effusion 12/2014    Medications:  Prescriptions prior to admission  Medication Sig Dispense Refill Last Dose  . amoxicillin-clavulanate (AUGMENTIN) 875-125 MG per tablet Take 1 tablet by mouth 2 (two) times daily. 7 day regimen   01/03/2015 at Unknown time  . calcium-vitamin D (OSCAL WITH D) 500-200 MG-UNIT per tablet Take 1 tablet by mouth 2 (two) times daily.    12/17/2014  . celecoxib (CELEBREX) 200 MG capsule Take 200 mg by mouth daily.    12/17/2014  . cholecalciferol (VITAMIN D) 1000 UNITS tablet Take  1,000 Units by mouth daily.   12/17/2014  . guaiFENesin (MUCINEX) 600 MG 12 hr tablet Take 1,200 mg by mouth 2 (two) times daily as needed for cough or to loosen phlegm.    01/03/2015 at Unknown time  . guaiFENesin-codeine (ROBITUSSIN AC) 100-10 MG/5ML syrup Take 5 mLs by mouth 3 (three) times daily as needed for cough.   01/03/2015 at Unknown time  . Krill Oil 1000 MG CAPS Take 1 capsule by mouth 2 (two) times daily.    12/17/2014  . Multiple Vitamins-Minerals (WOMENS MULTI VITAMIN & MINERAL PO) Take by mouth daily.   12/17/2014  . Respiratory Therapy Supplies (FLUTTER) DEVI Use 2-4 times daily 1 each 0 unknown  . vitamin C (ASCORBIC ACID) 500 MG tablet Take 500 mg by mouth daily.   12/17/2014   Assessment: Hx CAP and recently developed pleural effusion. Augmentin PTA. R empyema with VATS 5/31.  ID: Start Vanco/Aztreonam for empyema. Afebrile. WBC 7.8. CrCl 42. F/u cultures. Levaquin 5/27>5/30 Vanco 5/30>> Aztreonam 5/30>>  Goal of Therapy:  Vancomycin trough level 15-20 mcg/ml  Plan:  Vancomycin 1g preop 5/30, then start Vanco 500mg  IV q12h. Aztreonam 1g IV q8hr.  Raquelle Pietro S. Alford Highland, PharmD, BCPS Clinical Staff Pharmacist Pager [place], [place] 01/06/2015,11:52 AM

## 2015-01-06 NOTE — Anesthesia Preprocedure Evaluation (Addendum)
Anesthesia Evaluation  Patient identified by MRN, date of birth, ID band Patient awake    Reviewed: Allergy & Precautions, NPO status , Patient's Chart, lab work & pertinent test results  Airway Mallampati: II   Neck ROM: full    Dental  (+) Dental Advisory Given   Pulmonary pneumonia -, former smoker,  Right empyema breath sounds clear to auscultation        Cardiovascular negative cardio ROS  Rhythm:regular Rate:Normal     Neuro/Psych    GI/Hepatic   Endo/Other    Renal/GU      Musculoskeletal  (+) Arthritis -,   Abdominal   Peds  Hematology   Anesthesia Other Findings   Reproductive/Obstetrics                           Anesthesia Physical Anesthesia Plan  ASA: II  Anesthesia Plan: General   Post-op Pain Management:    Induction: Intravenous  Airway Management Planned: Double Lumen EBT  Additional Equipment: Arterial line  Intra-op Plan:   Post-operative Plan: Extubation in OR  Informed Consent: I have reviewed the patients History and Physical, chart, labs and discussed the procedure including the risks, benefits and alternatives for the proposed anesthesia with the patient or authorized representative who has indicated his/her understanding and acceptance.   Dental advisory given  Plan Discussed with: Anesthesiologist and Surgeon  Anesthesia Plan Comments:        Anesthesia Quick Evaluation

## 2015-01-06 NOTE — Progress Notes (Signed)
PULMONARY / CRITICAL CARE MEDICINE   Name: Tina Bell MRN: [id number] DOB: 05/04/1936    ADMISSION DATE:  01/03/2015 CONSULTATION DATE:  01/03/2015  REFERRING MD :  Lake Bells  CHIEF COMPLAINT:  Chest pain shortness of breath  INITIAL PRESENTATION: 79 year old female with a history of bronchiectasis developed community-acquired pneumonia in May 2016 and presented to the pulmonary office on May 26 with a new right-sided pleural effusion. Thoracentesis was attempted but no fluid could be removed. She was admitted for further management  STUDIES:  01/03/2015 CT chest images personally reviewed, radiology report pending> there is consolidation and it least a moderate sized pleural effusion on the right side some bronchiectasis noted  SIGNIFICANT EVENTS:  Subjective: Seen in icu post op, doing well on [place].  VITAL SIGNS: Temp:  [96.6 F (35.9 C)-98.1 F (36.7 C)] 96.8 F (36 C) (05/30 1200) Pulse Rate:  [57-92] 77 (05/30 1400) Resp:  [10-21] 16 (05/30 1400) BP: (109-159)/(55-83) 109/55 mmHg (05/30 1400) SpO2:  [93 %-100 %] 100 % (05/30 1429) Arterial Line BP: (131-161)/(58-75) 136/64 mmHg (05/30 1400) Weight:  [58.333 kg (128 lb 9.6 oz)] 58.333 kg (128 lb 9.6 oz) (05/30 0548)    INTAKE / OUTPUT:  Intake/Output Summary (Last 24 hours) at 01/06/15 1538 Last data filed at 01/06/15 1300  Gross per 24 hour  Intake   1640 ml  Output   1140 ml  Net    500 ml    PHYSICAL EXAMINATION: General:  In no distress  Neuro:  Alert and oriented 4, moves all 4 extremities HEENT:  NCAT, oropharynx clear, neck supple Cardiovascular:  Regular rate and rhythm no murmurs gallops or rubs Lungs:  Decreased BS RLL area , CTs in place on R  Abdomen:  Bowel sounds positive, nontender nondistended Musculoskeletal:  Normal bulk and tone Skin:  No rash or skin breakdown  LABS:  CBC  Recent Labs Lab 01/04/15 0346 01/05/15 0417 01/05/15 2106  WBC 8.1 7.7 7.8  HGB 10.7* 10.9* 10.8*  HCT  33.5* 33.8* 34.0*  PLT 392 439* 449*   Coag's  Recent Labs Lab 01/05/15 2106  APTT 43*  INR 1.16   BMET  Recent Labs Lab 01/03/15 1956 01/04/15 0346 01/05/15 2106  NA 140 139 140  K 3.8 4.1 3.8  CL 103 105 105  CO2 27 25 28   BUN 17 17 16   CREATININE 0.69 0.78 0.93  GLUCOSE 102* 97 148*   Electrolytes  Recent Labs Lab 01/03/15 1956 01/04/15 0346 01/05/15 2106  CALCIUM 9.3 9.1 9.0   Sepsis Markers No results for input(s): LATICACIDVEN, PROCALCITON, O2SATVEN in the last 168 hours. ABG  Recent Labs Lab 01/05/15 2115 01/06/15 1344  PHART 7.459* 7.344*  PCO2ART 37.5 47.9*  PO2ART 69.6* 69.0*   Liver Enzymes  Recent Labs Lab 01/05/15 2106  AST 35  ALT 30  ALKPHOS 144*  BILITOT 0.3  ALBUMIN 2.5*   Cardiac Enzymes No results for input(s): TROPONINI, PROBNP in the last 168 hours. Glucose No results for input(s): GLUCAP in the last 168 hours.  Imaging Dg Chest 2 View  01/06/2015   CLINICAL DATA:  Effusion and shortness of Breath  EXAM: CHEST  2 VIEW  COMPARISON:  01/05/2015  FINDINGS: The heart size appears normal. No pleural effusion or edema. No airspace consolidation identified. There is a loculated right pleural effusion containing gas identified which appears unchanged in volume from previous exam. Overlying compressive type atelectasis noted.  IMPRESSION: 1. No change in loculated, gas containing right pleural  effusion with overlying consolidation.   Electronically Signed   By: Kerby Moors M.D.   On: 01/06/2015 08:06   Dg Chest Port 1 View  01/06/2015   CLINICAL DATA:  Central line placement.  EXAM: PORTABLE CHEST - 1 VIEW  COMPARISON:  Single view of the chest earlier this same day.  FINDINGS: Right IJ catheter is identified with the tip projecting over the lower superior vena cava. 2 right chest tubes are in place. Endotracheal tube has been removed. No pneumothorax identified. Small right effusion and basilar airspace disease are seen.  IMPRESSION:  Right IJ catheter tip projects over the lower superior vena cava. No pneumothorax with 2 right chest tubes in place.  Endotracheal tube has been removed.  Small right effusion and basilar airspace disease.   Electronically Signed   By: Inge Rise M.D.   On: 01/06/2015 12:33   Dg Chest Portable 1 View  01/06/2015   CLINICAL DATA:  Postop  EXAM: PORTABLE CHEST - 1 VIEW  COMPARISON:  01/06/2015  FINDINGS: Borderline cardiomegaly is noted. There is double lumen endotracheal tube with lower tip in left main bronchus. Mild atelectasis noted in left upper lobe. Two right-sided chest tubes are noted. No pneumothorax.  IMPRESSION: There is double lumen endotracheal tube with lower tip in left main bronchus. Mild atelectasis noted in left upper lobe. Two right-sided chest tubes are noted. No pneumothorax.   Electronically Signed   By: Lahoma Crocker M.D.   On: 01/06/2015 11:02     ASSESSMENT / PLAN:  PULMONARY A: Community-acquired pneumonia which has failed over 2 courses of oral antibiotics,  scan now associated with a right-sided parapneumonic effusion. S/p VATS 01/06/2015 with findings of empyema and bronchopleural fistula. Apprec dr Lucianne Lei trigt  P:   Cont IV ABX pulm toilet, bronchial hygiene, pain control postop  INFECTIOUS A:  Community-acquired pneumonia as detailed above P:   BCx2 01/03/2015> Pleural fluid cult 01/03/15>> 01/06/2015: tissue and pleural fluid from VATS>>>  Abx: Levaquin IV 750 mg daily, start date May 27, day 4 of 7    FAMILY  - Updates: no family at present   Glyn Ade  [phone]  Cell  [phone]  If no response or cell goes to voicemail, call beeper [phone]  01/06/2015, 3:38 PM

## 2015-01-06 NOTE — Anesthesia Procedure Notes (Signed)
Procedure Name: Intubation Date/Time: 01/06/2015 8:17 AM Performed by: Merrilyn Puma B Pre-anesthesia Checklist: Patient identified, Timeout performed, Emergency Drugs available, Suction available and Patient being monitored Patient Re-evaluated:Patient Re-evaluated prior to inductionOxygen Delivery Method: Circle system utilized Preoxygenation: Pre-oxygenation with 100% oxygen Intubation Type: IV induction Ventilation: Mask ventilation without difficulty and Oral airway inserted - appropriate to patient size Laryngoscope Size: Mac and 3 Grade View: Grade II Endobronchial tube: Double lumen EBT and Left Number of attempts: 1 Airway Equipment and Method: Stylet Placement Confirmation: positive ETCO2,  CO2 detector,  ETT inserted through vocal cords under direct vision and breath sounds checked- equal and bilateral Secured at: 27 cm Tube secured with: Tape Dental Injury: Teeth and Oropharynx as per pre-operative assessment

## 2015-01-06 NOTE — Anesthesia Postprocedure Evaluation (Signed)
Anesthesia Post Note  Patient: Tina Bell  Procedure(s) Performed: Procedure(s) (LRB): VIDEO ASSISTED THORACOSCOPY (VATS)/EMPYEMA AND DRAINAGE OF EMPYEMA (Right)  Anesthesia type: General  Patient location: PACU  Post pain: Pain level controlled and Adequate analgesia  Post assessment: Post-op Vital signs reviewed, Patient's Cardiovascular Status Stable, Respiratory Function Stable, Patent Airway and Pain level controlled  Last Vitals:  Filed Vitals:   01/06/15 1115  BP:   Pulse: 79  Temp:   Resp: 19    Post vital signs: Reviewed and stable  Level of consciousness: awake, alert  and oriented  Complications: No apparent anesthesia complications

## 2015-01-07 ENCOUNTER — Inpatient Hospital Stay (HOSPITAL_COMMUNITY): Payer: Medicare Other

## 2015-01-07 ENCOUNTER — Encounter (HOSPITAL_COMMUNITY): Payer: Self-pay | Admitting: Cardiothoracic Surgery

## 2015-01-07 LAB — CBC
HCT: 28.6 % — ABNORMAL LOW (ref 36.0–46.0)
Hemoglobin: 9 g/dL — ABNORMAL LOW (ref 12.0–15.0)
MCH: 26.8 pg (ref 26.0–34.0)
MCHC: 31.5 g/dL (ref 30.0–36.0)
MCV: 85.1 fL (ref 78.0–100.0)
Platelets: 360 10*3/uL (ref 150–400)
RBC: 3.36 MIL/uL — ABNORMAL LOW (ref 3.87–5.11)
RDW: 13.4 % (ref 11.5–15.5)
WBC: 11 10*3/uL — ABNORMAL HIGH (ref 4.0–10.5)

## 2015-01-07 LAB — BODY FLUID CULTURE
Culture: NO GROWTH
Culture: NO GROWTH
Special Requests: NORMAL

## 2015-01-07 LAB — BASIC METABOLIC PANEL
Anion gap: 6 (ref 5–15)
BUN: 8 mg/dL (ref 6–20)
CO2: 26 mmol/L (ref 22–32)
Calcium: 8.1 mg/dL — ABNORMAL LOW (ref 8.9–10.3)
Chloride: 108 mmol/L (ref 101–111)
Creatinine, Ser: 0.55 mg/dL (ref 0.44–1.00)
GFR calc Af Amer: >60 mL/min (ref >60)
GFR calc non Af Amer: >60 mL/min (ref >60)
Glucose, Bld: 144 mg/dL — ABNORMAL HIGH (ref 65–99)
Potassium: 3.8 mmol/L (ref 3.5–5.1)
Sodium: 140 mmol/L (ref 135–145)

## 2015-01-07 LAB — POCT I-STAT 3, ART BLOOD GAS (G3+)
Acid-base deficit: 1 mmol/L (ref 0.0–2.0)
Bicarbonate: 25 mEq/L — ABNORMAL HIGH (ref 20.0–24.0)
O2 Saturation: 99 %
Patient temperature: 98.4
TCO2: 26 mmol/L (ref 0–100)
pCO2 arterial: 45.1 mmHg — ABNORMAL HIGH (ref 35.0–45.0)
pH, Arterial: 7.352 (ref 7.350–7.450)
pO2, Arterial: 122 mmHg — ABNORMAL HIGH (ref 80.0–100.0)

## 2015-01-07 LAB — AFB STAIN: Acid Fast Smear: NONE SEEN

## 2015-01-07 MED ORDER — ENOXAPARIN SODIUM 30 MG/0.3ML ~~LOC~~ SOLN
30.0000 mg | SUBCUTANEOUS | Status: DC
  Administered 2015-01-07 – 2015-01-10 (×4): 30 mg via SUBCUTANEOUS
  Filled 2015-01-07 (×5): qty 0.3

## 2015-01-07 NOTE — Progress Notes (Signed)
1 Day Post-Op Procedure(s) (LRB): VIDEO ASSISTED THORACOSCOPY (VATS)/EMPYEMA AND DRAINAGE OF EMPYEMA (Right) Subjective: S/p VATS for empyema Pain controlled No air leak Lungs clear  Objective: Vital signs in last 24 hours: Temp:  [96.6 F (35.9 C)-98.4 F (36.9 C)] 97.7 F (36.5 C) (05/31 0801) Pulse Rate:  [49-100] 94 (05/31 0630) Cardiac Rhythm:  [-] Normal sinus rhythm;Sinus bradycardia (05/31 0400) Resp:  [10-24] 22 (05/31 0630) BP: (84-159)/(50-83) 89/54 mmHg (05/31 0500) SpO2:  [93 %-100 %] 98 % (05/31 0759) Arterial Line BP: (94-165)/(46-91) 165/91 mmHg (05/31 0630) FiO2 (%):  [39 %] 39 % (05/31 0415)  Hemodynamic parameters for last 24 hours:  stable  Intake/Output from previous day: 05/30 0701 - 05/31 0700 In: 3610 [P.O.:360; I.V.:3200; IV Piggyback:50] Out: 2425 [Urine:2105; Blood:50; Chest Tube:270] Intake/Output this shift:      Lab Results:  Recent Labs  01/05/15 2106 01/07/15 0445  WBC 7.8 11.0*  HGB 10.8* 9.0*  HCT 34.0* 28.6*  PLT 449* 360   BMET:  Recent Labs  01/05/15 2106 01/07/15 0445  NA 140 140  K 3.8 3.8  CL 105 108  CO2 28 26  GLUCOSE 148* 144*  BUN 16 8  CREATININE 0.93 0.55  CALCIUM 9.0 8.1*    PT/INR:  Recent Labs  01/05/15 2106  LABPROT 15.0  INR 1.16   ABG    Component Value Date/Time   PHART 7.352 01/07/2015 0457   HCO3 25.0* 01/07/2015 0457   TCO2 26 01/07/2015 0457   ACIDBASEDEF 1.0 01/07/2015 0457   O2SAT 99.0 01/07/2015 0457   CBG (last 3)  No results for input(s): GLUCAP in the last 72 hours.  Assessment/Plan: S/P Procedure(s) (LRB): VIDEO ASSISTED THORACOSCOPY (VATS)/EMPYEMA AND DRAINAGE OF EMPYEMA (Right) Chest tubes to water seal DC Foley Tx 3 south  LOS: 4 days    Tharon Aquas Trigt III 01/07/2015

## 2015-01-07 NOTE — Progress Notes (Signed)
TCTS BRIEF SICU PROGRESS NOTE  1 Day Post-Op  S/P Procedure(s) (LRB): VIDEO ASSISTED THORACOSCOPY (VATS)/EMPYEMA AND DRAINAGE OF EMPYEMA (Right)   Stable day NSR w/ stable BP O2 sats 95-99% on 2 L/min  Plan: Continue current plan  Rexene Alberts 01/07/2015 6:42 PM

## 2015-01-07 NOTE — Op Note (Signed)
NAMESKARLETT, Tina Bell NO.:  [id number]  MEDICAL RECORD NO.:  [id number]  LOCATION:                                FACILITY:  [facility]  PHYSICIAN:  Ivin Poot, M.D.  DATE OF BIRTH:  24-Nov-1935  DATE OF PROCEDURE: DATE OF DISCHARGE:                              OPERATIVE REPORT   OPERATION: 1. Right VATS (video-assisted thoracoscopic surgery), mini     thoracotomy, drainage of loculated empyema, and decortication of     peel over right lower lobe. 2. Placement of wound On-Q pain analgesia system.  PREOPERATIVE DIAGNOSIS:  Right lower lobe pneumonia with right empyema.  POSTOPERATIVE DIAGNOSIS:  Right lower lobe pneumonia with right empyema.  SURGEON:  Ivin Poot, M.D.  ASSISTANT:  Wayne gold, PA-C.  ANESTHESIA:  General by Dr. Albertha Ghee.  INDICATIONS:  The patient is a 79 year old Caucasian female, nonsmoker, with history of bronchiectasis, who has had a long protracted course of respiratory infection, cough, malaise, weight loss, and pleuritic right chest pain.  She was evaluated and placed on antibiotics for a few weeks with minimal improvement.  A followup CT scan showed a loculated pleural effusion and entrapment of the right lower lobe.  Attempted thoracentesis x2 did not return any significant fluid.  The patient was admitted to the hospital and placed on IV antibiotics.  Pulmonary Medicine requested thoracic surgical evaluation and recommended a VATS for drainage and decortication.  I examined the patient, reviewed her x- ray studies and agreed with the recommendation of Pulmonary Medicine for the above procedure.  I discussed the procedure in detail with the patient and her family including the use of general anesthesia, location of the surgical incision, use of postoperative chest tube drainage system, and expected recovery.  She understood the risks of bleeding, recurrent infection, prolonged air leak, postoperative pneumonia,  and death.  She provided why I felt it was an informed consent for this operation.  OPERATIVE PROCEDURE:  The patient was brought to the operating room, placed supine on the operating table where general anesthesia was induced.  A double-lumen endotracheal tube was positioned by the Anesthesia team.  The patient was turned right side up, and right chest was prepped and draped as a sterile field.  A proper time-out was performed.  A small incision was made at the tip of the right scapula in the 6th interspace.  There was dense adhesions between the lung and the chest wall at this point.  The incision was extended anteriorly, and the lung was carefully dissected off the chest wall.  We encountered a loculated pleural effusion with multiple pockets of thick fluid which were opened and drained.  These were sent for culture.  The patient had developed a thick peel over the right lower lobe which was carefully dissected off the visceral pleura.  After performing the decortication in the area where the pneumonia in the right lower lobe had penetrated, the visceral pleura was identified, and this was sharply debrided, and irrigated and closed with 2 chromic sutures.  We then developed carefully a plan of dissection to free up the entire right lower lobe and opening all pockets of fluid,  and further pleural material was removed.  The area was irrigated with copious amounts of saline.  Two drainage tubes were placed posteriorly and anteriorly in the pleural space and brought out through separate incisions.  The lung was inflated under water, and there was no significant air leak noted. The water was suctioned out.  The ribs were reapproximated with interrupted #1 Vicryl.  The muscle layers were closed with interrupted Vicryl, and the skin was closed with a subcuticular suture after the subcutaneous layer was closed.  An On-Q catheter was placed beneath the main incision above the chest tube  site, connected to a 0.5% Marcaine reservoir and secured to the skin with silk suture.  Sterile dressings were applied.  The patient was then rolled supine.  The chest x-ray showed good re-expansion of the right lower lobe without residual pleural effusion.  The patient was extubated, returned to the recovery room in stable condition.     Ivin Poot, M.D.     PV/MEDQ  D:  01/06/2015  T:  01/06/2015  Job:  [id number]  cc:   Lynnell Chad. Shelia Media, M.D.

## 2015-01-08 ENCOUNTER — Inpatient Hospital Stay (HOSPITAL_COMMUNITY): Payer: Medicare Other

## 2015-01-08 LAB — COMPREHENSIVE METABOLIC PANEL
ALT: 28 U/L (ref 14–54)
AST: 42 U/L — ABNORMAL HIGH (ref 15–41)
Albumin: 2 g/dL — ABNORMAL LOW (ref 3.5–5.0)
Alkaline Phosphatase: 113 U/L (ref 38–126)
Anion gap: 8 (ref 5–15)
BUN: 7 mg/dL (ref 6–20)
CO2: 26 mmol/L (ref 22–32)
Calcium: 8.1 mg/dL — ABNORMAL LOW (ref 8.9–10.3)
Chloride: 106 mmol/L (ref 101–111)
Creatinine, Ser: 0.66 mg/dL (ref 0.44–1.00)
GFR calc Af Amer: >60 mL/min (ref >60)
GFR calc non Af Amer: >60 mL/min (ref >60)
Glucose, Bld: 97 mg/dL (ref 65–99)
Potassium: 3.9 mmol/L (ref 3.5–5.1)
Sodium: 140 mmol/L (ref 135–145)
Total Bilirubin: 0.4 mg/dL (ref 0.3–1.2)
Total Protein: 5.6 g/dL — ABNORMAL LOW (ref 6.5–8.1)

## 2015-01-08 LAB — CBC
HCT: 28.6 % — ABNORMAL LOW (ref 36.0–46.0)
Hemoglobin: 9.1 g/dL — ABNORMAL LOW (ref 12.0–15.0)
MCH: 27.7 pg (ref 26.0–34.0)
MCHC: 31.8 g/dL (ref 30.0–36.0)
MCV: 86.9 fL (ref 78.0–100.0)
Platelets: 362 10*3/uL (ref 150–400)
RBC: 3.29 MIL/uL — ABNORMAL LOW (ref 3.87–5.11)
RDW: 13.8 % (ref 11.5–15.5)
WBC: 9.2 10*3/uL (ref 4.0–10.5)

## 2015-01-08 MED ORDER — SORBITOL 70 % SOLN
30.0000 mL | Freq: Every day | Status: AC
  Administered 2015-01-08 – 2015-01-09 (×2): 30 mL via ORAL
  Filled 2015-01-08 (×2): qty 30

## 2015-01-08 MED ORDER — LEVOFLOXACIN 500 MG PO TABS
500.0000 mg | ORAL_TABLET | Freq: Every day | ORAL | Status: DC
  Administered 2015-01-08 – 2015-01-11 (×4): 500 mg via ORAL
  Filled 2015-01-08 (×4): qty 1

## 2015-01-08 MED ORDER — ALBUTEROL SULFATE (2.5 MG/3ML) 0.083% IN NEBU: 2.5000 mg | INHALATION_SOLUTION | RESPIRATORY_TRACT | Status: DC | PRN

## 2015-01-08 NOTE — Progress Notes (Addendum)
Confused R sided CP with movement No distress  Filed Vitals:   01/08/15 1301 01/08/15 1400 01/08/15 1500 01/08/15 1600  BP:  111/65 116/75   Pulse:  83 81   Temp: 98.8 F (37.1 C)     TempSrc: Oral     Resp:  14 20 20   Height:      Weight:      SpO2:  97% 96% 96%   NAD No wheezes Reg, no M NABS No edema  BMET    Component Value Date/Time   NA 140 01/08/2015 0430   K 3.9 01/08/2015 0430   CL 106 01/08/2015 0430   CO2 26 01/08/2015 0430   GLUCOSE 97 01/08/2015 0430   BUN 7 01/08/2015 0430   CREATININE 0.66 01/08/2015 0430   CALCIUM 8.1* 01/08/2015 0430   GFRNONAA >60 01/08/2015 0430   GFRAA >60 01/08/2015 0430    CBC    Component Value Date/Time   WBC 9.2 01/08/2015 0430   RBC 3.29* 01/08/2015 0430   HGB 9.1* 01/08/2015 0430   HCT 28.6* 01/08/2015 0430   PLT 362 01/08/2015 0430   MCV 86.9 01/08/2015 0430   MCH 27.7 01/08/2015 0430   MCHC 31.8 01/08/2015 0430   RDW 13.8 01/08/2015 0430   LYMPHSABS 1.6 01/03/2015 1956   MONOABS 0.7 01/03/2015 1956   EOSABS 0.2 01/03/2015 1956   BASOSABS 0.0 01/03/2015 1956    CXR: [facility]  IMPRESSION: Empyema, s/p VATS drainage  PLAN: Change abx to PO levofloxacin Complete 10 days abx from time of surgery Post op mgmt per TCTS  Merton Border, MD ; [facility] service Mobile [phone].  After 5:30 PM or weekends, call [phone]

## 2015-01-08 NOTE — Progress Notes (Addendum)
TCTS DAILY ICU PROGRESS NOTE                   [address]            [place] [address]          [phone]   2 Days Post-Op Procedure(s) (LRB): VIDEO ASSISTED THORACOSCOPY (VATS)/EMPYEMA AND DRAINAGE OF EMPYEMA (Right)  Total Length of Stay:  LOS: 5 days   Subjective: OOB in chair.  Feels well except sore. Breathing stable.   Objective: Vital signs in last 24 hours: Temp:  [97.9 F (36.6 C)-99.1 F (37.3 C)] 99.1 F (37.3 C) (06/01 0827) Pulse Rate:  [71-97] 80 (06/01 0700) Cardiac Rhythm:  [-] Normal sinus rhythm (06/01 0000) Resp:  [12-23] 14 (06/01 0700) BP: (86-129)/(40-87) 115/62 mmHg (06/01 0700) SpO2:  [84 %-100 %] 94 % (06/01 0700) Arterial Line BP: (78-108)/(36-57) 104/44 mmHg (05/31 1600) Weight:  [133 lb 8 oz (60.555 kg)] 133 lb 8 oz (60.555 kg) (06/01 0600)  Filed Weights   01/05/15 0552 01/06/15 0548 01/08/15 0600  Weight: 128 lb 4.8 oz (58.196 kg) 128 lb 9.6 oz (58.333 kg) 133 lb 8 oz (60.555 kg)    Weight change:    Hemodynamic parameters for last 24 hours:    Intake/Output from previous day: 05/31 0701 - 06/01 0700 In: 2951 [P.O.:1320; I.V.:2100; IV Piggyback:150] Out: 1905 [Urine:1805; Chest Tube:100]  Intake/Output this shift:    Current Meds: Scheduled Meds: . acetaminophen  1,000 mg Oral 4 times per day   Or  . acetaminophen (TYLENOL) oral liquid 160 mg/5 mL  1,000 mg Oral 4 times per day  . albuterol  2.5 mg Nebulization Q4H while awake  . antiseptic oral rinse  7 mL Mouth Rinse BID  . aztreonam  1 g Intravenous 3 times per day  . bisacodyl  10 mg Oral Daily  . calcium-vitamin D  1 tablet Oral BID  . cholecalciferol  1,000 Units Oral Daily  . enoxaparin (LOVENOX) injection  30 mg Subcutaneous Q24H  . fentaNYL   Intravenous 6 times per day  . guaiFENesin  1,200 mg Oral BID  . ketorolac  15 mg Intravenous 4 times per day  . mupirocin ointment   Nasal BID  . senna-docusate  1 tablet Oral QHS  . vancomycin  500 mg  Intravenous Q12H   Continuous Infusions: . bupivacaine 0.5 % ON-Q pump SINGLE CATH 400 mL    . dextrose 5 % and 0.9% NaCl 100 mL/hr at 01/08/15 0700   PRN Meds:.diphenhydrAMINE **OR** diphenhydrAMINE, guaiFENesin-codeine, hydrALAZINE, naloxone **AND** sodium chloride, ondansetron (ZOFRAN) IV, ondansetron (ZOFRAN) IV, ondansetron (ZOFRAN) IV, potassium chloride, traMADol  Physical Exam: General appearance: alert, cooperative and no distress Heart: regular rate and rhythm Lungs: diminished breath sounds bibasilar Wound: Dressed and dry Chest tube: No air leak  Lab Results: CBC: Recent Labs  01/07/15 0445 01/08/15 0430  WBC 11.0* 9.2  HGB 9.0* 9.1*  HCT 28.6* 28.6*  PLT 360 362   BMET:  Recent Labs  01/07/15 0445 01/08/15 0430  NA 140 140  K 3.8 3.9  CL 108 106  CO2 26 26  GLUCOSE 144* 97  BUN 8 7  CREATININE 0.55 0.66  CALCIUM 8.1* 8.1*    PT/INR:  Recent Labs  01/05/15 2106  LABPROT 15.0  INR 1.16   Radiology: Dg Chest Port 1 View  01/08/2015   CLINICAL DATA:  Chest tube in place. Status post thoracotomy and empyema drainage.  EXAM: PORTABLE  CHEST - 1 VIEW  COMPARISON:  01/07/2015  FINDINGS: Right jugular central venous catheter projects over the mid SVC, unchanged. 2 right-sided chest tubes remain in place. Thoracic aortic calcification is noted. Cardiac silhouette appears mildly enlarged. There is persistent blunting of the costophrenic angles compatible with small pleural effusions, right larger than left. Patchy right basilar parenchymal lung opacity has mildly increased. Patchy retrocardiac opacity in the left lower lobe is stable to slightly improved. No pneumothorax is identified.  IMPRESSION: 1. Mildly increased right basilar opacity may represent increased atelectasis. Infection is not excluded. 2. Unchanged left basilar subsegmental atelectasis. 3. Small bilateral pleural effusions.   Electronically Signed   By: Logan Bores   On: 01/08/2015 08:05      Assessment/Plan: S/P Procedure(s) (LRB): VIDEO ASSISTED THORACOSCOPY (VATS)/EMPYEMA AND DRAINAGE OF EMPYEMA (Right) CXR stable, CT with no air leak. Continue CTs to water seal for now.  Hopefully can start to d/c CTs soon. Awaiting tx stepdown. ID- intraop cx's negative so far. IV to [facility], cvntinue ambulation, pulm toilet.  COLLINS,GINA H 01/08/2015 8:31 AM   DC anterior chest tube- leave posterior She may transfer to 2 [address] patient examined and medical record reviewed,agree with above note. Tharon Aquas Trigt III 01/08/2015

## 2015-01-09 ENCOUNTER — Inpatient Hospital Stay (HOSPITAL_COMMUNITY): Payer: Medicare Other

## 2015-01-09 LAB — CBC
HCT: 32.3 % — ABNORMAL LOW (ref 36.0–46.0)
Hemoglobin: 10.2 g/dL — ABNORMAL LOW (ref 12.0–15.0)
MCH: 27 pg (ref 26.0–34.0)
MCHC: 31.6 g/dL (ref 30.0–36.0)
MCV: 85.4 fL (ref 78.0–100.0)
Platelets: 416 10*3/uL — ABNORMAL HIGH (ref 150–400)
RBC: 3.78 MIL/uL — ABNORMAL LOW (ref 3.87–5.11)
RDW: 13.5 % (ref 11.5–15.5)
WBC: 8.5 10*3/uL (ref 4.0–10.5)

## 2015-01-09 MED ORDER — DM-GUAIFENESIN ER 30-600 MG PO TB12
1.0000 | ORAL_TABLET | Freq: Two times a day (BID) | ORAL | Status: DC | PRN
  Filled 2015-01-09: qty 1

## 2015-01-09 MED ORDER — CYCLOBENZAPRINE HCL 10 MG PO TABS
10.0000 mg | ORAL_TABLET | Freq: Every day | ORAL | Status: DC
  Administered 2015-01-09 – 2015-01-10 (×2): 10 mg via ORAL
  Filled 2015-01-09 (×4): qty 1

## 2015-01-09 NOTE — Progress Notes (Signed)
Patient was walked to the bathroom with front wheel rolling walker with RN, patient wanted time to sit on the toilet,  RN explained to patient to pull the string on the wall and wait for someone to assist her out of the bathroom, NT was called to room by the secretary; when NT arrived patient was coming out of the bathroom and stated, "I fell' patient further explained stating she hit her head on the wall while trying to stand, she did not fall to floor. Red patch on right side of forehead, currently not raised. VS stable BP 157/85 HR 88 O2 95% on room air. Paged MD. Will continue to monitor and assess.   Rowe Pavy, RN

## 2015-01-09 NOTE — Discharge Instructions (Signed)
Thoracotomy, Care After °Refer to this sheet in the next few weeks. These instructions provide you with information on caring for yourself after your procedure. Your health care provider may also give you more specific instructions. Your treatment has been planned according to current medical practices, but problems sometimes occur. Call your health care provider if you have any problems or questions after your procedure. °WHAT TO EXPECT AFTER YOUR PROCEDURE °After your procedure, it is typical to have the following sensations: °· You may feel pain at the incision site. °· You may be constipated from the pain medicine given and the change in your level of activity. °· You may feel extremely tired. °HOME CARE INSTRUCTIONS °· Take over-the-counter or prescription medicines for pain, discomfort, or fever only as directed by your health care provider. It is very important to take pain relieving medicine before your pain becomes severe. You will be able to breathe and cough more comfortably if your pain is well controlled. °· Take deep breaths. Deep breathing helps to keep your lungs inflated and protects against a lung infection (pneumonia). °· Cough frequently. Even though coughing may cause discomfort, coughing is important to clear mucus (phlegm) and expand your lungs. Coughing helps prevent pneumonia. If it hurts to cough, hold a pillow against your chest when you cough. This may help with the discomfort. °· Continue to use an incentive spirometer as directed. The use of an incentive spirometer helps to keep your lungs inflated and protects against pneumonia. °· Change the bandages over your incision as needed or as directed by your health care provider. °· Remove the bandages over your chest tube site as directed by your health care provider. °· Resume your normal diet as directed. It is important to have adequate protein, calories, vitamins, and minerals to promote healing. °· Prevent constipation. °¨ Eat  high-fiber foods such as whole grain cereals and breads, brown rice, beans, and fresh fruits and vegetables. °¨ Drink enough water and fluids to keep your urine clear or pale yellow. Avoid drinking beverages containing caffeine. Beverages containing caffeine can cause dehydration and harden your stool. °¨ Talk to your health care provider about taking a stool softener or laxative. °· Avoid lifting until you are instructed otherwise. °· Do not drive until directed by your health care provider.  Do not drive while taking pain medicines (narcotics). °· Do not bathe, swim, or use a hot tub until directed by your health care provider. You may shower instead. Gently wash the area of your incision with water and soap as directed. Do not use anything else to clean your incision except as directed by your health care provider. °· Do not use any tobacco products including cigarettes, chewing tobacco, or electronic cigarettes. °· Avoid secondhand smoke. °· Schedule an appointment for stitch (suture) or staple removal as directed. °· Schedule and attend all follow-up visits as directed by your health care provider. It is important to keep all your appointments. °· Participate in pulmonary rehabilitation as directed by your health care provider. °· Do not travel by airplane for 2 weeks after your chest tube is removed. °SEEK MEDICAL CARE IF: °· You are bleeding from your wounds. °· Your heartbeat seems irregular. °· You have redness, swelling, or increasing pain in the wounds. °· There is pus coming from your wounds. °· There is a bad smell coming from the wound or dressing. °· You have a fever or chills. °· You have nausea or are vomiting. °· You have muscle aches. °SEEK   IMMEDIATE MEDICAL CARE IF: °· You have a rash. °· You have difficulty breathing. °· You have a reaction or side effect to medicines given. °· You have persistent nausea. °· You have lightheadedness or feel faint. °· You have shortness of breath or chest  pain. °· You have persistent pain. °Document Released: 01/08/2011 Document Revised: 07/31/2013 Document Reviewed: 03/14/2013 °ExitCare® Patient Information ©2015 [employer]. This information is not intended to replace advice given to you by your health care provider. Make sure you discuss any questions you have with your health care provider. ° °

## 2015-01-09 NOTE — Progress Notes (Signed)
Patient ambulated in hallway 350 ft with front wheel rolling walker 1X, patient back in chair, call bell within reach.  Rowe Pavy, RN

## 2015-01-09 NOTE — Progress Notes (Signed)
DC chest tube per MD orders and protocol, DC fentanyl PCA per MD orders, DC On Q per MD orders.  Rowe Pavy, RN

## 2015-01-09 NOTE — Progress Notes (Signed)
15 mls of Fentanyl wasted in pyxis room with Doretha Sou, RN.  Rowe Pavy, RN

## 2015-01-09 NOTE — Progress Notes (Signed)
DC central line per MD order and protocol.  Rowe Pavy, RN

## 2015-01-09 NOTE — Discharge Summary (Signed)
Physician Discharge Summary  Patient ID: KAITELYN JAMISON MRN: [id number] DOB/AGE: 1936/02/17 79 y.o.  Admit date: 01/03/2015 Discharge date: 01/13/2015  Admission Diagnoses:  Patient Active Problem List   Diagnosis Date Noted  . Empyema of lung 01/06/2015  . Bronchiectasis without acute exacerbation 01/09/2014  . CAP (community acquired pneumonia) 01/02/2014   Discharge Diagnoses:   Patient Active Problem List   Diagnosis Date Noted  . Empyema of lung 01/06/2015  . Bronchiectasis without acute exacerbation 01/09/2014  . CAP (community acquired pneumonia) 01/02/2014   Discharged Condition: good  History of Present Illness:  Ms. Verner is a 79 yo old female with history of Bronchiectasis.  She was treated for a respiratory infection with oral antibiotics over the past few weeks.  Despite this her symptoms persisted with her experiencing chest pain, cough, shortness of breath and malaise.  Follow up visit she underwent CXR with showed a right pleural effusion.  She was referred for Thoracentesis with ultrasound guidance but no fluid was removed.  A CT scan of chest was obtained and the effusion was identified to be loculated and possibly an empyema.  She was admitted to the hospital for IV Antibiotics.   She has been afebrile and no leukocytosis.  It was felt she would require surgical intervention and TCTS was consulted.  Hospital Course:   She was evaluated by Dr. Prescott Gum on 01/04/2015 at which time he felt surgical intervention would be indicated.  The risks and benefits of the procedure were explained to the patient and she was agreeable to proceed.  She remained medically stable during hospitalization.  She was taken to the operating room on 01/06/2015.  She underwent right VATS with Mini Thoracotomy and Drainage/Decortication of the Empyema.  She tolerated the procedure without difficulty, was extubated and taken to the PACU in stable condition.  The patient did well post operatively.   Her chest tubes did not exhibit evidence of air leak and were transitioned to water seal.  Her chest tube drainage remained low.  Her chest tubes were successfully removed on POD #2 and #3.  Cultures obtained in the operating room are positive for rare Staph aureus.  Fungal and AFB cultures remain negative to date.  She will remain on Levaquin for a 7 day course from day of surgery.  She continues to do well.  She is tolerating a regular diet.  She is ambulating without difficulty.  Her pain is well controlled.  Should no further issues arise we anticipate discharge home  01/11/2015.  Consults: pulmonary/intensive care  Significant Diagnostic Studies: Pathology  1. Pleura, peel, Right - INFLAMED AND NECROTIC MESOTHELIAL LINED TISSUE WITH ASSOCIATED FIBRINOPURULENT MATERIAL. - THERE IS NO EVIDENCE OF MALIGNANCY. - SEE COMMENT. 2. Soft tissue, biopsy, Broncho-Pleural fistula - FIBRINOUS MATERIAL. - THERE IS NO EVIDENCE OF MALIGNANCY.  Microbiology:  TISSUE    Special Requests PLEURAL PEEL RIGHT SPEC D IN CUP   Gram Stain RARE WBC PRESENT,BOTH PMN AND MONONUCLEAR  NO ORGANISMS SEEN  Performed at [facility]       Culture RARE STAPHYLOCOCCUS AUREUS            Treatments: surgery:   1. Right VATS (video-assisted thoracoscopic surgery), mini  thoracotomy, drainage of loculated empyema, and decortication of  peel over right lower lobe. 2. Placement of wound On-Q pain analgesia system  Disposition: 01-Home or Self Care   Discharge Medications:   Follow-up Information    Follow up with Len Childs, MD  In 2 weeks.   Specialty:  Cardiothoracic Surgery   Contact information:   [address] [place] [address] [phone]       Follow up with [facility] In 2 weeks.   Why:  Please get CXR 30 min prior to your appointment   Contact information:   [facility]       Follow up with [facility].   Why:   Home Health RN   Contact information:   [address] [place] [address] [phone]       Signed: Ellwood Handler 01/13/2015, 7:38 AM

## 2015-01-09 NOTE — Progress Notes (Addendum)
      [address]       [place] [address]             [phone]       3 Days Post-Op Procedure(s) (LRB): VIDEO ASSISTED THORACOSCOPY (VATS)/EMPYEMA AND DRAINAGE OF EMPYEMA (Right)  Subjective: Patient aggravated this am because "things are beeping" and still with incisional pain.  Objective: Vital signs in last 24 hours: Temp:  [97.5 F (36.4 C)-99.1 F (37.3 C)] 97.6 F (36.4 C) (06/02 0500) Pulse Rate:  [81-100] 84 (06/02 0500) Cardiac Rhythm:  [-] Normal sinus rhythm (06/01 2200) Resp:  [14-22] 20 (06/02 0500) BP: (111-150)/(65-91) 150/75 mmHg (06/02 0500) SpO2:  [93 %-100 %] 93 % (06/02 0500) FiO2 (%):  [40 %] 40 % (06/01 1600) Weight:  [130 lb 11.2 oz (59.285 kg)] 130 lb 11.2 oz (59.285 kg) (06/02 0239)     Intake/Output from previous day: 06/01 0701 - 06/02 0700 In: 436.5 [P.O.:200; I.V.:236.5] Out: 2000 [HMCNO:7096; Chest Tube:50]   Physical Exam:  Cardiovascular: RRR Pulmonary: Clear to auscultation on left and diminished left base Abdomen: Soft, non tender, bowel sounds present. Wounds: Clean and dry.  No erythema or signs of infection. Chest Tube: no air leak and to water seal  Lab Results: CBC: Recent Labs  01/08/15 0430 01/09/15 0535  WBC 9.2 8.5  HGB 9.1* 10.2*  HCT 28.6* 32.3*  PLT 362 416*   BMET:  Recent Labs  01/07/15 0445 01/08/15 0430  NA 140 140  K 3.8 3.9  CL 108 106  CO2 26 26  GLUCOSE 144* 97  BUN 8 7  CREATININE 0.55 0.66  CALCIUM 8.1* 8.1*    PT/INR: No results for input(s): LABPROT, INR in the last 72 hours. ABG:  INR: Will add last result for INR, ABG once components are confirmed Will add last 4 CBG results once components are confirmed  Assessment/Plan:  1. CV - SR in the 80's. 2.  Pulmonary - Chest tubes with 50 cc last 24 hours. There is no air leak. CXR this am appears to show no pneumothorax, right base atelectasis and small bilateral pleural effusions. Likely remove chest tube. On 2  liters of oxygen via [place]. Will wean as tolerates. Encourage incentive spirometer. 3. ID-On Levaquin. Needs 10 days from time of surgery. 4. H and H stable at 10.2 and 32.3 5. Remove On Q 6. Stop PCA after chest tube is removed  ZIMMERMAN,DONIELLE MPA-C 01/09/2015,7:28 AM  will DC remaining tube Path with benign inflammation of pleural peel  patient examined and medical record reviewed,agree with above note. Tharon Aquas Trigt III 01/09/2015

## 2015-01-10 ENCOUNTER — Inpatient Hospital Stay (HOSPITAL_COMMUNITY): Payer: Medicare Other

## 2015-01-10 LAB — CBC
HCT: 32.8 % — ABNORMAL LOW (ref 36.0–46.0)
Hemoglobin: 10.5 g/dL — ABNORMAL LOW (ref 12.0–15.0)
MCH: 26.9 pg (ref 26.0–34.0)
MCHC: 32 g/dL (ref 30.0–36.0)
MCV: 84.1 fL (ref 78.0–100.0)
Platelets: 462 10*3/uL — ABNORMAL HIGH (ref 150–400)
RBC: 3.9 MIL/uL (ref 3.87–5.11)
RDW: 13.5 % (ref 11.5–15.5)
WBC: 7.6 10*3/uL (ref 4.0–10.5)

## 2015-01-10 LAB — CULTURE, BLOOD (ROUTINE X 2)
Culture: NO GROWTH
Culture: NO GROWTH

## 2015-01-10 LAB — TISSUE CULTURE: Culture: NO GROWTH

## 2015-01-10 NOTE — Progress Notes (Signed)
01/10/2015 08:25 PM The patient ambulated 400 ft in hallway with front wheel walker.  Patient tolerated the activity well.  Will continue to monitor patient. Lupita Dawn, RN

## 2015-01-10 NOTE — Care Management Note (Addendum)
Case Management Note  Patient Details  Name: Tina Bell MRN: [id number] Date of Birth: September 30, 1935  Subjective/Objective:    Right VATS                Action/Plan: Home with husband  Expected Discharge Date:   01/11/2015               Expected Discharge Plan:  [place]  In-House Referral:     Discharge planning Services  CM Consult  Post Acute Care Choice:  Home Health Choice offered to:  Spouse  DME Arranged:    DME Agency:     HH Arranged:  RN [place] Agency:  [facility]  Status of Service:  Completed, signed off  Medicare Important Message Given:  Yes Date Medicare IM Given:  01/10/15 Medicare IM give by:  Jonnie Finner RN CCM Date Additional Medicare IM Given:    Additional Medicare Important Message give by:     If discussed at [place] of Stay Meetings, dates discussed:    Additional Comments: NCM spoke to pt and gave permission to speak to spouse, [name]. Provided [facility] list and offered choice. Requested [facility] for [facility]. Pt has RW for home. Contacted [facility] for [facility] for scheduled dc tomorrow.  Erenest Rasher, RN 01/10/2015, 2:18 PM

## 2015-01-10 NOTE — Progress Notes (Addendum)
      [address]       [place] [address]             [phone]       4 Days Post-Op Procedure(s) (LRB): VIDEO ASSISTED THORACOSCOPY (VATS)/EMPYEMA AND DRAINAGE OF EMPYEMA (Right)   Subjective:  Tina Bell complains of pain along left side.  She states she isn't ready to go home today.  Objective: Vital signs in last 24 hours: Temp:  [97.8 F (36.6 C)-98.3 F (36.8 C)] 97.9 F (36.6 C) (06/03 0727) Pulse Rate:  [74-89] 74 (06/03 0727) Cardiac Rhythm:  [-] Normal sinus rhythm (06/03 0905) Resp:  [18-20] 18 (06/03 0727) BP: (122-128)/(65-85) 123/65 mmHg (06/03 0727) SpO2:  [93 %-94 %] 94 % (06/03 0727) Weight:  [125 lb (56.7 kg)] 125 lb (56.7 kg) (06/03 0346)  Intake/Output from previous day: 06/02 0701 - 06/03 0700 In: 250 [P.O.:250] Out: 1150 [Urine:1150]  General appearance: alert, cooperative and no distress Heart: regular rate and rhythm Lungs: clear to auscultation bilaterally Abdomen: soft, non-tender; bowel sounds normal; no masses,  no organomegaly Wound: clean and dry  Lab Results:  Recent Labs  01/09/15 0535 01/10/15 0635  WBC 8.5 7.6  HGB 10.2* 10.5*  HCT 32.3* 32.8*  PLT 416* 462*   BMET:  Recent Labs  01/08/15 0430  NA 140  K 3.9  CL 106  CO2 26  GLUCOSE 97  BUN 7  CREATININE 0.66  CALCIUM 8.1*    PT/INR: No results for input(s): LABPROT, INR in the last 72 hours. ABG    Component Value Date/Time   PHART 7.352 01/07/2015 0457   HCO3 25.0* 01/07/2015 0457   TCO2 26 01/07/2015 0457   ACIDBASEDEF 1.0 01/07/2015 0457   O2SAT 99.0 01/07/2015 0457   CBG (last 3)  No results for input(s): GLUCAP in the last 72 hours.  Assessment/Plan: S/P Procedure(s) (LRB): VIDEO ASSISTED THORACOSCOPY (VATS)/EMPYEMA AND DRAINAGE OF EMPYEMA (Right)  1. CV- hemodynamically stable 2. Pulm- chest tube removed yesterday, CXR free from pneumothorax, encouraged continued use of IS 3. ID- Empyema- continue Levaquin 4. Dispo- patient  stable, chest tubes out, minor pain at chest tube site.. Patient request H/H nursing.  Patient states not ready for d/c today, will reassess in AM   LOS: 7 days    Malai Lady 01/10/2015  patient examined and medical record reviewed,agree with above note. Should be ready to go home tomorrow-needs 10 days Levaquin AT [place] [facility] 01/10/2015

## 2015-01-11 LAB — CBC
HCT: 31.5 % — ABNORMAL LOW (ref 36.0–46.0)
Hemoglobin: 10.1 g/dL — ABNORMAL LOW (ref 12.0–15.0)
MCH: 26.7 pg (ref 26.0–34.0)
MCHC: 32.1 g/dL (ref 30.0–36.0)
MCV: 83.3 fL (ref 78.0–100.0)
Platelets: 401 10*3/uL — ABNORMAL HIGH (ref 150–400)
RBC: 3.78 MIL/uL — ABNORMAL LOW (ref 3.87–5.11)
RDW: 13.5 % (ref 11.5–15.5)
WBC: 7.3 10*3/uL (ref 4.0–10.5)

## 2015-01-11 LAB — ANAEROBIC CULTURE

## 2015-01-11 MED ORDER — LEVOFLOXACIN 500 MG PO TABS: 500.0000 mg | ORAL_TABLET | Freq: Every day | ORAL | Status: DC

## 2015-01-11 MED ORDER — TRAMADOL HCL 50 MG PO TABS: 50.0000 mg | ORAL_TABLET | Freq: Four times a day (QID) | ORAL | Status: DC | PRN

## 2015-01-11 MED ORDER — CYCLOBENZAPRINE HCL 5 MG PO TABS: 10.0000 mg | ORAL_TABLET | Freq: Three times a day (TID) | ORAL | Status: DC | PRN

## 2015-01-11 NOTE — Progress Notes (Signed)
IV and tele monitor d/c at this time; pt and husband given d/c instructions and prescriptions; pt to d/c home with husband; both verbalized understanding of d/c instructions; wlll cont. To monitor.

## 2015-01-11 NOTE — Progress Notes (Signed)
      [address]       [place] [address]             [phone]      5 Days Post-Op Procedure(s) (LRB): VIDEO ASSISTED THORACOSCOPY (VATS)/EMPYEMA AND DRAINAGE OF EMPYEMA (Right) Subjective: Some incisional discomfort, otherwise feels good  Objective: Vital signs in last 24 hours: Temp:  [98 F (36.7 C)-98.7 F (37.1 C)] 98.7 F (37.1 C) (06/04 0530) Pulse Rate:  [69-88] 88 (06/04 0530) Cardiac Rhythm:  [-] Normal sinus rhythm (06/04 0759) Resp:  [16-18] 18 (06/04 0530) BP: (110-135)/(65-69) 130/65 mmHg (06/04 0530) SpO2:  [94 %-99 %] 99 % (06/04 0530)  Hemodynamic parameters for last 24 hours:    Intake/Output from previous day: 06/03 0701 - 06/04 0700 In: 320 [P.O.:320] Out: -  Intake/Output this shift: Total I/O In: 60 [P.O.:60] Out: -   General appearance: alert, cooperative and no distress Heart: regular rate and rhythm Lungs: mildly dim on right Abdomen: benign Extremities: no edema Wound: incis healing well  Lab Results:  Recent Labs  01/10/15 0635 01/11/15 0250  WBC 7.6 7.3  HGB 10.5* 10.1*  HCT 32.8* 31.5*  PLT 462* 401*   BMET: No results for input(s): NA, K, CL, CO2, GLUCOSE, BUN, CREATININE, CALCIUM in the last 72 hours.  PT/INR: No results for input(s): LABPROT, INR in the last 72 hours. ABG    Component Value Date/Time   PHART 7.352 01/07/2015 0457   HCO3 25.0* 01/07/2015 0457   TCO2 26 01/07/2015 0457   ACIDBASEDEF 1.0 01/07/2015 0457   O2SAT 99.0 01/07/2015 0457   CBG (last 3)  No results for input(s): GLUCAP in the last 72 hours.  Meds Scheduled Meds: . acetaminophen  1,000 mg Oral 4 times per day   Or  . acetaminophen (TYLENOL) oral liquid 160 mg/5 mL  1,000 mg Oral 4 times per day  . antiseptic oral rinse  7 mL Mouth Rinse BID  . bisacodyl  10 mg Oral Daily  . calcium-vitamin D  1 tablet Oral BID  . cholecalciferol  1,000 Units Oral Daily  . cyclobenzaprine  10 mg Oral QHS  . enoxaparin (LOVENOX)  injection  30 mg Subcutaneous Q24H  . levofloxacin  500 mg Oral Daily  . mupirocin ointment   Nasal BID  . senna-docusate  1 tablet Oral QHS   Continuous Infusions: . dextrose 5 % and 0.9% NaCl 10 mL/hr at 01/08/15 1500   PRN Meds:.albuterol, dextromethorphan-guaiFENesin, hydrALAZINE, ondansetron (ZOFRAN) IV, ondansetron (ZOFRAN) IV, potassium chloride, traMADol  Xrays Dg Chest 2 View  01/10/2015   CLINICAL DATA:  Subsequent encounter for pneumothorax. Status post chest tube removal.  EXAM: CHEST  2 VIEW  COMPARISON:  01/09/2015.  FINDINGS: Right chest tube seen previously is been removed in the interval. No evidence for residual right-sided pneumothorax. There is persistent small right pleural effusion with right base atelectasis. Left lung is clear. The cardio pericardial silhouette is enlarged. Telemetry leads overlie the chest.  IMPRESSION: Interval removal of right chest tube without evidence for pneumothorax.  Persistent right small pleural effusion/pleural thickening with basilar atelectasis.   Electronically Signed   By: Misty Stanley M.D.   On: 01/10/2015 07:12    Assessment/Plan: S/P Procedure(s) (LRB): VIDEO ASSISTED THORACOSCOPY (VATS)/EMPYEMA AND DRAINAGE OF EMPYEMA (Right) Plan for discharge: see discharge orders   LOS: 8 days    Tina Bell E 01/11/2015

## 2015-01-13 DIAGNOSIS — J869 Pyothorax without fistula: Secondary | ICD-10-CM | POA: Diagnosis not present

## 2015-01-14 ENCOUNTER — Inpatient Hospital Stay (HOSPITAL_COMMUNITY)
Admission: AD | Admit: 2015-01-14 | Discharge: 2015-01-17 | DRG: 177 | Disposition: A | Discharge status: Skilled Nursing Facility | Payer: Medicare Other | Source: Ambulatory Visit | Attending: Pulmonary Disease | Admitting: Pulmonary Disease

## 2015-01-14 ENCOUNTER — Ambulatory Visit (INDEPENDENT_AMBULATORY_CARE_PROVIDER_SITE_OTHER): Payer: Medicare Other | Admitting: Adult Health

## 2015-01-14 ENCOUNTER — Inpatient Hospital Stay (HOSPITAL_COMMUNITY): Payer: Medicare Other

## 2015-01-14 ENCOUNTER — Telehealth: Payer: Self-pay | Admitting: *Deleted

## 2015-01-14 ENCOUNTER — Ambulatory Visit: Payer: Medicare Other | Admitting: Adult Health

## 2015-01-14 ENCOUNTER — Encounter: Payer: Self-pay | Admitting: Adult Health

## 2015-01-14 ENCOUNTER — Encounter (HOSPITAL_COMMUNITY): Payer: Self-pay | Admitting: *Deleted

## 2015-01-14 VITALS — BP 118/60 | HR 104 | Temp 98.6°F | Ht 63.0 in | Wt 120.0 lb

## 2015-01-14 DIAGNOSIS — R0789 Other chest pain: Secondary | ICD-10-CM | POA: Diagnosis present

## 2015-01-14 DIAGNOSIS — M1990 Unspecified osteoarthritis, unspecified site: Secondary | ICD-10-CM | POA: Diagnosis present

## 2015-01-14 DIAGNOSIS — R29898 Other symptoms and signs involving the musculoskeletal system: Secondary | ICD-10-CM | POA: Diagnosis not present

## 2015-01-14 DIAGNOSIS — Z79899 Other long term (current) drug therapy: Secondary | ICD-10-CM

## 2015-01-14 DIAGNOSIS — J479 Bronchiectasis, uncomplicated: Secondary | ICD-10-CM | POA: Diagnosis present

## 2015-01-14 DIAGNOSIS — J470 Bronchiectasis with acute lower respiratory infection: Secondary | ICD-10-CM | POA: Diagnosis not present

## 2015-01-14 DIAGNOSIS — G9340 Encephalopathy, unspecified: Secondary | ICD-10-CM

## 2015-01-14 DIAGNOSIS — Z87891 Personal history of nicotine dependence: Secondary | ICD-10-CM

## 2015-01-14 DIAGNOSIS — J869 Pyothorax without fistula: Secondary | ICD-10-CM

## 2015-01-14 DIAGNOSIS — E876 Hypokalemia: Secondary | ICD-10-CM | POA: Diagnosis present

## 2015-01-14 DIAGNOSIS — M1180 Other specified crystal arthropathies, unspecified site: Secondary | ICD-10-CM | POA: Diagnosis not present

## 2015-01-14 DIAGNOSIS — M109 Gout, unspecified: Secondary | ICD-10-CM | POA: Diagnosis present

## 2015-01-14 DIAGNOSIS — M064 Inflammatory polyarthropathy: Secondary | ICD-10-CM | POA: Diagnosis not present

## 2015-01-14 LAB — CBC WITH DIFFERENTIAL/PLATELET
Basophils Absolute: 0 10*3/uL (ref 0.0–0.1)
Basophils Relative: 0 % (ref 0–1)
Eosinophils Absolute: 0 10*3/uL (ref 0.0–0.7)
Eosinophils Relative: 0 % (ref 0–5)
HCT: 30.5 % — ABNORMAL LOW (ref 36.0–46.0)
Hemoglobin: 9.7 g/dL — ABNORMAL LOW (ref 12.0–15.0)
Lymphocytes Relative: 7 % — ABNORMAL LOW (ref 12–46)
Lymphs Abs: 1.2 10*3/uL (ref 0.7–4.0)
MCH: 26.9 pg (ref 26.0–34.0)
MCHC: 31.8 g/dL (ref 30.0–36.0)
MCV: 84.7 fL (ref 78.0–100.0)
Monocytes Absolute: 1.7 10*3/uL — ABNORMAL HIGH (ref 0.1–1.0)
Monocytes Relative: 9 % (ref 3–12)
Neutro Abs: 15.7 10*3/uL — ABNORMAL HIGH (ref 1.7–7.7)
Neutrophils Relative %: 84 % — ABNORMAL HIGH (ref 43–77)
Platelets: 406 10*3/uL — ABNORMAL HIGH (ref 150–400)
RBC: 3.6 MIL/uL — ABNORMAL LOW (ref 3.87–5.11)
RDW: 14.2 % (ref 11.5–15.5)
WBC: 18.5 10*3/uL — ABNORMAL HIGH (ref 4.0–10.5)

## 2015-01-14 LAB — COMPREHENSIVE METABOLIC PANEL
ALT: 16 U/L (ref 14–54)
AST: 24 U/L (ref 15–41)
Albumin: 2.7 g/dL — ABNORMAL LOW (ref 3.5–5.0)
Alkaline Phosphatase: 110 U/L (ref 38–126)
Anion gap: 11 (ref 5–15)
BUN: 19 mg/dL (ref 6–20)
CO2: 30 mmol/L (ref 22–32)
Calcium: 8.7 mg/dL — ABNORMAL LOW (ref 8.9–10.3)
Chloride: 97 mmol/L — ABNORMAL LOW (ref 101–111)
Creatinine, Ser: 0.82 mg/dL (ref 0.44–1.00)
GFR calc Af Amer: >60 mL/min (ref >60)
GFR calc non Af Amer: >60 mL/min (ref >60)
Glucose, Bld: 133 mg/dL — ABNORMAL HIGH (ref 65–99)
Potassium: 3.5 mmol/L (ref 3.5–5.1)
Sodium: 138 mmol/L (ref 135–145)
Total Bilirubin: 0.5 mg/dL (ref 0.3–1.2)
Total Protein: 7.1 g/dL (ref 6.5–8.1)

## 2015-01-14 LAB — TROPONIN I: Troponin I: <0.03 ng/mL (ref <0.031)

## 2015-01-14 LAB — BRAIN NATRIURETIC PEPTIDE: B Natriuretic Peptide: 49.2 pg/mL (ref 0.0–100.0)

## 2015-01-14 MED ORDER — CETYLPYRIDINIUM CHLORIDE 0.05 % MT LIQD
7.0000 mL | Freq: Two times a day (BID) | OROMUCOSAL | Status: DC
  Administered 2015-01-15 – 2015-01-17 (×5): 7 mL via OROMUCOSAL

## 2015-01-14 MED ORDER — ALBUTEROL SULFATE (2.5 MG/3ML) 0.083% IN NEBU: 2.5000 mg | INHALATION_SOLUTION | RESPIRATORY_TRACT | Status: DC | PRN

## 2015-01-14 MED ORDER — SODIUM CHLORIDE 0.9 % IV SOLN: 250.0000 mL | INTRAVENOUS | Status: DC | PRN

## 2015-01-14 MED ORDER — CHLORHEXIDINE GLUCONATE 0.12 % MT SOLN
15.0000 mL | Freq: Two times a day (BID) | OROMUCOSAL | Status: DC
Start: 2015-01-14 — End: 2015-01-17
  Administered 2015-01-14 – 2015-01-17 (×6): 15 mL via OROMUCOSAL
  Filled 2015-01-14 (×8): qty 15

## 2015-01-14 MED ORDER — ACETAMINOPHEN 650 MG RE SUPP: 650.0000 mg | Freq: Four times a day (QID) | RECTAL | Status: DC | PRN

## 2015-01-14 MED ORDER — ACETAMINOPHEN 325 MG PO TABS
650.0000 mg | ORAL_TABLET | Freq: Four times a day (QID) | ORAL | Status: DC | PRN
  Administered 2015-01-14 – 2015-01-16 (×5): 650 mg via ORAL
  Filled 2015-01-14 (×5): qty 2

## 2015-01-14 MED ORDER — HEPARIN SODIUM (PORCINE) 5000 UNIT/ML IJ SOLN
5000.0000 [IU] | Freq: Three times a day (TID) | INTRAMUSCULAR | Status: DC
  Administered 2015-01-14 – 2015-01-17 (×8): 5000 [IU] via SUBCUTANEOUS
  Filled 2015-01-14 (×11): qty 1

## 2015-01-14 MED ORDER — SENNOSIDES-DOCUSATE SODIUM 8.6-50 MG PO TABS: 1.0000 | ORAL_TABLET | Freq: Every evening | ORAL | Status: DC | PRN

## 2015-01-14 MED ORDER — SODIUM CHLORIDE 0.9 % IJ SOLN
3.0000 mL | Freq: Two times a day (BID) | INTRAMUSCULAR | Status: DC
  Administered 2015-01-16: 3 mL via INTRAVENOUS

## 2015-01-14 MED ORDER — ONDANSETRON HCL 4 MG/2ML IJ SOLN: 4.0000 mg | Freq: Four times a day (QID) | INTRAMUSCULAR | Status: DC | PRN

## 2015-01-14 MED ORDER — ONDANSETRON HCL 4 MG PO TABS: 4.0000 mg | ORAL_TABLET | Freq: Four times a day (QID) | ORAL | Status: DC | PRN

## 2015-01-14 MED ORDER — ENSURE ENLIVE PO LIQD
237.0000 mL | Freq: Two times a day (BID) | ORAL | Status: DC
  Administered 2015-01-15 – 2015-01-17 (×5): 237 mL via ORAL

## 2015-01-14 MED ORDER — LEVOFLOXACIN IN D5W 750 MG/150ML IV SOLN
750.0000 mg | INTRAVENOUS | Status: DC
  Administered 2015-01-14 – 2015-01-15 (×2): 750 mg via INTRAVENOUS
  Filled 2015-01-14 (×2): qty 150

## 2015-01-14 MED ORDER — POTASSIUM CHLORIDE CRYS ER 20 MEQ PO TBCR
40.0000 meq | EXTENDED_RELEASE_TABLET | Freq: Once | ORAL | Status: AC
  Administered 2015-01-14: 40 meq via ORAL
  Filled 2015-01-14: qty 2

## 2015-01-14 MED ORDER — SODIUM CHLORIDE 0.9 % IJ SOLN: 3.0000 mL | INTRAMUSCULAR | Status: DC | PRN

## 2015-01-14 NOTE — Assessment & Plan Note (Signed)
right VATS with drainage and decortication of a right lower lobe empyema.. Patient is doing very poorly since her surgery and discharged home Unclear what is contributing to her decline. She will be admitted . Further evaluation and treatment See H&P admission note

## 2015-01-14 NOTE — H&P (Signed)
Name: Tina Bell MRN: [id number] DOB: 1936-08-03    ADMISSION DATE:  (Not on file)    CHIEF COMPLAINT:  Very weak   BRIEF PATIENT DESCRIPTION: 79 year old female with a history of bronchiectasis developed community-acquired pneumonia in May 2016 and presented to the pulmonary office on May 26 with a new right-sided pleural effusion. Thoracentesis was attempted but no fluid could be removed. Admitted 5/27 with CT showing large RLL opacity with loculated moderate right pleural effusion. She underwent right VATS with drainage and decortication of a right lower lobe empyema.. Admitted from office with severe right sided chest wall pain and weakness  6/7 .    SIGNIFICANT EVENTS   01/06/15 VATS right   STUDIES:  01/03/2015 CT chest images personally reviewed, radiology report pending> there is consolidation and it least a moderate sized pleural effusion on the right side some bronchiectasis noted  . Pleura, peel, Right - INFLAMED AND NECROTIC MESOTHELIAL LINED TISSUE WITH ASSOCIATED FIBRINOPURULENT MATERIAL. - THERE IS NO EVIDENCE OF MALIGNANCY. - SEE COMMENT. 2. Soft tissue, biopsy, Broncho-Pleural fistula - FIBRINOUS MATERIAL. - THERE IS NO EVIDENCE OF MALIGNANCY.   HISTORY OF PRESENT ILLNESS:   01/14/2015 Acute OV  Patient presents for an acute office visit with her husband, She was diagnosed with a right lower lobe pneumonia with pleural effusion. 2 weeks ago. She was treated with the antibodies without any significant improvement.  Was unable to drain her effusion with thoracentesis. CT chest showed an effusion to be loculated with a possible empyema. She required hospitalization and thoracic surgery was consulted.  She underwent a right VATS with drainage and decortication of a right lower lobe empyema.. Pathology showed inflamed and necrotic mesothelial lined tissue with associated fibrinnopurulent material, no evidence of malignancy. She was treated with IV antibodies,  and transitioned to Levaquin prior to discharge. Husband brings her in today for an acute office visit reporting that she has been extremely weak, unable to walk and is not eating very well. She has significant pain along the right chest wall. The planes of knee pain and joint pain diffusely. He says he is unable to care for her at home as she cannot do anything for herself. He had to get 3 people to help him get her into the car today. In the office. She is extremely weak and complains of pain along her right chest wall. She denies any hemoptysis, fever, incision drainage, hemoptysis, nausea, vomiting, diarrhea or productive cough.   PAST MEDICAL HISTORY :   has a past medical history of Pneumonia (1997); History of kidney stones (9702,6378); Arthritis; Complication of anesthesia; seasonal allergies; and Pleural effusion (12/2014).  has past surgical history that includes Bladder suspension (1998); Knee arthroscopy (Right, 1999); Nose surgery (1989); Dilation and curettage of uterus; and Video assisted thoracoscopy (vats)/empyema (Right, 01/06/2015). Prior to Admission medications   Medication Sig Start Date End Date Taking? Authorizing Provider  calcium-vitamin D (OSCAL WITH D) 500-200 MG-UNIT per tablet Take 1 tablet by mouth 2 (two) times daily.     Historical Provider, MD  cyclobenzaprine (FLEXERIL) 5 MG tablet Take 2 tablets (10 mg total) by mouth 3 (three) times daily as needed for muscle spasms. 01/11/15   Wayne E Gold, PA-C  Krill Oil 1000 MG CAPS Take 1 capsule by mouth 2 (two) times daily.     Historical Provider, MD  levofloxacin (LEVAQUIN) 500 MG tablet Take 1 tablet (500 mg total) by mouth daily. 01/11/15   John Giovanni, PA-C  Multiple  Vitamins-Minerals (WOMENS MULTI VITAMIN & MINERAL PO) Take by mouth daily.    Historical Provider, MD  Respiratory Therapy Supplies (FLUTTER) DEVI Use 2-4 times daily 07/23/14   Juanito Doom, MD  traMADol (ULTRAM) 50 MG tablet Take 1-2 tablets (50-100  mg total) by mouth every 6 (six) hours as needed (mild pain). 01/11/15   Wayne E Gold, PA-C  vitamin C (ASCORBIC ACID) 500 MG tablet Take 500 mg by mouth daily.    Historical Provider, MD   Allergies  Allergen Reactions  . Keflex [Cephalexin] Hives    Tolerated Augmentin  . Morphine And Related Hives    FAMILY HISTORY:  family history includes Cancer in her mother and sister. SOCIAL HISTORY:  reports that she quit smoking about 52 years ago. Her smoking use included Cigarettes. She has a 1 pack-year smoking history. She has never used smokeless tobacco. She reports that she drinks alcohol. She reports that she does not use illicit drugs.  REVIEW OF SYSTEMS:    Constitutional: No weight loss, night sweats, Fevers, chills, +fatigue, or lassitude.  HEENT: No headaches, Difficulty swallowing, Tooth/dental problems, or Sore throat,   No sneezing, itching, ear ache, nasal congestion, post nasal drip,   CV: + chest wall pain, Orthopnea, PND, swelling in lower extremities, anasarca, dizziness, palpitations, syncope.   GI No abdominal pain, nausea, vomiting, diarrhea, change in bowel habits, loss of appetite, bloody stools.   Resp: No chest wall deformity  Skin: no rash or lesions.  GU: no dysuria, change in color of urine, no urgency or frequency. No flank pain, no hematuria   MS: No joint pain or swelling. No decreased range of motion. No back pain.  Psych: No change in mood or affect. No depression or anxiety. No memory loss.  SUBJECTIVE:  Very weak.   VITAL SIGNS: Temp:  [98.6 F (37 C)] 98.6 F (37 C) (06/07 1504) Pulse Rate:  [104] 104 (06/07 1504) BP: (118)/(60) 118/60 mmHg (06/07 1504) SpO2:  [91 %] 91 % (06/07 1504) Weight:  [120 lb (54.432 kg)] 120 lb (54.432 kg) (06/07 1504)  PHYSICAL EXAMINATION: GEN: A/Ox3; frail and thin, in wheelchair -, slumped over  HEENT: [place]/AT, EACs-clear, TMs-wnl, NOSE-clear, THROAT-clear, no lesions,  no postnasal drip or exudate noted.   NECK: Supple w/ fair ROM; no JVD; normal carotid impulses w/o bruits; no thyromegaly or nodules palpated; no lymphadenopathy.   RESP decreased breath sounds, right greater than left no accessory muscle use, no dullness to percussion, dressing along right lateral chest wall .   CARD: RRR, no m/r/g , no peripheral edema, pulses intact, no cyanosis or clubbing.  GI: Soft & nt; nml bowel sounds; no organomegaly or masses detected.   Musco: Warm bil, no deformities or joint swelling noted.   Neuro: alert, no focal deficits noted.   Skin: Warm, no lesions or rashes   Recent Labs Lab 01/08/15 0430  NA 140  K 3.9  CL 106  CO2 26  BUN 7  CREATININE 0.66  GLUCOSE 97    Recent Labs Lab 01/09/15 0535 01/10/15 0635 01/11/15 0250  HGB 10.2* 10.5* 10.1*  HCT 32.3* 32.8* 31.5*  WBC 8.5 7.6 7.3  PLT 416* 462* 401*   No results found.  ASSESSMENT / PLAN: 1.  Right Lower lobe Empyema s/p right VATS with drainage and decortication secondary to CAP  With acute chest wall pain and decompensation ? Etiology   Plan:  Admit to [facility] med surg Check cxr  Place on IV abx  with Levaquin  Labs with bnp, troponin and cbc  Hep /DVT prophy    2. Bronchiectasis   Plan  F/up cxr  Flutter valve mucinex   3. Chest wall pain   Plan  ekg  Cycle enzymes   Will need SW consult for rehab discharge.     [facility] NP-C  Pulmonary and [place] Pager: [phone]  01/14/2015, 4:39 PM

## 2015-01-14 NOTE — Progress Notes (Signed)
[place] Progress Note Patient Name: YOULANDA TOMASSETTI DOB: 1936/03/16 MRN: [id number]   Date of Service  01/14/2015  HPI/Events of Note  K+ = 3.5 and Creatinine = 0.82  eICU Interventions  Hypokalemia -repleted     Intervention Category Intermediate Interventions: Electrolyte abnormality - evaluation and management  Lysle Dingwall 01/14/2015, 8:07 PM

## 2015-01-14 NOTE — Progress Notes (Signed)
Seen and examined with Tammy, her biggest complaint today is worsening chest pain, debility, though she does not look well.  We need to get labwork and a chest x-ray to follow up on what is going on her. Plan to admit, I will be the attending.  Continue antibiotics for now.

## 2015-01-14 NOTE — Telephone Encounter (Signed)
I called to discuss some medication issues that the home health nurse had brought to our attention and the fact that she had bee overly drowsy on her visit.  Immediately, Mr. Custer said that his wife needed to be in a skilled facility.  She was all right when she first came home.  But she has been immobile since being home and she is in so much pain with her knees which is an ongoing problem (due to have surgery).  I said we would be unable to place her in a facility, but she shoul have her see her PCP or orthopedist.  Also, PT in the home may help.  He said that that had been arranged through Dr. Anastasia Pall office and someone was coming today at 5 pm.  I told him to stop her Calcium with D and Ester-C while on the antibiotic, but may resume when completed.  Also, due to her grogginess, he should stop giving her the Flexeril while she is on the Tramadol, even reducing that to one tab instead of two.  He agreed since he said she was "zonked".  I will check on her status tomorrow.

## 2015-01-14 NOTE — Progress Notes (Signed)
Subjective:    Patient ID: Tina Bell, female    DOB: 01-25-1936, 79 y.o.   MRN: [id number]  HPI Synopsis: First referred to [facility] pulmonary in 2015. Found to have bronchiectasis on high-resolution CT in 2015 this is believed to be due to a severe pertussis infection as a child. Never smoked 12/2013 CT chest high res ([place]rikin read)> scattered throughout the lungs, particularly in the lingula and RML, there is cylindrical bronchiectasis and bronchiolectasis with nodularity suggestive of MAI, extensive air trapping and atherosclerosis, small hiatal hernia June 2015 pulmonary function test> normal ratio, FEV1 93% predicted, no change with bronchodilator, total lung capacity normal, DLCO slightly low at 73% predicted  01/14/2015 Acute OV   Patient presents for an acute office visit with her husband, She was diagnosed with a right lower lobe pneumonia with pleural effusion. 2 weeks ago. She was treated with the antibodies without any significant improvement.  Was unable to drain her effusion with thoracentesis. CT chest showed an effusion to be loculated with a possible empyema. She required hospitalization and thoracic surgery was consulted.  She underwent a right VATS with drainage and decortication of a right lower lobe empyema.. Pathology showed inflamed and necrotic mesothelial lined tissue with associated fibrinnopurulent material, no evidence of malignancy. She was treated with IV antibodies, and transitioned to Levaquin prior to discharge. Husband brings her in today for an acute office visit reporting that she has been extremely weak, unable to walk and is not eating very well. She has significant pain along the right chest wall. The planes of knee pain and joint pain diffusely. He says he is unable to care for her at home as she cannot do anything for herself. He had to get 3 people to help him get her into the car today. In the office. She is extremely weak and complains of pain  along her right chest wall. She denies any hemoptysis, fever, incision drainage, hemoptysis, nausea, vomiting, diarrhea or productive cough.    Review of Systems Constitutional:   No  weight loss, night sweats,  Fevers, chills, +fatigue, or  lassitude.  HEENT:   No headaches,  Difficulty swallowing,  Tooth/dental problems, or  Sore throat,                No sneezing, itching, ear ache, nasal congestion, post nasal drip,   CV:  + chest wall  pain,  Orthopnea, PND, swelling in lower extremities, anasarca, dizziness, palpitations, syncope.   GI  No   abdominal pain, nausea, vomiting, diarrhea, change in bowel habits, loss of appetite, bloody stools.   Resp:   No chest wall deformity  Skin: no rash or lesions.  GU: no dysuria, change in color of urine, no urgency or frequency.  No flank pain, no hematuria   MS:  No joint pain or swelling.  No decreased range of motion.  No back pain.  Psych:  No change in mood or affect. No depression or anxiety.  No memory loss.         Objective:   Physical Exam GEN: A/Ox3;  frail and thin, in wheelchair -, slumped over   HEENT:  [place]/AT,  EACs-clear, TMs-wnl, NOSE-clear, THROAT-clear, no lesions, no postnasal drip or exudate noted.   NECK:  Supple w/ fair ROM; no JVD; normal carotid impulses w/o bruits; no thyromegaly or nodules palpated; no lymphadenopathy.  RESP   decreased breath sounds, right greater than left no accessory muscle use, no dullness to percussion  CARD:  RRR, no m/r/g  , no peripheral edema, pulses intact, no cyanosis or clubbing.  GI:   Soft & nt; nml bowel sounds; no organomegaly or masses detected.  Musco: Warm bil, no deformities or joint swelling noted.   Neuro: alert, no focal deficits noted.    Skin: Warm, no lesions or rashes         Assessment & Plan:

## 2015-01-14 NOTE — Progress Notes (Signed)
Dear Doctor: Tina Bell This patient has been identified as a candidate for PICC for the following reason (s): IV therapy over 48 hours, drug pH or osmolality (causing phlebitis, infiltration in 24 hours) and poor veins/poor circulatory system (CHF, COPD, emphysema, diabetes, steroid use, IV drug abuse, etc.) If you agree, please write an order for the indicated device. For any questions contact the Vascular Access Team at [phone] if no answer, please leave a message.  Thank you for supporting the early vascular access assessment program.

## 2015-01-15 DIAGNOSIS — M064 Inflammatory polyarthropathy: Secondary | ICD-10-CM

## 2015-01-15 LAB — CBC
HCT: 31.3 % — ABNORMAL LOW (ref 36.0–46.0)
Hemoglobin: 9.9 g/dL — ABNORMAL LOW (ref 12.0–15.0)
MCH: 27 pg (ref 26.0–34.0)
MCHC: 31.6 g/dL (ref 30.0–36.0)
MCV: 85.5 fL (ref 78.0–100.0)
Platelets: 432 10*3/uL — ABNORMAL HIGH (ref 150–400)
RBC: 3.66 MIL/uL — ABNORMAL LOW (ref 3.87–5.11)
RDW: 14.1 % (ref 11.5–15.5)
WBC: 14.5 10*3/uL — ABNORMAL HIGH (ref 4.0–10.5)

## 2015-01-15 LAB — BASIC METABOLIC PANEL
Anion gap: 12 (ref 5–15)
BUN: 14 mg/dL (ref 6–20)
CO2: 28 mmol/L (ref 22–32)
Calcium: 9 mg/dL (ref 8.9–10.3)
Chloride: 101 mmol/L (ref 101–111)
Creatinine, Ser: 0.79 mg/dL (ref 0.44–1.00)
GFR calc Af Amer: >60 mL/min (ref >60)
GFR calc non Af Amer: >60 mL/min (ref >60)
Glucose, Bld: 93 mg/dL (ref 65–99)
Potassium: 3.5 mmol/L (ref 3.5–5.1)
Sodium: 141 mmol/L (ref 135–145)

## 2015-01-15 LAB — SYNOVIAL CELL COUNT + DIFF, W/ CRYSTALS
Eosinophils-Synovial: 0 % (ref 0–1)
Lymphocytes-Synovial Fld: 2 % (ref 0–20)
Monocyte-Macrophage-Synovial Fluid: 7 % — ABNORMAL LOW (ref 50–90)
Neutrophil, Synovial: 91 % — ABNORMAL HIGH (ref 0–25)
WBC, Synovial: 6500 /mm3 — ABNORMAL HIGH (ref 0–200)

## 2015-01-15 LAB — TROPONIN I
Troponin I: <0.03 ng/mL (ref <0.031)
Troponin I: <0.03 ng/mL (ref <0.031)
Troponin I: <0.03 ng/mL (ref <0.031)
Troponin I: <0.03 ng/mL (ref <0.031)

## 2015-01-15 LAB — GLUCOSE, CAPILLARY: Glucose-Capillary: 103 mg/dL — ABNORMAL HIGH (ref 65–99)

## 2015-01-15 MED ORDER — KETOROLAC TROMETHAMINE 15 MG/ML IJ SOLN
15.0000 mg | Freq: Once | INTRAMUSCULAR | Status: AC
  Administered 2015-01-15: 15 mg via INTRAVENOUS
  Filled 2015-01-15: qty 1

## 2015-01-15 NOTE — Consult Note (Addendum)
Reason for Consult:  Left knee pain Referring Physician: Dr. Madelin Headings Tina Bell is an 79 y.o. female.  HPI: 79 y/o female with h/o bilat knee arthritis is admitted at this time for an emypema.  Over the last two days her left knee has become more swollen painful.  She c/o moderate aching pain at rest and prefers to keep her knee slightly bent.  She says the pain becomes much more severe with WB.  She denies any injury to the knee.  No increase in activity two days ago.  She denies any h/o gout.  She denies f/c/n/v/wt loss.  She has family at her bedside.  I'm consulted to evaluate her knee pain.  She's on levaquin and heparin.  Past Medical History  Diagnosis Date  . Pneumonia 1997  . History of kidney stones 4010,2725  . Arthritis     knees  . Complication of anesthesia     difficulty waking up from anesthesia  . Hx of seasonal allergies   . Pleural effusion 12/2014    Past Surgical History  Procedure Laterality Date  . Bladder suspension  1998  . Knee arthroscopy Right 1999  . Nose surgery  1989    repair of deviated septum  . Dilation and curettage of uterus      3 procedures in the 1980's for spotting  . Video assisted thoracoscopy (vats)/empyema Right 01/06/2015    Procedure: VIDEO ASSISTED THORACOSCOPY (VATS)/EMPYEMA AND DRAINAGE OF EMPYEMA;  Surgeon: Ivin Poot, MD;  Location: [facility];  Service: Thoracic;  Laterality: Right;    Family History  Problem Relation Age of Onset  . Cancer Mother     ovarian  . Cancer Sister     breast    Social History:  reports that she quit smoking about 52 years ago. Her smoking use included Cigarettes. She has a 1 pack-year smoking history. She has never used smokeless tobacco. She reports that she drinks alcohol. She reports that she does not use illicit drugs.  Allergies:  Allergies  Allergen Reactions  . Keflex [Cephalexin] Hives    Tolerated Augmentin  . Morphine And Related Hives    Medications: I have reviewed the  patient's current medications.  Results for orders placed or performed during the hospital encounter of 01/14/15 (from the past 48 hour(s))  Comprehensive metabolic panel     Status: Abnormal   Collection Time: 01/14/15  6:15 PM  Result Value Ref Range   Sodium 138 135 - 145 mmol/L   Potassium 3.5 3.5 - 5.1 mmol/L   Chloride 97 (L) 101 - 111 mmol/L   CO2 30 22 - 32 mmol/L   Glucose, Bld 133 (H) 65 - 99 mg/dL   BUN 19 6 - 20 mg/dL   Creatinine, Ser 0.82 0.44 - 1.00 mg/dL   Calcium 8.7 (L) 8.9 - 10.3 mg/dL   Total Protein 7.1 6.5 - 8.1 g/dL   Albumin 2.7 (L) 3.5 - 5.0 g/dL   AST 24 15 - 41 U/L   ALT 16 14 - 54 U/L   Alkaline Phosphatase 110 38 - 126 U/L   Total Bilirubin 0.5 0.3 - 1.2 mg/dL   GFR calc non Af Amer >60 >60 mL/min   GFR calc Af Amer >60 >60 mL/min    Comment: (NOTE) The eGFR has been calculated using the CKD EPI equation. This calculation has not been validated in all clinical situations. eGFR's persistently <60 mL/min signify possible Chronic Kidney Disease.    Anion  gap 11 5 - 15  CBC WITH DIFFERENTIAL     Status: Abnormal   Collection Time: 01/14/15  6:15 PM  Result Value Ref Range   WBC 18.5 (H) 4.0 - 10.5 K/uL   RBC 3.60 (L) 3.87 - 5.11 MIL/uL   Hemoglobin 9.7 (L) 12.0 - 15.0 g/dL   HCT 30.5 (L) 36.0 - 46.0 %   MCV 84.7 78.0 - 100.0 fL   MCH 26.9 26.0 - 34.0 pg   MCHC 31.8 30.0 - 36.0 g/dL   RDW 14.2 11.5 - 15.5 %   Platelets 406 (H) 150 - 400 K/uL   Neutrophils Relative % 84 (H) 43 - 77 %   Neutro Abs 15.7 (H) 1.7 - 7.7 K/uL   Lymphocytes Relative 7 (L) 12 - 46 %   Lymphs Abs 1.2 0.7 - 4.0 K/uL   Monocytes Relative 9 3 - 12 %   Monocytes Absolute 1.7 (H) 0.1 - 1.0 K/uL   Eosinophils Relative 0 0 - 5 %   Eosinophils Absolute 0.0 0.0 - 0.7 K/uL   Basophils Relative 0 0 - 1 %   Basophils Absolute 0.0 0.0 - 0.1 K/uL  Brain natriuretic peptide     Status: None   Collection Time: 01/14/15  6:15 PM  Result Value Ref Range   B Natriuretic Peptide 49.2  0.0 - 100.0 pg/mL  Troponin I     Status: None   Collection Time: 01/14/15  6:15 PM  Result Value Ref Range   Troponin I <0.03 <0.031 ng/mL    Comment:        NO INDICATION OF MYOCARDIAL INJURY.   Troponin I     Status: None   Collection Time: 01/14/15 11:28 PM  Result Value Ref Range   Troponin I <0.03 <0.031 ng/mL    Comment:        NO INDICATION OF MYOCARDIAL INJURY.   Troponin I     Status: None   Collection Time: 01/15/15  5:25 AM  Result Value Ref Range   Troponin I <0.03 <0.031 ng/mL    Comment:        NO INDICATION OF MYOCARDIAL INJURY.   Basic metabolic panel     Status: None   Collection Time: 01/15/15  5:25 AM  Result Value Ref Range   Sodium 141 135 - 145 mmol/L   Potassium 3.5 3.5 - 5.1 mmol/L   Chloride 101 101 - 111 mmol/L   CO2 28 22 - 32 mmol/L   Glucose, Bld 93 65 - 99 mg/dL   BUN 14 6 - 20 mg/dL   Creatinine, Ser 0.79 0.44 - 1.00 mg/dL   Calcium 9.0 8.9 - 10.3 mg/dL   GFR calc non Af Amer >60 >60 mL/min   GFR calc Af Amer >60 >60 mL/min    Comment: (NOTE) The eGFR has been calculated using the CKD EPI equation. This calculation has not been validated in all clinical situations. eGFR's persistently <60 mL/min signify possible Chronic Kidney Disease.    Anion gap 12 5 - 15  CBC     Status: Abnormal   Collection Time: 01/15/15  5:25 AM  Result Value Ref Range   WBC 14.5 (H) 4.0 - 10.5 K/uL   RBC 3.66 (L) 3.87 - 5.11 MIL/uL   Hemoglobin 9.9 (L) 12.0 - 15.0 g/dL   HCT 31.3 (L) 36.0 - 46.0 %   MCV 85.5 78.0 - 100.0 fL   MCH 27.0 26.0 - 34.0 pg   MCHC 31.6 30.0 -  36.0 g/dL   RDW 14.1 11.5 - 15.5 %   Platelets 432 (H) 150 - 400 K/uL  Glucose, capillary     Status: Abnormal   Collection Time: 01/15/15  7:19 AM  Result Value Ref Range   Glucose-Capillary 103 (H) 65 - 99 mg/dL   Comment 1 Notify RN   Troponin I     Status: None   Collection Time: 01/15/15 11:40 AM  Result Value Ref Range   Troponin I <0.03 <0.031 ng/mL    Comment:        NO  INDICATION OF MYOCARDIAL INJURY.   Troponin I     Status: None   Collection Time: 01/15/15  5:35 PM  Result Value Ref Range   Troponin I <0.03 <0.031 ng/mL    Comment:        NO INDICATION OF MYOCARDIAL INJURY.     Portable Chest 1 View  01/14/2015   CLINICAL DATA:  Empyema lung.  Right-sided chest pain at incision.  EXAM: PORTABLE CHEST - 1 VIEW  COMPARISON:  01/10/2015  FINDINGS: Persistent small right pleural effusion and right base atelectasis or scarring, stable. No pneumothorax. Left lung is clear. Heart is upper limits normal in size. Diffuse interstitial prominence throughout the lungs, likely chronic interstitial lung disease. No acute bony abnormality.  IMPRESSION: Stable small right pleural effusion with right basilar atelectasis or scarring.  Stable chronic interstitial prominence.   Electronically Signed   By: Rolm Baptise M.D.   On: 01/14/2015 18:18   ROS:  As above PE:  Blood pressure 118/72, pulse 86, temperature 98 F (36.7 C), temperature source Oral, resp. rate 18, height _0  (1.6 m), weight 58.514 kg (129 lb), SpO2 98 %. Thin, somewhat ill appearing elderly woman in nad.  A and O x 4.  Mood and affect normal.  EOMI.  resp slightly labored.  L knee with large effusion.  ROM 0-75.  4/5 strength at the quad limited by pain.  5/5 strength in ankleDF and PF.  No lymphadenopathy.  Skin is healthy and intact.  No erythema.  Mild warmth.  TTP at suprapatellar pouch.  2+ dp and pt pulses.  Feels LT normally about the L LE.  Assessment/Plan: L knee effusion - in this setting the differential includes gout, pyarthrosis and reactive effusion from her arthritis.  It's medically necessary to aspirate the knee to further evaluate the cause of the effusion.  She understands the risks and benefits of the proposed procedure and the alternatives and would like to proceed.  PROCEDURE:  After informed consent and sterile prep, I aspirated 45 cc of slightly cloudy straw colored fluid from the  knee.  She tolerated the procedure well, and there were no evident complications.  Fluid sent for aerobic and anaerobic culture, gram stain, cell count, differential and crystal exam.  She can bear weight as tolerated.  Further treatment recs based on results of cell count, gram stain and crystal exam.  Oluwadamilare Tobler 01/15/2015, 8:00 PM    Addendum:  Cell count was 6k and crystal exam showed intracellular and extracellular monosodium urate crystals.  A:  L knee gout  P:  Recommend starting pt on colchrys or indomethacin for a few days.  Alternatively a short course of oral steroids could be considered.  She can bear weight as tolerated.  I'll sign off.  Please call with any questions.  [phone]

## 2015-01-15 NOTE — Care Management Note (Signed)
Case Management Note  Patient Details  Name: Tina Bell MRN: [id number] Date of Birth: [birth year]  Subjective/Objective:       79 yo admitted with empyema lung             Action/Plan: From home with spouse and had [facility]   Expected Discharge Date:   (unknown)               Expected Discharge Plan:  [place]  In-House Referral:     Discharge planning Services  CM Consult  Post Acute Care Choice:    Choice offered to:     DME Arranged:    DME Agency:     HH Arranged:  RN [place] Agency:  [place]  Status of Service:  In process, will continue to follow  Medicare Important Message Given:    Date Medicare IM Given:    Medicare IM give by:    Date Additional Medicare IM Given:    Additional Medicare Important Message give by:     If discussed at [place] of Stay Meetings, dates discussed:    Additional Comments: [facility] rep alerted pt in the hospital. Will need resumption orders at DC. CM will continue to follow. Lynnell Catalan, RN 01/15/2015, 1:57 PM

## 2015-01-15 NOTE — Progress Notes (Signed)
Initial Nutrition Assessment  DOCUMENTATION CODES:  Not applicable  INTERVENTION: - Continue Ensure Enlive po BID, each supplement provides 350 kcal and 20 grams of protein - RD will continue to monitor for needs  NUTRITION DIAGNOSIS:  Increased nutrient needs related to acute illness as evidenced by estimated needs.  GOAL:  Patient will meet greater than or equal to 90% of their needs  MONITOR:  PO intake, Supplement acceptance, Weight trends, Labs, I & O's  REASON FOR ASSESSMENT:  Malnutrition Screening Tool  ASSESSMENT: Per H&P, 79 year old female with a history of bronchiectasis developed community-acquired pneumonia in May 2016 and presented to the pulmonary office on May 26 with a new right-sided pleural effusion. Thoracentesis was attempted but no fluid could be removed. Admitted 5/27 with CT showing large RLL opacity with loculated moderate right pleural effusion. She underwent right VATS with drainage and decortication of a right lower lobe empyema.. Admitted from office with severe right sided chest wall pain and weakness 6/7.    Pt seen for MST. BMI indicates normal weight status. Pt ate 100% of lunch (visualized tray) but no other intakes documented. Pt states that she was recently at [facility] which is confirmed by above statement from H&P. She indicates that her appetite is fair but that her sister ensures that she eats well at each meal. Pt was not drinking Ensure at home PTA at [facility] but states that her sister also encourages her to drink this by having her take 2 sips every 30-60 minutes.   She indicates that she has lost 5 lbs since her admission in May. Per weight hx review, pt has lost 9 lbs (7% body weight) in 2 months which is significant for time frame. Did not note any muscle or fat wasting during physical exam.  Expect pt to meet needs with meals and supplement with encouragement from family. Medications reviewed. Labs reviewed.  Height:  Ht Readings  from Last 1 Encounters:  01/14/15 5\' 3"  (1.6 m)    Weight:  Wt Readings from Last 1 Encounters:  01/14/15 129 lb (58.514 kg)    Ideal Body Weight:  52.3 kg (kg)  Wt Readings from Last 10 Encounters:  01/14/15 129 lb (58.514 kg)  01/14/15 120 lb (54.432 kg)  01/10/15 125 lb (56.7 kg)  01/02/15 127 lb (57.607 kg)  11/15/14 129 lb (58.514 kg)  09/26/14 132 lb (59.875 kg)  02/04/14 128 lb (58.06 kg)  01/02/14 126 lb (57.153 kg)  07/09/13 123 lb (55.792 kg)    BMI:  Body mass index is 22.86 kg/(m^2).  Estimated Nutritional Needs:  Kcal:  1400-1600  Protein:  60-70 grams  Fluid:  2-2.2 L/day  Skin:  Wound (see comment) (Bilateral chest incisions)  Diet Order:  Diet Heart Room service appropriate?: Yes; Fluid consistency:: Thin  EDUCATION NEEDS:  No education needs identified at this time   Intake/Output Summary (Last 24 hours) at 01/15/15 1318 Last data filed at 01/15/15 1311  Gross per 24 hour  Intake    120 ml  Output      0 ml  Net    120 ml    Last BM:  PTA   Jarome Matin, RD, LDN Inpatient Clinical Dietitian Pager # [phone] After hours/weekend pager # [phone]

## 2015-01-15 NOTE — Progress Notes (Signed)
Name: Tina Bell MRN: [id number] DOB: 1936/05/28    ADMISSION DATE:  01/14/2015    CHIEF COMPLAINT:  Very weak   BRIEF PATIENT DESCRIPTION: 79 year old female with a history of bronchiectasis developed community-acquired pneumonia in May 2016 and presented to the pulmonary office on May 26 with a new right-sided pleural effusion. Thoracentesis was attempted but no fluid could be removed. Admitted 5/27 with CT showing large RLL opacity with loculated moderate right pleural effusion. She underwent right VATS with drainage and decortication of a right lower lobe empyema.. Admitted from office with severe right sided chest wall pain and weakness  6/7 .    SIGNIFICANT EVENTS   01/06/15 VATS right   STUDIES:  01/03/2015 CT chest images personally reviewed, radiology report pending> there is consolidation and it least a moderate sized pleural effusion on the right side some bronchiectasis noted  . Pleura, peel, Right - INFLAMED AND NECROTIC MESOTHELIAL LINED TISSUE WITH ASSOCIATED FIBRINOPURULENT MATERIAL. - THERE IS NO EVIDENCE OF MALIGNANCY. - SEE COMMENT. 2. Soft tissue, biopsy, Broncho-Pleural fistula - FIBRINOUS MATERIAL. - THERE IS NO EVIDENCE OF MALIGNANCY.   SUBJECTIVE:  "I'm terrible", can't walk due to left knee pain, also with chest pain, minimal cough, mucus production  VITAL SIGNS: Temp:  [97.5 F (36.4 C)-98.6 F (37 C)] 98.4 F (36.9 C) (06/08 0526) Pulse Rate:  [89-104] 93 (06/08 0526) Resp:  [18-20] 18 (06/08 0526) BP: (99-125)/(60-68) 125/63 mmHg (06/08 0526) SpO2:  [91 %-94 %] 93 % (06/08 0526) Weight:  [120 lb (54.432 kg)-129 lb (58.514 kg)] 129 lb (58.514 kg) (06/07 1816)  PHYSICAL EXAMINATION: Gen: mildly ill appearing HENT: OP clear, TM's clear, neck supple PULM: CTA B, normal percussion CV: RRR, no mgr, trace edema GI: BS+, soft, nontender Derm: surgical wounds well healed right chest wall, sutures in place MSK: effusion, tender but not warm left  knee Psyche: upset, angry, anxious    Recent Labs Lab 01/14/15 1815 01/15/15 0525  NA 138 141  K 3.5 3.5  CL 97* 101  CO2 30 28  BUN 19 14  CREATININE 0.82 0.79  GLUCOSE 133* 93    Recent Labs Lab 01/11/15 0250 01/14/15 1815 01/15/15 0525  HGB 10.1* 9.7* 9.9*  HCT 31.5* 30.5* 31.3*  WBC 7.3 18.5* 14.5*  PLT 401* 406* 432*   Portable Chest 1 View  01/14/2015   CLINICAL DATA:  Empyema lung.  Right-sided chest pain at incision.  EXAM: PORTABLE CHEST - 1 VIEW  COMPARISON:  01/10/2015  FINDINGS: Persistent small right pleural effusion and right base atelectasis or scarring, stable. No pneumothorax. Left lung is clear. Heart is upper limits normal in size. Diffuse interstitial prominence throughout the lungs, likely chronic interstitial lung disease. No acute bony abnormality.  IMPRESSION: Stable small right pleural effusion with right basilar atelectasis or scarring.  Stable chronic interstitial prominence.   Electronically Signed   By: Rolm Baptise M.D.   On: 01/14/2015 18:18    ASSESSMENT / PLAN: 1.  Right Lower lobe Empyema s/p right VATS with drainage and decortication secondary to CAP  With acute chest wall pain post operatively without sign of worsening effusion or other pathology.  Plan:  Continue IV levaquin overnight, consider stopping tomorrow based on WBC Change pain control to toradol IV x1 then ibuprofen Consider CT chest   2. Bronchiectasis without exacerbation  Plan  F/up cxr  Flutter valve mucinex   3. Chest wall pain   Plan  ekg  Cycle enzymes   4.  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   PT consult Out of bed  Will need SW consult for rehab discharge.    Roselie Awkward, MD [place] [facility] Pager: [phone] Cell: [phone] After 3pm or if no response, call [phone]  01/15/2015, 12:07  PM

## 2015-01-16 DIAGNOSIS — M1180 Other specified crystal arthropathies, unspecified site: Secondary | ICD-10-CM

## 2015-01-16 DIAGNOSIS — R29898 Other symptoms and signs involving the musculoskeletal system: Secondary | ICD-10-CM

## 2015-01-16 LAB — TROPONIN I
Troponin I: <0.03 ng/mL (ref <0.031)
Troponin I: <0.03 ng/mL (ref <0.031)
Troponin I: <0.03 ng/mL (ref <0.031)
Troponin I: <0.03 ng/mL (ref <0.031)

## 2015-01-16 LAB — CBC
HCT: 30.3 % — ABNORMAL LOW (ref 36.0–46.0)
Hemoglobin: 9.5 g/dL — ABNORMAL LOW (ref 12.0–15.0)
MCH: 26.1 pg (ref 26.0–34.0)
MCHC: 31.4 g/dL (ref 30.0–36.0)
MCV: 83.2 fL (ref 78.0–100.0)
Platelets: 435 10*3/uL — ABNORMAL HIGH (ref 150–400)
RBC: 3.64 MIL/uL — ABNORMAL LOW (ref 3.87–5.11)
RDW: 14 % (ref 11.5–15.5)
WBC: 11.3 10*3/uL — ABNORMAL HIGH (ref 4.0–10.5)

## 2015-01-16 LAB — BASIC METABOLIC PANEL
Anion gap: 11 (ref 5–15)
BUN: 12 mg/dL (ref 6–20)
CO2: 26 mmol/L (ref 22–32)
Calcium: 8.6 mg/dL — ABNORMAL LOW (ref 8.9–10.3)
Chloride: 105 mmol/L (ref 101–111)
Creatinine, Ser: 0.69 mg/dL (ref 0.44–1.00)
GFR calc Af Amer: >60 mL/min (ref >60)
GFR calc non Af Amer: >60 mL/min (ref >60)
Glucose, Bld: 103 mg/dL — ABNORMAL HIGH (ref 65–99)
Potassium: 3.8 mmol/L (ref 3.5–5.1)
Sodium: 142 mmol/L (ref 135–145)

## 2015-01-16 MED ORDER — AMOXICILLIN-POT CLAVULANATE 875-125 MG PO TABS: 1.0000 | ORAL_TABLET | Freq: Two times a day (BID) | ORAL | Status: DC

## 2015-01-16 MED ORDER — RISAQUAD PO CAPS
1.0000 | ORAL_CAPSULE | Freq: Every day | ORAL | Status: DC
  Administered 2015-01-16 – 2015-01-17 (×2): 1 via ORAL
  Filled 2015-01-16 (×2): qty 1

## 2015-01-16 MED ORDER — AMOXICILLIN-POT CLAVULANATE 875-125 MG PO TABS
1.0000 | ORAL_TABLET | Freq: Two times a day (BID) | ORAL | Status: DC
  Administered 2015-01-16 – 2015-01-17 (×3): 1 via ORAL
  Filled 2015-01-16 (×4): qty 1

## 2015-01-16 MED ORDER — CELECOXIB 200 MG PO CAPS
200.0000 mg | ORAL_CAPSULE | Freq: Two times a day (BID) | ORAL | Status: DC
  Administered 2015-01-16 – 2015-01-17 (×3): 200 mg via ORAL
  Filled 2015-01-16 (×5): qty 1

## 2015-01-16 NOTE — Progress Notes (Signed)
Thank you for consult on Tina Bell. Note that she was admitted on 06/07 with chest wall pain and left knee pain. She is min guard assist on PT evaluation this am and is limited by knee pain. She does not have rehab diagnosis to justify CIR stay. Would recommend SNF for further therapies.

## 2015-01-16 NOTE — Progress Notes (Signed)
Rehab Admissions Coordinator Note:  Patient was screened by Retta Diones for appropriateness for an Inpatient Acute Rehab Consult.  At this time, an inpatient rehab consult has been ordered.  However, patient has [place] and with current diagnosis, we will not be able to get approval for inpatient rehab.  Recommend pursuit of SNF placement or HH therapies if patient progresses.  Call me for questions.   Jodell Cipro M 01/16/2015, 10:00 AM  I can be reached at [phone].

## 2015-01-16 NOTE — Progress Notes (Signed)
SNF search is underway- full assessment to follow-   Eduard Clos, MSW, [place]

## 2015-01-16 NOTE — Progress Notes (Addendum)
Name: Tina Bell MRN: [id number] DOB: 1936-01-14    ADMISSION DATE:  01/14/2015    CHIEF COMPLAINT:  Very weak   BRIEF PATIENT DESCRIPTION: 79 year old female with a history of bronchiectasis developed community-acquired pneumonia in May 2016 and presented to the pulmonary office on May 26 with a new right-sided pleural effusion. Thoracentesis was attempted but no fluid could be removed. Admitted 5/27 with CT showing large RLL opacity with loculated moderate right pleural effusion. She underwent right VATS with drainage and decortication of a right lower lobe empyema.. Admitted from office with severe right sided chest wall pain and weakness  6/7.   SIGNIFICANT EVENTS   01/06/15 VATS right for empyema  STUDIES:  01/03/2015 CT chest images personally reviewed, radiology report pending> there is consolidation and it least a moderate sized pleural effusion on the right side some bronchiectasis noted  . Pleura, peel, Right - INFLAMED AND NECROTIC MESOTHELIAL LINED TISSUE WITH ASSOCIATED FIBRINOPURULENT MATERIAL. - THERE IS NO EVIDENCE OF MALIGNANCY. - SEE COMMENT  2. Soft tissue, biopsy, Broncho-Pleural fistula - FIBRINOUS MATERIAL. - THERE IS NO EVIDENCE OF MALIGNANCY.   SUBJECTIVE:   Feeling better, has been out of bed  VITAL SIGNS: Temp:  [97.5 F (36.4 C)-98 F (36.7 C)] 97.5 F (36.4 C) (06/09 0514) Pulse Rate:  [86-90] 87 (06/09 0514) Resp:  [18-19] 18 (06/09 0514) BP: (112-118)/(72-73) 114/73 mmHg (06/09 0514) SpO2:  [96 %-98 %] 96 % (06/09 0514)  PHYSICAL EXAMINATION: Gen: mildly ill appearing HENT: OP clear, TM's clear, neck supple PULM: CTA B, normal percussion, no accessory muscle use  CV: RRR, no mgr, trace edema GI: BS+, soft, nontender Derm: surgical wounds well healed right chest wall, sutures in place MSK: effusion, tender but not warm left knee Psyche: upset, angry, anxious    Recent Labs Lab 01/14/15 1815 01/15/15 0525 01/16/15 0525  NA 138  141 142  K 3.5 3.5 3.8  CL 97* 101 105  CO2 30 28 26   BUN 19 14 12   CREATININE 0.82 0.79 0.69  GLUCOSE 133* 93 103*    Recent Labs Lab 01/14/15 1815 01/15/15 0525 01/16/15 0525  HGB 9.7* 9.9* 9.5*  HCT 30.5* 31.3* 30.3*  WBC 18.5* 14.5* 11.3*  PLT 406* 432* 435*    Recent Labs Lab 01/15/15 1735 01/15/15 2327 01/16/15 0525  TROPONINI <0.03 <0.03 <0.03    Portable Chest 1 View  01/14/2015   CLINICAL DATA:  Empyema lung.  Right-sided chest pain at incision.  EXAM: PORTABLE CHEST - 1 VIEW  COMPARISON:  01/10/2015  FINDINGS: Persistent small right pleural effusion and right base atelectasis or scarring, stable. No pneumothorax. Left lung is clear. Heart is upper limits normal in size. Diffuse interstitial prominence throughout the lungs, likely chronic interstitial lung disease. No acute bony abnormality.  IMPRESSION: Stable small right pleural effusion with right basilar atelectasis or scarring.  Stable chronic interstitial prominence.   Electronically Signed   By: Rolm Baptise M.D.   On: 01/14/2015 18:18    ASSESSMENT / PLAN: 1.  Right Lower lobe Empyema s/p right VATS with drainage and decortication secondary to CAP  With acute chest wall pain post operatively without sign of worsening effusion or other pathology. Her WBC ct has improved.  Plan:  Change levaquin to augmentin today, plan 5 more days Add back home celebrex bid  2. Bronchiectasis without exacerbation Plan  F/up cxr am 6/10 Flutter valve mucinex    3. Chest wall pain: CEs were neg; ECG was nml  Plan  Symptomatic rx w/ analgesia   4. Isolated acute left knee effusion with pain, tap now c/w pseudo-gout 6/8 Plan PT consult Out of bed Start celebrex Question for ortho> is kenalog injection indicated? Had steroid injection 12/10/14 Minimize narcotics  5. Mild encephalopathy > likely due to levaquin, acute issue (pseudogout, infection) Minimize sedation  Will need SW consult for rehab discharge.     Roselie Awkward, MD [place] [facility] Pager: [phone] Cell: [phone] After 3pm or if no response, call [phone]    01/16/2015, 8:48 AM

## 2015-01-16 NOTE — Evaluation (Addendum)
Physical Therapy Evaluation Patient Details Name: FEMALE IAFRATE MRN: [id number] DOB: 1936-04-15 Today's Date: 01/16/2015   History of Present Illness  79 year old female with a history of bronchiectasis developed community-acquired pneumonia in May 2016 and presented to the pulmonary office on May 26 with a new right-sided pleural effusion.Admitted 5/27 with CT showing large RLL opacity with loculated moderate right pleural effusion. She underwent right VATS with drainage and decortication of a right lower lobe empyema.. Admitted from office 01/14/15  with severe right sided chest wall pain and weakness 6/7 .L knee pain, aspirated, possible pseudogout.   Clinical Impression  Patient  Presents with generalized weakness, ambulated for first time today. Patient's spouse will be unable to care for patient  At DC. Recommend post acute rehab. Patient will benefit from PT to address problems listed in note below.( PT problem List). Patient's  O2 sats 95%, HR 122 while AMBULATING.    Follow Up Recommendations CIR (SNF if CIR not option)    Equipment Recommendations  None recommended by PT    Recommendations for Other Services       Precautions / Restrictions Precautions Precautions: Fall  Monitor sats     Mobility  Bed Mobility               General bed mobility comments: in recliner  Transfers Overall transfer level: Needs assistance Equipment used: Rolling walker (2 wheeled) Transfers: Sit to/from Stand Sit to Stand: Min guard;Min assist         General transfer comment: cues for hand placement, reach back /push up  Ambulation/Gait Ambulation/Gait assistance: Min guard Ambulation Distance (Feet): 30 Feet (then 110') Assistive device: Rolling walker (2 wheeled) Gait Pattern/deviations: Step-to pattern;Step-through pattern;Decreased stride length Gait velocity: slow   General Gait Details: gaait is very slow, noted tremors of hands on RW,, legs shakey  Stairs             Wheelchair Mobility    Modified Rankin (Stroke Patients Only)       Balance Overall balance assessment: Needs assistance Sitting-balance support: Feet supported;No upper extremity supported Sitting balance-Leahy Scale: Good     Standing balance support: During functional activity;No upper extremity supported Standing balance-Leahy Scale: Fair                               Pertinent Vitals/Pain Pain Assessment: 0-10 Pain Score: 5  Pain Location: neck is stiff, L knee then R chest wall Pain Descriptors / Indicators: Aching;Discomfort Pain Intervention(s): Limited activity within patient's tolerance;Monitored during session (too celebrex prior.)    Home Living Family/patient expects to be discharged to:: Private residence Living Arrangements: Spouse/significant other;Children Available Help at Discharge: Family;Other (Comment);Available PRN/intermittently Type of Home: House Home Access: Stairs to enter Entrance Stairs-Rails: Right Entrance Stairs-Number of Steps: 2 + 4  Home Layout: Two level;Bed/bath upstairs Home Equipment: Walker - 2 wheels Additional Comments: spouse scheduled for prostate surgery and unable to assist patient.    Prior Function Level of Independence: Independent         Comments: until VATS     Hand Dominance        Extremity/Trunk Assessment   Upper Extremity Assessment: Generalized weakness           Lower Extremity Assessment: Generalized weakness      Cervical / Trunk Assessment: Other exceptions  Communication   Communication: No difficulties  Cognition Arousal/Alertness: Awake/alert Behavior During Therapy: WFL for tasks assessed/performed Overall  Cognitive Status: Within Functional Limits for tasks assessed                      General Comments      Exercises        Assessment/Plan    PT Assessment Patient needs continued PT services  PT Diagnosis Difficulty walking;Generalized  weakness;Acute pain   PT Problem List Decreased strength;Decreased activity tolerance;Decreased balance;Decreased mobility;Pain  PT Treatment Interventions DME instruction;Gait training;Stair training;Functional mobility training;Therapeutic activities;Therapeutic exercise;Patient/family education   PT Goals (Current goals can be found in the Care Plan section) Acute Rehab PT Goals Patient Stated Goal: to play tennis again PT Goal Formulation: With patient/family Time For Goal Achievement: 01/30/15 Potential to Achieve Goals: Good    Frequency Min 3X/week   Barriers to discharge Decreased caregiver support;Inaccessible home environment      Co-evaluation               End of Session   Activity Tolerance: Patient tolerated treatment well Patient left: in chair;with call bell/phone within reach;with family/visitor present Nurse Communication: Mobility status         Time: 3838-1840 PT Time Calculation (min) (ACUTE ONLY): 33 min   Charges:   PT Evaluation $Initial PT Evaluation Tier I: 1 Procedure PT Treatments $Gait Training: 8-22 mins   PT G Codes:        Claretha Cooper 01/16/2015, 9:51 AM Tresa Endo PT [phone]

## 2015-01-17 LAB — TROPONIN I: Troponin I: <0.03 ng/mL (ref <0.031)

## 2015-01-17 LAB — GLUCOSE, CAPILLARY: Glucose-Capillary: 87 mg/dL (ref 65–99)

## 2015-01-17 MED ORDER — RISAQUAD PO CAPS: 1.0000 | ORAL_CAPSULE | Freq: Every day | ORAL | Status: DC

## 2015-01-17 MED ORDER — ENSURE ENLIVE PO LIQD: 237.0000 mL | Freq: Two times a day (BID) | ORAL | Status: DC

## 2015-01-17 MED ORDER — HEPARIN SODIUM (PORCINE) 5000 UNIT/ML IJ SOLN: 5000.0000 [IU] | Freq: Three times a day (TID) | INTRAMUSCULAR | Status: DC

## 2015-01-17 MED ORDER — ACETAMINOPHEN 325 MG PO TABS: 650.0000 mg | ORAL_TABLET | Freq: Four times a day (QID) | ORAL | Status: DC | PRN

## 2015-01-17 MED ORDER — ALBUTEROL SULFATE (2.5 MG/3ML) 0.083% IN NEBU: 2.5000 mg | INHALATION_SOLUTION | RESPIRATORY_TRACT | Status: DC | PRN

## 2015-01-17 MED ORDER — AMOXICILLIN-POT CLAVULANATE 875-125 MG PO TABS: 1.0000 | ORAL_TABLET | Freq: Two times a day (BID) | ORAL | Status: AC

## 2015-01-17 MED ORDER — CELECOXIB 200 MG PO CAPS: 200.0000 mg | ORAL_CAPSULE | Freq: Two times a day (BID) | ORAL | Status: DC

## 2015-01-17 MED ORDER — ONDANSETRON HCL 4 MG PO TABS: 4.0000 mg | ORAL_TABLET | Freq: Four times a day (QID) | ORAL | Status: DC | PRN

## 2015-01-17 MED ORDER — SENNOSIDES-DOCUSATE SODIUM 8.6-50 MG PO TABS: 1.0000 | ORAL_TABLET | Freq: Every evening | ORAL | Status: DC | PRN

## 2015-01-17 NOTE — Progress Notes (Signed)
Pt transported down in wheelchair with family to visit her husband who is on another unit after having a procedure. Pt wanted to see him prior to her discharge to [facility]. Pt in NAD, alert and oriented.

## 2015-01-17 NOTE — Clinical Social Work Placement (Signed)
   CLINICAL SOCIAL WORK PLACEMENT  NOTE  Date:  01/17/2015  Patient Details  Name: Tina Bell MRN: [id number] Date of Birth: 1935-10-06  Clinical Social Work is seeking post-discharge placement for this patient at the [place] level of care (*CSW will initial, date and re-position this form in  chart as items are completed):  Yes   Patient/family provided with [facility] Work Department's list of facilities offering this level of care within the geographic area requested by the patient (or if unable, by the patient's family).  Yes   Patient/family informed of their freedom to choose among providers that offer the needed level of care, that participate in Medicare, Medicaid or managed care program needed by the patient, have an available bed and are willing to accept the patient.  Yes   Patient/family informed of [facility]'s ownership interest in [facility] and [facility], as well as of the fact that they are under no obligation to receive care at these facilities.  PASRR submitted to EDS on 01/17/15     PASRR number received on 01/17/15     Existing PASRR number confirmed on       FL2 transmitted to all facilities in geographic area requested by pt/family on 01/16/15     FL2 transmitted to all facilities within larger geographic area on       Patient informed that his/her managed care company has contracts with or will negotiate with certain facilities, including the following:        Yes   Patient/family informed of bed offers received.  Patient chooses bed at  [facility])     Physician recommends and patient chooses bed at      Patient to be transferred to  [facility]) on 01/17/15.  Patient to be transferred to facility by  [facility])     Patient family notified on 01/17/15 of transfer.  Name of family member notified:  patient, son and daughter in law     PHYSICIAN Please prepare priority discharge summary, including  medications     Additional Comment:    _______________________________________________ Ludwig Clarks, LCSW 01/17/2015, 10:28 AM

## 2015-01-17 NOTE — Discharge Summary (Signed)
Physician Discharge Summary       Patient ID: Tina Bell MRN: [id number] DOB/AGE: 09/05/1935 79 y.o.  Admit date: 01/14/2015 Discharge date: 01/17/2015  Discharge Diagnoses:   Right Lower lobe Empyema s/p right VATS with drainage and decortication secondary to CAP  acute chest wall pain post operatively without sign of worsening effusion  Bronchiectasis without exacerbation Isolated acute left knee effusion with pain, tap now c/w pseudo-gout 6/8 Mild encephalopathy    History of current stay    79 year old female with a history of bronchiectasis developed community-acquired pneumonia in May 2016 and presented to the pulmonary office on May 26 with a new right-sided pleural effusion. Thoracentesis was attempted but no fluid could be removed. Admitted 5/27 with CT showing large RLL opacity with loculated moderate right pleural effusion. She underwent right VATS with drainage and decortication of a right lower lobe empyema.. Admitted from office with severe right sided chest wall pain and weakness 6/7. She was admitted to the medical ward. Treatment included: empiric antibiotics, cultures were drawn and IV hydration administered. On further questioning her major complaint was really her left knee. On further evaluation it was warm and edematous. We had ortho see her and she underwent a arthrocentesis at the bedside. She had significant improvement after draining the fluid. Findings were consistent w/ pseudogout. We began NSAID therapy w/ Celebrex. She made gradual improvement in strength and mobility. At the time of d/c she has improved on all counts but remains weak. Family was concerned that with her husband soon to undergo prostate surgery that she would not be able to care for herself well enough at home given her residual weakness. Because of this she will be discharged to SNF for further rehab efforts.    Discharge Plan by active problems   Right Lower lobe Empyema s/p right VATS  with drainage and decortication secondary to CAP  With acute chest wall pain post operatively without sign of worsening effusion or other pathology. Her WBC ct has improved.  Plan:  Change levaquin to augmentin today, plan 4 more days Add back home celebrex bid  Bronchiectasis without exacerbation Plan  Flutter valve mucinex   Chest wall pain: CEs were neg; ECG was nml Plan  Symptomatic rx w/ analgesia   Isolated acute left knee effusion with pain, tap now c/w pseudo-gout 6/8 Plan PT consult Out of bed cont celebrex Minimize narcotics  Mild encephalopathy > likely due to levaquin, acute issue (pseudogout, infection) Minimize sedation   Significant Hospital tests/ studies  Consults : Tina Bell Ortho Discharge Exam: BP 118/55 mmHg  Pulse 68  Temp(Src) 97.9 F (36.6 C) (Oral)  Resp 18  Ht 5\' 3"  (1.6 m)  Wt 58.514 kg (129 lb)  BMI 22.86 kg/m2  SpO2 96% Room air   Gen: no distress HENT: OP clear, TM's clear, neck supple PULM: CTA B, normal percussion, no accessory muscle use  CV: RRR, no mgr, trace edema GI: BS+, soft, nontender Derm: surgical wounds well healed right chest wall, sutures in place MSK: effusion, tender but not warm left knee Psyche: upset, angry, anxious  Labs at discharge Lab Results  Component Value Date   CREATININE 0.69 01/16/2015   BUN 12 01/16/2015   NA 142 01/16/2015   K 3.8 01/16/2015   CL 105 01/16/2015   CO2 26 01/16/2015   Lab Results  Component Value Date   WBC 11.3* 01/16/2015   HGB 9.5* 01/16/2015   HCT 30.3* 01/16/2015   MCV 83.2 01/16/2015  PLT 435* 01/16/2015   Lab Results  Component Value Date   ALT 16 01/14/2015   AST 24 01/14/2015   ALKPHOS 110 01/14/2015   BILITOT 0.5 01/14/2015   Lab Results  Component Value Date   INR 1.16 01/05/2015    Current radiology studies No results found.  Disposition:  06-Home-Health Care Svc      Discharge Instructions    Diet - low sodium heart healthy     Complete by:  As directed      Increase activity slowly    Complete by:  As directed             Medication List    STOP taking these medications        cyclobenzaprine 5 MG tablet  Commonly known as:  FLEXERIL     levofloxacin 500 MG tablet  Commonly known as:  LEVAQUIN     traMADol 50 MG tablet  Commonly known as:  ULTRAM      TAKE these medications        acetaminophen 325 MG tablet  Commonly known as:  TYLENOL  Take 2 tablets (650 mg total) by mouth every 6 (six) hours as needed for mild pain (or Fever >/= 101).     acidophilus Caps capsule  Take 1 capsule by mouth daily.     albuterol (2.5 MG/3ML) 0.083% nebulizer solution  Commonly known as:  PROVENTIL  Take 3 mLs (2.5 mg total) by nebulization every 2 (two) hours as needed for wheezing.     amoxicillin-clavulanate 875-125 MG per tablet  Commonly known as:  AUGMENTIN  Take 1 tablet by mouth every 12 (twelve) hours.     calcium-vitamin D 500-200 MG-UNIT per tablet  Commonly known as:  OSCAL WITH D  Take 1 tablet by mouth daily with breakfast.     celecoxib 200 MG capsule  Commonly known as:  CELEBREX  Take 1 capsule (200 mg total) by mouth 2 (two) times daily.     feeding supplement (ENSURE ENLIVE) Liqd  Take 237 mLs by mouth 2 (two) times daily between meals.     FLUTTER Devi  Use 2-4 times daily     heparin 5000 UNIT/ML injection  Inject 1 mL (5,000 Units total) into the skin every 8 (eight) hours.     Krill Oil 1000 MG Caps  Take 1 capsule by mouth 2 (two) times daily.     ondansetron 4 MG tablet  Commonly known as:  ZOFRAN  Take 1 tablet (4 mg total) by mouth every 6 (six) hours as needed for nausea.     senna-docusate 8.6-50 MG per tablet  Commonly known as:  Senokot-S  Take 1 tablet by mouth at bedtime as needed for mild constipation.     vitamin C 500 MG tablet  Commonly known as:  ASCORBIC ACID  Take 500 mg by mouth daily.     WOMENS MULTI VITAMIN & MINERAL PO  Take by mouth daily.           Discharged Condition: good  Physician Statement:   The Patient was personally examined, the discharge assessment and plan has been personally reviewed and I agree with ACNP Tina Bell's assessment and plan. > 30 minutes of time have been dedicated to discharge assessment, planning and discharge instructions.   Signed: Burnette Valenti,PETE 01/17/2015, 11:13 AM

## 2015-01-17 NOTE — Clinical Social Work Note (Signed)
Clinical Social Work Assessment  Patient Details  Name: Tina Bell MRN: [id number] Date of Birth: 09/10/1935  Date of referral:  01/16/15               Reason for consult:  Facility Placement                Permission sought to share information with:  Family Supports Permission granted to share information::  Yes, Verbal Permission Granted  Name::      (husband, son and daughter in [place])  Agency::   (SNF's)  Relationship::     Contact Information:     Housing/Transportation Living arrangements for the past 2 months:  Single Family Home Source of Information:  Patient Patient Interpreter Needed:  None Criminal Activity/Legal Involvement Pertinent to Current Situation/Hospitalization:    Significant Relationships:  Adult Children, Spouse, Community Support Lives with:    Do you feel safe going back to the place where you live?  No Need for family participation in patient care:  Yes (Comment)  Care giving concerns:  PT recommends SNF    Social Worker assessment / plan:  CSW met with patient and her family at bedside- son and daughter in law here visiting from [place] is very active- plays golf, tennis and is active in the community. Her husband will be having a prostate surgery tomorrow and thus they are concerned for her needs at dc. Discussed SNF placement with them and they feel this will be the best option for her.   Employment status:  Retired Nurse, adult PT Recommendations:  [place] / Referral to community resources:  [place]  Patient/Family's Response to care:  Family is very focused on the need for her safety and the benefit of going to SNF for some rehab- she is   Patient/Family's Understanding of and Emotional Response to Diagnosis, Current Treatment, and Prognosis:  Family has a good understnading of her medical condition and needs going forward- they are hopeful a SNF rehab stay will  assist with her recovery and ultimate progress/safety going forward.   Emotional Assessment Appearance:  Appears younger than stated age Attitude/Demeanor/Rapport:    Affect (typically observed):    Orientation:  Oriented to Self, Oriented to Place, Oriented to  Time, Oriented to Situation Alcohol / Substance use:  Not Applicable Psych involvement (Current and /or in the community):  No (Comment)  Discharge Needs  Concerns to be addressed:  Discharge Planning Concerns Readmission within the last 30 days:  Yes Current discharge risk:  Physical Impairment Barriers to Discharge:  No Barriers Identified   Ludwig Clarks, LCSW 01/17/2015, 10:15 AM

## 2015-01-17 NOTE — Progress Notes (Signed)
Patient for d/c today to SNF bed at [facility]. Family and patient agreeable to this plan- patient plans to visit her husband on the 4th floor prior to dc and will be transferred  via EMS. Eduard Clos, MSW, Latanya Presser [phone]

## 2015-01-19 LAB — BODY FLUID CULTURE
Culture: NO GROWTH
Special Requests: NORMAL

## 2015-01-20 ENCOUNTER — Non-Acute Institutional Stay (SKILLED_NURSING_FACILITY): Payer: Medicare Other | Admitting: Adult Health

## 2015-01-20 ENCOUNTER — Encounter: Payer: Self-pay | Admitting: Adult Health

## 2015-01-20 DIAGNOSIS — J189 Pneumonia, unspecified organism: Secondary | ICD-10-CM | POA: Diagnosis not present

## 2015-01-20 DIAGNOSIS — R5381 Other malaise: Secondary | ICD-10-CM

## 2015-01-20 DIAGNOSIS — J869 Pyothorax without fistula: Secondary | ICD-10-CM

## 2015-01-20 DIAGNOSIS — M25462 Effusion, left knee: Secondary | ICD-10-CM

## 2015-01-20 DIAGNOSIS — D649 Anemia, unspecified: Secondary | ICD-10-CM | POA: Diagnosis not present

## 2015-01-20 DIAGNOSIS — D72829 Elevated white blood cell count, unspecified: Secondary | ICD-10-CM | POA: Diagnosis not present

## 2015-01-20 DIAGNOSIS — E43 Unspecified severe protein-calorie malnutrition: Secondary | ICD-10-CM | POA: Diagnosis not present

## 2015-01-20 DIAGNOSIS — K5900 Constipation, unspecified: Secondary | ICD-10-CM

## 2015-01-20 LAB — ANAEROBIC CULTURE: Special Requests: NORMAL

## 2015-01-21 ENCOUNTER — Non-Acute Institutional Stay (SKILLED_NURSING_FACILITY): Payer: Medicare Other | Admitting: Internal Medicine

## 2015-01-21 DIAGNOSIS — D72829 Elevated white blood cell count, unspecified: Secondary | ICD-10-CM | POA: Diagnosis not present

## 2015-01-21 DIAGNOSIS — E46 Unspecified protein-calorie malnutrition: Secondary | ICD-10-CM | POA: Diagnosis not present

## 2015-01-21 DIAGNOSIS — D62 Acute posthemorrhagic anemia: Secondary | ICD-10-CM | POA: Diagnosis not present

## 2015-01-21 DIAGNOSIS — R5381 Other malaise: Secondary | ICD-10-CM | POA: Diagnosis not present

## 2015-01-21 DIAGNOSIS — J189 Pneumonia, unspecified organism: Secondary | ICD-10-CM

## 2015-01-21 DIAGNOSIS — M1180 Other specified crystal arthropathies, unspecified site: Secondary | ICD-10-CM

## 2015-01-21 DIAGNOSIS — J869 Pyothorax without fistula: Secondary | ICD-10-CM | POA: Diagnosis not present

## 2015-01-21 DIAGNOSIS — M1120 Other chondrocalcinosis, unspecified site: Secondary | ICD-10-CM

## 2015-01-21 NOTE — Progress Notes (Signed)
Patient ID: Tina Bell, female   DOB: 1935/10/26, 79 y.o.   MRN: [id number]     [place]  PCP: Horatio Pel, MD  Code Status: Full Code   Allergies  Allergen Reactions  . Keflex [Cephalexin] Hives    Tolerated Augmentin  . Morphine And Related Hives    Chief Complaint  Patient presents with  . New Admit To SNF    New Admission      HPI:  79 year old patient is here for short term rehabilitation post hospital admission initially with community acquired pneumonia. She had right lower lobe loculated moderate sized effusion and underwent VATS with drainage and decortication. She was placed on antibiotics. She was discharged home and had left knee pain with effusion requiring readmission. She underwent arthrocentesis and was diagnosed with pseudogout. She has PMH of broncheactasis. She is seen in her room today. She is in no distress. Her breathing has improved. She feels stronger. Her knee pain is minimal. Denies any concerns.  Review of Systems:  Constitutional: Negative for fever, chills, diaphoresis.  HENT: Negative for headache, congestion, nasal discharge Eyes: Negative for eye pain, blurred vision, double vision and discharge.  Respiratory: Negative for cough, shortness of breath and wheezing.   Cardiovascular: Negative for chest pain, palpitations, leg swelling.  Gastrointestinal: Negative for heartburn, nausea, vomiting, abdominal pain. Regular bowel movement appetite is good Genitourinary: Negative for dysuria Musculoskeletal: Negative for back pain, falls Skin: Negative for itching, rash.  Neurological: Negative for dizziness, tingling, focal weakness Psychiatric/Behavioral: Negative for depression   Past Medical History  Diagnosis Date  . Pneumonia 1997  . History of kidney stones 0254,2706  . Arthritis     knees  . Complication of anesthesia     difficulty waking up from anesthesia  . Hx of seasonal allergies   . Pleural  effusion 12/2014   Past Surgical History  Procedure Laterality Date  . Bladder suspension  1998  . Knee arthroscopy Right 1999  . Nose surgery  1989    repair of deviated septum  . Dilation and curettage of uterus      3 procedures in the 1980's for spotting  . Video assisted thoracoscopy (vats)/empyema Right 01/06/2015    Procedure: VIDEO ASSISTED THORACOSCOPY (VATS)/EMPYEMA AND DRAINAGE OF EMPYEMA;  Surgeon: Ivin Poot, MD;  Location: [facility];  Service: Thoracic;  Laterality: Right;   Social History:   reports that she quit smoking about 52 years ago. Her smoking use included Cigarettes. She has a 1 pack-year smoking history. She has never used smokeless tobacco. She reports that she drinks alcohol. She reports that she does not use illicit drugs.  Family History  Problem Relation Age of Onset  . Cancer Mother     ovarian  . Cancer Sister     breast    Medications: Patient's Medications  New Prescriptions   No medications on file  Previous Medications   ACETAMINOPHEN (TYLENOL) 325 MG TABLET    Take 2 tablets (650 mg total) by mouth every 6 (six) hours as needed for mild pain (or Fever >/= 101).   ACIDOPHILUS (RISAQUAD) CAPS CAPSULE    Take 1 capsule by mouth daily.   ALBUTEROL (PROVENTIL) (2.5 MG/3ML) 0.083% NEBULIZER SOLUTION    Take 3 mLs (2.5 mg total) by nebulization every 2 (two) hours as needed for wheezing.   AMBULATORY NON FORMULARY MEDICATION    Medication Name: Med Pass 120 mL by mouth twice daily   AMOXICILLIN-CLAVULANATE (  AUGMENTIN) 875-125 MG PER TABLET    Take 1 tablet by mouth every 12 (twelve) hours.   CALCIUM-VITAMIN D (OSCAL WITH D) 500-200 MG-UNIT PER TABLET    Take 1 tablet by mouth daily with breakfast.    CELECOXIB (CELEBREX) 200 MG CAPSULE    Take 1 capsule (200 mg total) by mouth 2 (two) times daily.   HEPARIN 5000 UNIT/ML INJECTION    Inject 1 mL (5,000 Units total) into the skin every 8 (eight) hours.   KRILL OIL 1000 MG CAPS    Take 1 capsule by  mouth 2 (two) times daily.    MULTIPLE VITAMINS-MINERALS (WOMENS MULTI VITAMIN & MINERAL PO)    Take by mouth daily.   ONDANSETRON (ZOFRAN) 4 MG TABLET    Take 1 tablet (4 mg total) by mouth every 6 (six) hours as needed for nausea.   RESPIRATORY THERAPY SUPPLIES (FLUTTER) DEVI    Use 2-4 times daily   SENNA-DOCUSATE (SENOKOT-S) 8.6-50 MG PER TABLET    Take 1 tablet by mouth at bedtime as needed for mild constipation.   VITAMIN C (ASCORBIC ACID) 500 MG TABLET    Take 500 mg by mouth daily.  Modified Medications   No medications on file  Discontinued Medications   FEEDING SUPPLEMENT, ENSURE ENLIVE, (ENSURE ENLIVE) LIQD    Take 237 mLs by mouth 2 (two) times daily between meals.     Physical Exam: Filed Vitals:   01/21/15 0836  BP: 130/76  Pulse: 79  Temp: 96.2 F (35.7 C)  TempSrc: Oral  Resp: 15  Height: 5\' 3"  (1.6 m)  Weight: 129 lb (58.514 kg)  SpO2: 96%    General- elderly female, well built, in no acute distress Head- normocephalic, atraumatic Throat- moist mucus membrane Eyes- PERRLA, EOMI, no pallor, no icterus, no discharge, normal conjunctiva, normal sclera Neck- no cervical lymphadenopathy, no thyromegaly, no jugular vein distension Cardiovascular- normal s1,s2, no murmurs, palpable dorsalis pedis and radial pulses, no leg edema Respiratory- poor air entry to right side, good air entry to left side, no wheeze, no rhonchi, no crackles, no use of accessory muscles Abdomen- bowel sounds present, soft, non tender Musculoskeletal- able to move all 4 extremities, generalized weakness Neurological- no focal deficit Skin- warm and dry, right posterior chest wall has a surgical incision with glue on it, healing well. 2 small incision with sutures on it noted.  Psychiatry- alert and oriented to person, place and time, normal mood and affect    Labs reviewed: Basic Metabolic Panel:  Recent Labs  01/14/15 1815 01/15/15 0525 01/16/15 0525  NA 138 141 142  K 3.5 3.5 3.8    CL 97* 101 105  CO2 30 28 26   GLUCOSE 133* 93 103*  BUN 19 14 12   CREATININE 0.82 0.79 0.69  CALCIUM 8.7* 9.0 8.6*   Liver Function Tests:  Recent Labs  01/05/15 2106 01/08/15 0430 01/14/15 1815  AST 35 42* 24  ALT 30 28 16   ALKPHOS 144* 113 110  BILITOT 0.3 0.4 0.5  PROT 7.0 5.6* 7.1  ALBUMIN 2.5* 2.0* 2.7*   No results for input(s): LIPASE, AMYLASE in the last 8760 hours. No results for input(s): AMMONIA in the last 8760 hours. CBC:  Recent Labs  01/03/15 1956  01/14/15 1815 01/15/15 0525 01/16/15 0525  WBC 8.5  < > 18.5* 14.5* 11.3*  NEUTROABS 6.1  --  15.7*  --   --   HGB 11.4*  < > 9.7* 9.9* 9.5*  HCT 35.1*  < >  30.5* 31.3* 30.3*  MCV 85.0  < > 84.7 85.5 83.2  PLT 443*  < > 406* 432* 435*  < > = values in this interval not displayed. Cardiac Enzymes:  Recent Labs  01/16/15 1159 01/16/15 1715 01/17/15 0007  TROPONINI <0.03 <0.03 <0.03   BNP: Invalid input(s): POCBNP CBG:  Recent Labs  01/15/15 0719 01/17/15 0806  GLUCAP 103* 87     Assessment/Plan  Physical deconditioning Will have patient work with PT/OT as tolerated to regain strength and restore function/ balance.  Fall precautions are in place.  CAP Clinically stable, afebrile, minimal cough. S/p thoracocentesis and VATS for empyema. Will complete her augmentin course today. Monitor clinically. Encouraged to use her incentive spirometer  Pleural effusion S/p VATS and decortication. Has f/u with her thoracic surgery  Pseudogout In left knee with effusion requiring drainage. Continue celebrex 200 mg bid for pain  Protein calorie malnutrition Monitor weight and po intake. appetite is good. Continue medpass 120 cc bid. RD consult for addition of protein supplement pending  Leukocytosis Improving by time of discharge. Likely in setting of infection. Remains afebrile. Monitor wbc curve  Anemia Likely post procedure with blood loss. Monitor h&h   Goals of care: short term  rehabilitation   Labs/tests ordered: cbc, bmp  Family/ staff Communication: reviewed care plan with patient, her sister and nursing supervisor    Blanchie Serve, MD  [place] [phone] (Monday-Friday 8 am - 5 pm) [phone] (afterhours)

## 2015-01-22 ENCOUNTER — Non-Acute Institutional Stay (SKILLED_NURSING_FACILITY): Payer: Medicare Other | Admitting: Adult Health

## 2015-01-22 ENCOUNTER — Encounter: Payer: Self-pay | Admitting: Adult Health

## 2015-01-22 DIAGNOSIS — D62 Acute posthemorrhagic anemia: Secondary | ICD-10-CM | POA: Diagnosis not present

## 2015-01-22 DIAGNOSIS — R5381 Other malaise: Secondary | ICD-10-CM

## 2015-01-22 DIAGNOSIS — D72829 Elevated white blood cell count, unspecified: Secondary | ICD-10-CM | POA: Diagnosis not present

## 2015-01-22 DIAGNOSIS — K5900 Constipation, unspecified: Secondary | ICD-10-CM | POA: Diagnosis not present

## 2015-01-22 DIAGNOSIS — J189 Pneumonia, unspecified organism: Secondary | ICD-10-CM

## 2015-01-22 DIAGNOSIS — M25462 Effusion, left knee: Secondary | ICD-10-CM | POA: Diagnosis not present

## 2015-01-22 DIAGNOSIS — E43 Unspecified severe protein-calorie malnutrition: Secondary | ICD-10-CM | POA: Diagnosis not present

## 2015-01-22 DIAGNOSIS — J869 Pyothorax without fistula: Secondary | ICD-10-CM

## 2015-01-22 NOTE — Progress Notes (Signed)
Patient ID: Tina Bell, female   DOB: September 30, 1935, 79 y.o.   MRN: [id number]   01/20/15  Facility:  [facility] Home Location:  [place] Room Number: 705-P LEVEL OF CARE:  SNF (31)   Chief Complaint  Patient presents with  . Hospitalization Follow-up    Physical deconditioning, right lower lobe empyema S/P VATS, CAP, left knee effusion, constipation, anemia, leukocytosis and protein calorie malnutrition    HISTORY OF PRESENT ILLNESS:  This is a 79 year old female who was been admitted to [facility] on 01/17/15 from [facility]. She has PMH of bronchiectasis and developed CAP in May 2016. She presented to the pulmonary clinic in May 26 with a new right-sided pleural effusion. Thoracentesis was attempted but no fluid could be removed. She was admitted to the hospital on 5/27 and CT showed right lower lobe opacity with loculated moderate right pleural effusion. She underwent right VATS with drainage and decortication of a right lower lobe empyema. She was discharged on 01/09/15 with home health. She was readmitted to the hospital on 6/07 from clinic follow-up with severe right-sided chest wall pain and weakness. She was complaining of left knee pain. She had arthrocentesis on 6/08. Celebrex was started. She gradually improved but remains weak.   She has been admitted for a short-term rehabilitation.  PAST MEDICAL HISTORY:  Past Medical History  Diagnosis Date  . Pneumonia 1997  . History of kidney stones [id number]  . Arthritis     knees  . Complication of anesthesia     difficulty waking up from anesthesia  . Hx of seasonal allergies   . Pleural effusion 12/2014    CURRENT MEDICATIONS: Reviewed per MAR/see medication list  Allergies  Allergen Reactions  . Keflex [Cephalexin] Hives    Tolerated Augmentin  . Morphine And Related Hives     REVIEW OF SYSTEMS:  GENERAL: no change in appetite, no fatigue, no weight changes, no fever, chills  or weakness RESPIRATORY: no cough, SOB, DOE, wheezing, hemoptysis CARDIAC: no chest pain, edema or palpitations GI: no abdominal pain, diarrhea, constipation, heart burn, nausea or vomiting  PHYSICAL EXAMINATION  GENERAL: no acute distress, normal body habitus EYES: conjunctivae normal, sclerae normal, normal eye lids NECK: supple, trachea midline, no neck masses, no thyroid tenderness, no thyromegaly LYMPHATICS: no LAN in the neck, no supraclavicular LAN RESPIRATORY: breathing is even & unlabored, BS CTAB CARDIAC: RRR, no murmur,no extra heart sounds, no edema GI: abdomen soft, normal BS, no masses, no tenderness, no hepatomegaly, no splenomegaly EXTREMITIES:  Able to move 4 extremities PSYCHIATRIC: the patient is alert & oriented to person, affect & behavior appropriate  LABS/RADIOLOGY: Labs reviewed: Basic Metabolic Panel:  Recent Labs  01/14/15 1815 01/15/15 0525 01/16/15 0525  NA 138 141 142  K 3.5 3.5 3.8  CL 97* 101 105  CO2 30 28 26   GLUCOSE 133* 93 103*  BUN 19 14 12   CREATININE 0.82 0.79 0.69  CALCIUM 8.7* 9.0 8.6*   Liver Function Tests:  Recent Labs  01/05/15 2106 01/08/15 0430 01/14/15 1815  AST 35 42* 24  ALT 30 28 16   ALKPHOS 144* 113 110  BILITOT 0.3 0.4 0.5  PROT 7.0 5.6* 7.1  ALBUMIN 2.5* 2.0* 2.7*   CBC:  Recent Labs  01/03/15 1956  01/14/15 1815 01/15/15 0525 01/16/15 0525  WBC 8.5  < > 18.5* 14.5* 11.3*  NEUTROABS 6.1  --  15.7*  --   --   HGB 11.4*  < >  9.7* 9.9* 9.5*  HCT 35.1*  < > 30.5* 31.3* 30.3*  MCV 85.0  < > 84.7 85.5 83.2  PLT 443*  < > 406* 432* 435*  < > = values in this interval not displayed.  Cardiac Enzymes:  Recent Labs  01/16/15 1159 01/16/15 1715 01/17/15 0007  TROPONINI <0.03 <0.03 <0.03   CBG:  Recent Labs  01/15/15 0719 01/17/15 0806  GLUCAP 103* 87    Dg Chest 1 View  01/03/2015   CLINICAL DATA:  Status post right-sided thoracentesis.  EXAM: CHEST  1 VIEW  COMPARISON:  01/01/2015 and  current chest CT.  FINDINGS: Opacity at the right lung base is similar to the prior chest radiograph. Although a component of this may reflect residual fluid. Patchy of this is likely consolidated lung.  There is no pneumothorax.  No left pleural effusion.  IMPRESSION: 1. No pneumothorax following right-sided thoracentesis. Lung base opacity on the right is without significant change when compared to the prior chest radiograph.   Electronically Signed   By: Lajean Manes M.D.   On: 01/03/2015 15:32   Dg Chest 2 View  01/10/2015   CLINICAL DATA:  Subsequent encounter for pneumothorax. Status post chest tube removal.  EXAM: CHEST  2 VIEW  COMPARISON:  01/09/2015.  FINDINGS: Right chest tube seen previously is been removed in the interval. No evidence for residual right-sided pneumothorax. There is persistent small right pleural effusion with right base atelectasis. Left lung is clear. The cardio pericardial silhouette is enlarged. Telemetry leads overlie the chest.  IMPRESSION: Interval removal of right chest tube without evidence for pneumothorax.  Persistent right small pleural effusion/pleural thickening with basilar atelectasis.   Electronically Signed   By: Misty Stanley M.D.   On: 01/10/2015 07:12   Dg Chest 2 View  01/06/2015   CLINICAL DATA:  Effusion and shortness of Breath  EXAM: CHEST  2 VIEW  COMPARISON:  01/05/2015  FINDINGS: The heart size appears normal. No pleural effusion or edema. No airspace consolidation identified. There is a loculated right pleural effusion containing gas identified which appears unchanged in volume from previous exam. Overlying compressive type atelectasis noted.  IMPRESSION: 1. No change in loculated, gas containing right pleural effusion with overlying consolidation.   Electronically Signed   By: Kerby Moors M.D.   On: 01/06/2015 08:06   Dg Chest 2 View  01/05/2015   CLINICAL DATA:  Followup right pleural effusion.  Subsequent exam.  EXAM: CHEST  2 VIEW  COMPARISON:   01/04/2015.  FINDINGS: Right pleural effusion appears mildly decreased in size from the lateral view. There is a minimal left pleural effusion. Associated right lower lobe opacity and mild posterior right middle lobe opacity are stable. There is a interstitial thickening and mild tree-in-bud type opacities noted in the right upper lobe as well as a right middle and lower lobes.  No new areas of lung opacity.  Mild scarring noted at the apices.  Cardiac silhouette is normal size.  No mediastinal hilar masses.  IMPRESSION: 1. Mild decrease in the right pleural effusions the previous day's study. 2. No other change. Persistent right lower lobe right middle lobe consolidation or are persistent headaches tree-in-bud type right lung opacities. No new lung abnormalities.   Electronically Signed   By: Lajean Manes M.D.   On: 01/05/2015 09:19   Dg Chest 2 View  01/04/2015   CLINICAL DATA:  Pneumonia, pleural effusion  EXAM: CHEST  2 VIEW  COMPARISON:  01/03/2015  FINDINGS:  Aeration is improved with persistent dense right lower lobe consolidation and moderate right pleural effusion. Trace left pleural fluid. Heart size upper limits of normal. The aorta is unfolded and ectatic.  IMPRESSION: Improved overall aeration with stable right lower lobe consolidation compatible with pneumonia and moderate right pleural effusion.   Electronically Signed   By: Conchita Paris M.D.   On: 01/04/2015 09:07   Ct Chest Wo Contrast  01/03/2015   CLINICAL DATA:  Patient with history of pneumonia status post thoracentesis this morning. Only a small amount of fluid was able to be aspirated.  EXAM: CT CHEST WITHOUT CONTRAST  TECHNIQUE: Multidetector CT imaging of the chest was performed following the standard protocol without IV contrast.  COMPARISON:  Chest radiograph 01/01/2015; 12/17/2014  FINDINGS: Mediastinum/Nodes: Visualized thyroid is unremarkable. No enlarged axillary, mediastinal or hilar lymphadenopathy. Heart is normal in size.  The ascending thoracic aorta is dilated measuring 4 cm. Main pulmonary artery is unremarkable.  Lungs/Pleura: Central airways are patent. Dense consolidative opacification within the right lower lobe. Moderate loculated right pleural effusion. There are multiple scattered tree-in-bud nodular opacities demonstrated particularly within the right upper and right middle lobes with a few scattered nodules within the left lower lobe. Mild atelectasis within the lingula. No pneumothorax.  Upper abdomen: The adrenal glands are unremarkable. The noncontrast enhanced appearance of the visualized liver is unremarkable.  Musculoskeletal: No aggressive or acute appearing osseous lesions.  IMPRESSION: Large consolidative pulmonary opacity within the right lower lobe. There is an associated loculated moderate right pleural effusion within the lower right hemithorax. Overall findings are concerning for pneumonia and parapneumonic effusion.  Scattered tree-in-bud nodular opacities within the right upper and middle lobes as well as left lower lobe. Findings are likely infectious or inflammatory in etiology.  Recommend continued radiographic followup to ensure resolution.  Ascending thoracic aorta measures 4 cm. Recommend semi-annual imaging followup by CTA or MRA and referral to cardiothoracic surgery if not already obtained. This recommendation follows 2010 ACCF/AHA/AATS/ACR/ASA/SCA/SCAI/SIR/STS/SVM Guidelines for the Diagnosis and Management of Patients With Thoracic Aortic Disease. Circulation. 2010; 121: K349-Z79   Electronically Signed   By: Lovey Newcomer M.D.   On: 01/03/2015 13:58   Portable Chest 1 View  01/14/2015   CLINICAL DATA:  Empyema lung.  Right-sided chest pain at incision.  EXAM: PORTABLE CHEST - 1 VIEW  COMPARISON:  01/10/2015  FINDINGS: Persistent small right pleural effusion and right base atelectasis or scarring, stable. No pneumothorax. Left lung is clear. Heart is upper limits normal in size. Diffuse  interstitial prominence throughout the lungs, likely chronic interstitial lung disease. No acute bony abnormality.  IMPRESSION: Stable small right pleural effusion with right basilar atelectasis or scarring.  Stable chronic interstitial prominence.   Electronically Signed   By: Rolm Baptise M.D.   On: 01/14/2015 18:18   Dg Chest Port 1 View  01/09/2015   CLINICAL DATA:  In right-sided empyema inlet with stress chest tube treatment, history of bronchiectasis and community-acquired pneumonia  EXAM: PORTABLE CHEST - 1 VIEW  COMPARISON:  Portable chest x-ray of January 08, 2015  FINDINGS: The left lung is well-expanded and clear. On the right there is improved aeration but mild persistent volume loss. There is pleural fluid -pleural thickening at the right lung base which is stable. There is associated atelectasis or infiltrate. The upper chest tube has been removed. The right lower chest tube is stable with the tip overlying the medial aspect of the eleventh rib. The cardiac silhouette is mildly enlarged.  The pulmonary vascularity is normal. The right internal jugular venous catheter tip projects over the midportion of the SVC.  IMPRESSION: Improved aeration of the right lung. No pneumothorax or significant increase in pleural fluid since removal of the upper chest tube. Persistent right basilar pleural fluid and/or residual empyema with associated atelectasis.   Electronically Signed   By: David  Martinique M.D.   On: 01/09/2015 07:37   Dg Chest Port 1 View  01/08/2015   CLINICAL DATA:  Chest tube in place. Status post thoracotomy and empyema drainage.  EXAM: PORTABLE CHEST - 1 VIEW  COMPARISON:  01/07/2015  FINDINGS: Right jugular central venous catheter projects over the mid SVC, unchanged. 2 right-sided chest tubes remain in place. Thoracic aortic calcification is noted. Cardiac silhouette appears mildly enlarged. There is persistent blunting of the costophrenic angles compatible with small pleural effusions, right  larger than left. Patchy right basilar parenchymal lung opacity has mildly increased. Patchy retrocardiac opacity in the left lower lobe is stable to slightly improved. No pneumothorax is identified.  IMPRESSION: 1. Mildly increased right basilar opacity may represent increased atelectasis. Infection is not excluded. 2. Unchanged left basilar subsegmental atelectasis. 3. Small bilateral pleural effusions.   Electronically Signed   By: Logan Bores   On: 01/08/2015 08:05   Dg Chest Port 1 View  01/07/2015   CLINICAL DATA:  Status post thoracostomy an empyema drainage on the right  EXAM: PORTABLE CHEST - 1 VIEW  COMPARISON:  Portable chest x-ray of Jan 06, 2015  FINDINGS: The lungs are adequately inflated. Two right-sided chest tubes persists. There is no pneumothorax. A small amount of pleural fluid and right basilar atelectasis are noted. There is minimal retrocardiac density on the left slightly more conspicuous today. The cardiac silhouette is mildly enlarged. The pulmonary vascularity is normal. The right internal jugular venous catheter tip projects over the midportion of the SVC. The bony thorax exhibits no acute abnormality.  IMPRESSION: Fairly stable appearance of the right hemi thorax with persistent small basilar pleural effusion. There is no pneumothorax. Increased subsegmental atelectasis at the left lung base.   Electronically Signed   By: David  Martinique M.D.   On: 01/07/2015 07:37   Dg Chest Port 1 View  01/06/2015   CLINICAL DATA:  Central line placement.  EXAM: PORTABLE CHEST - 1 VIEW  COMPARISON:  Single view of the chest earlier this same day.  FINDINGS: Right IJ catheter is identified with the tip projecting over the lower superior vena cava. 2 right chest tubes are in place. Endotracheal tube has been removed. No pneumothorax identified. Small right effusion and basilar airspace disease are seen.  IMPRESSION: Right IJ catheter tip projects over the lower superior vena cava. No pneumothorax  with 2 right chest tubes in place.  Endotracheal tube has been removed.  Small right effusion and basilar airspace disease.   Electronically Signed   By: Inge Rise M.D.   On: 01/06/2015 12:33   Dg Chest Portable 1 View  01/06/2015   CLINICAL DATA:  Postop  EXAM: PORTABLE CHEST - 1 VIEW  COMPARISON:  01/06/2015  FINDINGS: Borderline cardiomegaly is noted. There is double lumen endotracheal tube with lower tip in left main bronchus. Mild atelectasis noted in left upper lobe. Two right-sided chest tubes are noted. No pneumothorax.  IMPRESSION: There is double lumen endotracheal tube with lower tip in left main bronchus. Mild atelectasis noted in left upper lobe. Two right-sided chest tubes are noted. No pneumothorax.   Electronically Signed  By: Lahoma Crocker M.D.   On: 01/06/2015 11:02   US Thoracentesis Asp Pleural Space W/img Guide  01/03/2015   INDICATION: Symptomatic right sided complex loculated pleural effusion.  EXAM: US THORACENTESIS ASP PLEURAL SPACE W/IMG GUIDE  COMPARISON:  CT Chest 01/03/15.  MEDICATIONS: None  COMPLICATIONS: None immediate  TECHNIQUE: Informed written consent was obtained from the patient after a discussion of the risks, benefits and alternatives to treatment. A timeout was performed prior to the initiation of the procedure.  Initial ultrasound scanning demonstrates a right complex loculated pleural effusion. The lower chest was prepped and draped in the usual sterile fashion. 1% lidocaine was used for local anesthesia.  Under direct ultrasound guidance, a 19 gauge, 7-cm, Yueh catheter was introduced. An ultrasound image was saved for documentation purposes. The thoracentesis was performed. The catheter was removed and a dressing was applied. The patient tolerated the procedure well without immediate post procedural complication. The patient was escorted to have an upright chest radiograph.  FINDINGS: A total of approximately 25 ml of serous/blood tinged fluid was removed.  Requested samples were sent to the laboratory.  IMPRESSION: Successful ultrasound-guided right sided thoracentesis yielding 25 ml of pleural fluid.  Read By:  Tsosie Billing PA-C   Electronically Signed   By: Markus Daft M.D.   On: 01/03/2015 15:38    ASSESSMENT/PLAN:  Physical deconditioning - for rehabilitation Right lower lobe empyema secondary to CAPD S/P right VATS with drainage and decortication - continue Augmentin 875-125 milligrams 1 tab by mouth every 12 hours 4 days Left knee effusion -  S/P aspiration of fluid 45cc; continue Celebrex 200 mg by mouth twice a day Constipation - continue senna S1 tab by mouth daily at bedtime when necessary Anemia unspecified - hemoglobin 9.5; will monitor Leukocytosis - wbc 11.3; will monitor Protein calorie malnutrition, severe - albumin 2.7; RD consult   Goals of care:  Short-term rehabilitation   Labs/test ordered:  CBC, BMP, iron level, folate, B12 and TIBC  Spent 50 minutes in patient care.    [facility], NP [employer] [phone]

## 2015-01-22 NOTE — Progress Notes (Signed)
Patient ID: Tina Bell, female   DOB: [birth year], 79 y.o.   MRN: [id number]   01/22/15  Facility:  [facility] Home Location:  [place] Room Number: [id number] LEVEL OF CARE:  SNF (31)   Chief Complaint  Patient presents with  . Discharge Note    Physical deconditioning, right lower lobe empyema S/P VATS, CAP, left knee effusion, constipation, anemia, leukocytosis and protein calorie malnutrition    HISTORY OF PRESENT ILLNESS:  This is a 79 year old female who is for discharge home with Home health PT for endurance, skilled nurse for medication management and CNA for showers. He has been admitted to [facility] on 01/17/15 from [facility]. She has PMH ofbronchiectasis and developed CAP in May 2016. She presented to the pulmonary clinic in May 26 with a new right-sided pleural effusion. Thoracentesis was attempted but no fluid could be removed. She was admitted to the hospital on 5/27 and CT showed right lower lobe opacity with loculated moderate right pleural effusion. She underwent right VATS with drainage and decortication of a right lower lobe empyema. She was discharged on 01/09/15 with home health. She was readmitted to the hospital on 6/07 from clinic follow-up with severe right-sided chest wall pain and weakness. She was complaining of left knee pain. She had arthrocentesis on 6/08. Celebrex was started. She gradually improved but remains weak.   Patient was admitted to this facility for short-term rehabilitation after the patient's recent hospitalization.  Patient has completed SNF rehabilitation and therapy has cleared the patient for discharge.  PAST MEDICAL HISTORY:  Past Medical History  Diagnosis Date  . Pneumonia 1997  . History of kidney stones 7564,3329  . Arthritis     knees  . Complication of anesthesia     difficulty waking up from anesthesia  . Hx of seasonal allergies   . Pleural effusion 12/2014    CURRENT MEDICATIONS: Reviewed  per MAR/see medication list  Allergies  Allergen Reactions  . Keflex [Cephalexin] Hives    Tolerated Augmentin  . Morphine And Related Hives     REVIEW OF SYSTEMS:  GENERAL: no change in appetite, no fatigue, no weight changes, no fever, chills or weakness RESPIRATORY: no cough, SOB, DOE, wheezing, hemoptysis CARDIAC: no chest pain, edema or palpitations GI: no abdominal pain, diarrhea, constipation, heart burn, nausea or vomiting  PHYSICAL EXAMINATION  GENERAL: no acute distress, normal body habitus NECK: supple, trachea midline, no neck masses, no thyroid tenderness, no thyromegaly LYMPHATICS: no LAN in the neck, no supraclavicular LAN RESPIRATORY: breathing is even & unlabored, BS CTAB CARDIAC: RRR, no murmur,no extra heart sounds, no edema GI: abdomen soft, normal BS, no masses, no tenderness, no hepatomegaly, no splenomegaly EXTREMITIES:  Able to move 4 extremities PSYCHIATRIC: the patient is alert & oriented to person, affect & behavior appropriate  LABS/RADIOLOGY: Labs reviewed: 01/21/15  iron 54 TIBC 192 vitamin B12 740 folate>20.0 WBC 7.2 hemoglobin 10.7 hematocrit 32.7 MCV 82.0 platelet 501 sodium 143 potassium 4.7 glucose 79 BUN 19 creatinine 0.78 calcium 51.8 Basic Metabolic Panel:  Recent Labs  01/14/15 1815 01/15/15 0525 01/16/15 0525  NA 138 141 142  K 3.5 3.5 3.8  CL 97* 101 105  CO2 30 28 26   GLUCOSE 133* 93 103*  BUN 19 14 12   CREATININE 0.82 0.79 0.69  CALCIUM 8.7* 9.0 8.6*   Liver Function Tests:  Recent Labs  01/05/15 2106 01/08/15 0430 01/14/15 1815  AST 35 42* 24  ALT 30 28  16  ALKPHOS 144* 113 110  BILITOT 0.3 0.4 0.5  PROT 7.0 5.6* 7.1  ALBUMIN 2.5* 2.0* 2.7*   CBC:  Recent Labs  01/03/15 1956  01/14/15 1815 01/15/15 0525 01/16/15 0525  WBC 8.5  < > 18.5* 14.5* 11.3*  NEUTROABS 6.1  --  15.7*  --   --   HGB 11.4*  < > 9.7* 9.9* 9.5*  HCT 35.1*  < > 30.5* 31.3* 30.3*  MCV 85.0  < > 84.7 85.5 83.2  PLT 443*  < > 406*  432* 435*  < > = values in this interval not displayed.  Cardiac Enzymes:  Recent Labs  01/16/15 1159 01/16/15 1715 01/17/15 0007  TROPONINI <0.03 <0.03 <0.03   CBG:  Recent Labs  01/15/15 0719 01/17/15 0806  GLUCAP 103* 87    Dg Chest 1 View  01/03/2015   CLINICAL DATA:  Status post right-sided thoracentesis.  EXAM: CHEST  1 VIEW  COMPARISON:  01/01/2015 and current chest CT.  FINDINGS: Opacity at the right lung base is similar to the prior chest radiograph. Although a component of this may reflect residual fluid. Patchy of this is likely consolidated lung.  There is no pneumothorax.  No left pleural effusion.  IMPRESSION: 1. No pneumothorax following right-sided thoracentesis. Lung base opacity on the right is without significant change when compared to the prior chest radiograph.   Electronically Signed   By: Lajean Manes M.D.   On: 01/03/2015 15:32   Dg Chest 2 View  01/10/2015   CLINICAL DATA:  Subsequent encounter for pneumothorax. Status post chest tube removal.  EXAM: CHEST  2 VIEW  COMPARISON:  01/09/2015.  FINDINGS: Right chest tube seen previously is been removed in the interval. No evidence for residual right-sided pneumothorax. There is persistent small right pleural effusion with right base atelectasis. Left lung is clear. The cardio pericardial silhouette is enlarged. Telemetry leads overlie the chest.  IMPRESSION: Interval removal of right chest tube without evidence for pneumothorax.  Persistent right small pleural effusion/pleural thickening with basilar atelectasis.   Electronically Signed   By: Misty Stanley M.D.   On: 01/10/2015 07:12   Dg Chest 2 View  01/06/2015   CLINICAL DATA:  Effusion and shortness of Breath  EXAM: CHEST  2 VIEW  COMPARISON:  01/05/2015  FINDINGS: The heart size appears normal. No pleural effusion or edema. No airspace consolidation identified. There is a loculated right pleural effusion containing gas identified which appears unchanged in  volume from previous exam. Overlying compressive type atelectasis noted.  IMPRESSION: 1. No change in loculated, gas containing right pleural effusion with overlying consolidation.   Electronically Signed   By: Kerby Moors M.D.   On: 01/06/2015 08:06   Dg Chest 2 View  01/05/2015   CLINICAL DATA:  Followup right pleural effusion.  Subsequent exam.  EXAM: CHEST  2 VIEW  COMPARISON:  01/04/2015.  FINDINGS: Right pleural effusion appears mildly decreased in size from the lateral view. There is a minimal left pleural effusion. Associated right lower lobe opacity and mild posterior right middle lobe opacity are stable. There is a interstitial thickening and mild tree-in-bud type opacities noted in the right upper lobe as well as a right middle and lower lobes.  No new areas of lung opacity.  Mild scarring noted at the apices.  Cardiac silhouette is normal size.  No mediastinal hilar masses.  IMPRESSION: 1. Mild decrease in the right pleural effusions the previous day's study. 2. No other change.  Persistent right lower lobe right middle lobe consolidation or are persistent headaches tree-in-bud type right lung opacities. No new lung abnormalities.   Electronically Signed   By: Lajean Manes M.D.   On: 01/05/2015 09:19   Dg Chest 2 View  01/04/2015   CLINICAL DATA:  Pneumonia, pleural effusion  EXAM: CHEST  2 VIEW  COMPARISON:  01/03/2015  FINDINGS: Aeration is improved with persistent dense right lower lobe consolidation and moderate right pleural effusion. Trace left pleural fluid. Heart size upper limits of normal. The aorta is unfolded and ectatic.  IMPRESSION: Improved overall aeration with stable right lower lobe consolidation compatible with pneumonia and moderate right pleural effusion.   Electronically Signed   By: Conchita Paris M.D.   On: 01/04/2015 09:07   Ct Chest Wo Contrast  01/03/2015   CLINICAL DATA:  Patient with history of pneumonia status post thoracentesis this morning. Only a small amount  of fluid was able to be aspirated.  EXAM: CT CHEST WITHOUT CONTRAST  TECHNIQUE: Multidetector CT imaging of the chest was performed following the standard protocol without IV contrast.  COMPARISON:  Chest radiograph 01/01/2015; 12/17/2014  FINDINGS: Mediastinum/Nodes: Visualized thyroid is unremarkable. No enlarged axillary, mediastinal or hilar lymphadenopathy. Heart is normal in size. The ascending thoracic aorta is dilated measuring 4 cm. Main pulmonary artery is unremarkable.  Lungs/Pleura: Central airways are patent. Dense consolidative opacification within the right lower lobe. Moderate loculated right pleural effusion. There are multiple scattered tree-in-bud nodular opacities demonstrated particularly within the right upper and right middle lobes with a few scattered nodules within the left lower lobe. Mild atelectasis within the lingula. No pneumothorax.  Upper abdomen: The adrenal glands are unremarkable. The noncontrast enhanced appearance of the visualized liver is unremarkable.  Musculoskeletal: No aggressive or acute appearing osseous lesions.  IMPRESSION: Large consolidative pulmonary opacity within the right lower lobe. There is an associated loculated moderate right pleural effusion within the lower right hemithorax. Overall findings are concerning for pneumonia and parapneumonic effusion.  Scattered tree-in-bud nodular opacities within the right upper and middle lobes as well as left lower lobe. Findings are likely infectious or inflammatory in etiology.  Recommend continued radiographic followup to ensure resolution.  Ascending thoracic aorta measures 4 cm. Recommend semi-annual imaging followup by CTA or MRA and referral to cardiothoracic surgery if not already obtained. This recommendation follows 2010 ACCF/AHA/AATS/ACR/ASA/SCA/SCAI/SIR/STS/SVM Guidelines for the Diagnosis and Management of Patients With Thoracic Aortic Disease. Circulation. 2010; 121: Y301-S01   Electronically Signed   By: Lovey Newcomer M.D.   On: 01/03/2015 13:58   Portable Chest 1 View  01/14/2015   CLINICAL DATA:  Empyema lung.  Right-sided chest pain at incision.  EXAM: PORTABLE CHEST - 1 VIEW  COMPARISON:  01/10/2015  FINDINGS: Persistent small right pleural effusion and right base atelectasis or scarring, stable. No pneumothorax. Left lung is clear. Heart is upper limits normal in size. Diffuse interstitial prominence throughout the lungs, likely chronic interstitial lung disease. No acute bony abnormality.  IMPRESSION: Stable small right pleural effusion with right basilar atelectasis or scarring.  Stable chronic interstitial prominence.   Electronically Signed   By: Rolm Baptise M.D.   On: 01/14/2015 18:18   Dg Chest Port 1 View  01/09/2015   CLINICAL DATA:  In right-sided empyema inlet with stress chest tube treatment, history of bronchiectasis and community-acquired pneumonia  EXAM: PORTABLE CHEST - 1 VIEW  COMPARISON:  Portable chest x-ray of January 08, 2015  FINDINGS: The left lung is well-expanded  and clear. On the right there is improved aeration but mild persistent volume loss. There is pleural fluid -pleural thickening at the right lung base which is stable. There is associated atelectasis or infiltrate. The upper chest tube has been removed. The right lower chest tube is stable with the tip overlying the medial aspect of the eleventh rib. The cardiac silhouette is mildly enlarged. The pulmonary vascularity is normal. The right internal jugular venous catheter tip projects over the midportion of the SVC.  IMPRESSION: Improved aeration of the right lung. No pneumothorax or significant increase in pleural fluid since removal of the upper chest tube. Persistent right basilar pleural fluid and/or residual empyema with associated atelectasis.   Electronically Signed   By: David  Martinique M.D.   On: 01/09/2015 07:37   Dg Chest Port 1 View  01/08/2015   CLINICAL DATA:  Chest tube in place. Status post thoracotomy and empyema  drainage.  EXAM: PORTABLE CHEST - 1 VIEW  COMPARISON:  01/07/2015  FINDINGS: Right jugular central venous catheter projects over the mid SVC, unchanged. 2 right-sided chest tubes remain in place. Thoracic aortic calcification is noted. Cardiac silhouette appears mildly enlarged. There is persistent blunting of the costophrenic angles compatible with small pleural effusions, right larger than left. Patchy right basilar parenchymal lung opacity has mildly increased. Patchy retrocardiac opacity in the left lower lobe is stable to slightly improved. No pneumothorax is identified.  IMPRESSION: 1. Mildly increased right basilar opacity may represent increased atelectasis. Infection is not excluded. 2. Unchanged left basilar subsegmental atelectasis. 3. Small bilateral pleural effusions.   Electronically Signed   By: Logan Bores   On: 01/08/2015 08:05   Dg Chest Port 1 View  01/07/2015   CLINICAL DATA:  Status post thoracostomy an empyema drainage on the right  EXAM: PORTABLE CHEST - 1 VIEW  COMPARISON:  Portable chest x-ray of Jan 06, 2015  FINDINGS: The lungs are adequately inflated. Two right-sided chest tubes persists. There is no pneumothorax. A small amount of pleural fluid and right basilar atelectasis are noted. There is minimal retrocardiac density on the left slightly more conspicuous today. The cardiac silhouette is mildly enlarged. The pulmonary vascularity is normal. The right internal jugular venous catheter tip projects over the midportion of the SVC. The bony thorax exhibits no acute abnormality.  IMPRESSION: Fairly stable appearance of the right hemi thorax with persistent small basilar pleural effusion. There is no pneumothorax. Increased subsegmental atelectasis at the left lung base.   Electronically Signed   By: David  Martinique M.D.   On: 01/07/2015 07:37   Dg Chest Port 1 View  01/06/2015   CLINICAL DATA:  Central line placement.  EXAM: PORTABLE CHEST - 1 VIEW  COMPARISON:  Single view of the  chest earlier this same day.  FINDINGS: Right IJ catheter is identified with the tip projecting over the lower superior vena cava. 2 right chest tubes are in place. Endotracheal tube has been removed. No pneumothorax identified. Small right effusion and basilar airspace disease are seen.  IMPRESSION: Right IJ catheter tip projects over the lower superior vena cava. No pneumothorax with 2 right chest tubes in place.  Endotracheal tube has been removed.  Small right effusion and basilar airspace disease.   Electronically Signed   By: Inge Rise M.D.   On: 01/06/2015 12:33   Dg Chest Portable 1 View  01/06/2015   CLINICAL DATA:  Postop  EXAM: PORTABLE CHEST - 1 VIEW  COMPARISON:  01/06/2015  FINDINGS: Borderline  cardiomegaly is noted. There is double lumen endotracheal tube with lower tip in left main bronchus. Mild atelectasis noted in left upper lobe. Two right-sided chest tubes are noted. No pneumothorax.  IMPRESSION: There is double lumen endotracheal tube with lower tip in left main bronchus. Mild atelectasis noted in left upper lobe. Two right-sided chest tubes are noted. No pneumothorax.   Electronically Signed   By: Lahoma Crocker M.D.   On: 01/06/2015 11:02   US Thoracentesis Asp Pleural Space W/img Guide  01/03/2015   INDICATION: Symptomatic right sided complex loculated pleural effusion.  EXAM: US THORACENTESIS ASP PLEURAL SPACE W/IMG GUIDE  COMPARISON:  CT Chest 01/03/15.  MEDICATIONS: None  COMPLICATIONS: None immediate  TECHNIQUE: Informed written consent was obtained from the patient after a discussion of the risks, benefits and alternatives to treatment. A timeout was performed prior to the initiation of the procedure.  Initial ultrasound scanning demonstrates a right complex loculated pleural effusion. The lower chest was prepped and draped in the usual sterile fashion. 1% lidocaine was used for local anesthesia.  Under direct ultrasound guidance, a 19 gauge, 7-cm, Yueh catheter was introduced.  An ultrasound image was saved for documentation purposes. The thoracentesis was performed. The catheter was removed and a dressing was applied. The patient tolerated the procedure well without immediate post procedural complication. The patient was escorted to have an upright chest radiograph.  FINDINGS: A total of approximately 25 ml of serous/blood tinged fluid was removed. Requested samples were sent to the laboratory.  IMPRESSION: Successful ultrasound-guided right sided thoracentesis yielding 25 ml of pleural fluid.  Read By:  Tsosie Billing PA-C   Electronically Signed   By: Markus Daft M.D.   On: 01/03/2015 15:38    ASSESSMENT/PLAN:  Physical deconditioning - for rehabilitation Right lower lobe empyema secondary to CAP S/P right VATS with drainage and decortication - finished Augmentin course; resolved Left knee effusion -  S/P aspiration of fluid 45cc; continue Celebrex 200 mg by mouth twice a day Constipation - continue senna S1 tab by mouth daily at bedtime when necessary Anemia unspecified - hemoglobin 10.7;stable Leukocytosis - wbc 7.2; resolved (down from 11.3 taken 6/09) Protein calorie malnutrition, severe - albumin 2.7; continue supplementation   I have filled out patient's discharge paperwork and written prescriptions.  Patient will receive home health PT, skilled Nurse and CNA.  DME provided:  Rolling walker and 3-in-1 bedside commode  Total discharge time: Greater than 30 minutes  Discharge time involved coordination of the discharge process with Education officer, museum, nursing staff and therapy department. Medical justification for home health services/DME verified.    [facility], NP [employer] [phone]

## 2015-01-27 ENCOUNTER — Other Ambulatory Visit: Payer: Self-pay | Admitting: Cardiothoracic Surgery

## 2015-01-27 DIAGNOSIS — J869 Pyothorax without fistula: Secondary | ICD-10-CM

## 2015-01-29 ENCOUNTER — Ambulatory Visit
Admission: RE | Admit: 2015-01-29 | Discharge: 2015-01-29 | Disposition: A | Discharge status: Home / Self Care | Payer: Medicare Other | Source: Ambulatory Visit | Attending: Cardiothoracic Surgery | Admitting: Cardiothoracic Surgery

## 2015-01-29 ENCOUNTER — Encounter: Payer: Self-pay | Admitting: Cardiothoracic Surgery

## 2015-01-29 ENCOUNTER — Ambulatory Visit (INDEPENDENT_AMBULATORY_CARE_PROVIDER_SITE_OTHER): Payer: Self-pay | Admitting: Cardiothoracic Surgery

## 2015-01-29 VITALS — BP 120/86 | HR 76 | Resp 20 | Ht 63.0 in | Wt 129.0 lb

## 2015-01-29 DIAGNOSIS — J869 Pyothorax without fistula: Secondary | ICD-10-CM

## 2015-01-29 NOTE — Progress Notes (Signed)
PCP is Horatio Pel, MD Referring Provider is Deland Pretty, MD  Chief Complaint  Patient presents with  . Routine Post Op    2 week f/u from surgery with CXR, s/p RT VATS,  mini thoracotomy, drainage of loculated empyema, decortication 01/06/15    DSK:AJGOTL visit following right VATS for empyema 3 weeks ago. Operative cultures grew Staphylococcus sensitive to Levaquin, resistant to penicillin. The patient was readmitted approximately week after discharge for weakness and left knee swelling and pain from chronic arthritis-this required a arthrodesis and Celebrex with improvement. She is now back home with minimal postthoracotomy pain. She is gaining strength, improving appetite and incision has healed well. Chest x-ray today shows no evidence recurrent empyema. She is currently off antibiotics.   Past Medical History  Diagnosis Date  . Pneumonia 1997  . History of kidney stones [id number]  . Arthritis     knees  . Complication of anesthesia     difficulty waking up from anesthesia  . Hx of seasonal allergies   . Pleural effusion 12/2014    Past Surgical History  Procedure Laterality Date  . Bladder suspension  1998  . Knee arthroscopy Right 1999  . Nose surgery  1989    repair of deviated septum  . Dilation and curettage of uterus      3 procedures in the 1980's for spotting  . Video assisted thoracoscopy (vats)/empyema Right 01/06/2015    Procedure: VIDEO ASSISTED THORACOSCOPY (VATS)/EMPYEMA AND DRAINAGE OF EMPYEMA;  Surgeon: Ivin Poot, MD;  Location: [facility];  Service: Thoracic;  Laterality: Right;    Family History  Problem Relation Age of Onset  . Cancer Mother     ovarian  . Cancer Sister     breast    Social History History  Substance Use Topics  . Smoking status: Former Smoker -- 0.20 packs/day for 5 years    Types: Cigarettes    Quit date: 05/03/1962  . Smokeless tobacco: Never Used     Comment: Smoked 1 pack/week while smoking.   . Alcohol Use:  Yes     Comment: occasional drink    Current Outpatient Prescriptions  Medication Sig Dispense Refill  . acetaminophen (TYLENOL) 325 MG tablet Take 2 tablets (650 mg total) by mouth every 6 (six) hours as needed for mild pain (or Fever >/= 101).    Marland Kitchen acidophilus (RISAQUAD) CAPS capsule Take 1 capsule by mouth daily.    . AMBULATORY NON FORMULARY MEDICATION Medication Name: Med Pass 120 mL by mouth twice daily    . calcium-vitamin D (OSCAL WITH D) 500-200 MG-UNIT per tablet Take 1 tablet by mouth daily with breakfast.     . celecoxib (CELEBREX) 200 MG capsule Take 1 capsule (200 mg total) by mouth 2 (two) times daily.    Javier Docker Oil 1000 MG CAPS Take 1 capsule by mouth 2 (two) times daily.     . Multiple Vitamins-Minerals (WOMENS MULTI VITAMIN & MINERAL PO) Take by mouth daily.    Marland Kitchen Respiratory Therapy Supplies (FLUTTER) DEVI Use 2-4 times daily 1 each 0  . vitamin C (ASCORBIC ACID) 500 MG tablet Take 500 mg by mouth daily.     No current facility-administered medications for this visit.   Facility-Administered Medications Ordered in Other Visits  Medication Dose Route Frequency Provider Last Rate Last Dose  . levofloxacin (LEVAQUIN) IVPB 500 mg  500 mg Intravenous Q24H Festus Aloe, MD        Allergies  Allergen Reactions  . Keflex [  Cephalexin] Hives    Tolerated Augmentin  . Morphine And Related Hives    Review of Systems  No fever  no shortness of breath No productive cough Chest tube sutures removed today in the office, healing well  BP 120/86 mmHg  Pulse 76  Resp 20  Ht 5\' 3"  (1.6 m)  Wt 129 lb (58.514 kg)  BMI 22.86 kg/m2  SpO2 96% Physical Exam Alert and oriented Ambulates down the office hallway without difficulty Lungs clear bilaterally Heart rate regular Incision well-healed  Diagnostic Tests: Chest x-ray clear, some pleural thickening of the right base otherwise unremarkable  Impression: Making good progress in recovery after right VATS for  lingering empyema-operative culture of pleural peel positive for Staphylococcus. We'll continue another week of oral Levaquin. Patient encouraged to start driving and increasing walking distances. Weight limit for lifting is 20 pounds. We'll plan on seeing the patient back in 8 weeks with chest x-ray.  The patient's preoperative CT scan shows an asymptomatic mildly dilated ascending thoracic aorta 4.0 cm. Will plan on following this with an annual CT scan but doubt she would ever need surgical intervention at this point.  Plan:return in 8 weeks with chest x-ray   Len Childs, MD [facility] [phone]

## 2015-01-31 LAB — FUNGUS CULTURE W SMEAR: Fungal Smear: NONE SEEN

## 2015-02-20 ENCOUNTER — Other Ambulatory Visit: Payer: Self-pay

## 2015-02-20 DIAGNOSIS — Z1231 Encounter for screening mammogram for malignant neoplasm of breast: Secondary | ICD-10-CM

## 2015-03-06 ENCOUNTER — Encounter: Payer: Self-pay | Admitting: Pulmonary Disease

## 2015-03-06 ENCOUNTER — Ambulatory Visit (INDEPENDENT_AMBULATORY_CARE_PROVIDER_SITE_OTHER): Payer: Medicare Other | Admitting: Pulmonary Disease

## 2015-03-06 VITALS — BP 134/84 | HR 79 | Ht 63.0 in | Wt 123.0 lb

## 2015-03-06 DIAGNOSIS — J869 Pyothorax without fistula: Secondary | ICD-10-CM | POA: Diagnosis not present

## 2015-03-06 DIAGNOSIS — J470 Bronchiectasis with acute lower respiratory infection: Secondary | ICD-10-CM | POA: Diagnosis not present

## 2015-03-06 NOTE — Progress Notes (Signed)
Subjective:    Patient ID: Tina Bell, female    DOB: [birth year], 79 y.o.   MRN: [id number]  Synopsis: First referred to [facility] in 2015. Found to have bronchiectasis on high-resolution CT in 2015 this is believed to be due to a severe pertussis infection as a child. Never smoked 12/2013 CT chest high res (entrikin read)> scattered throughout the lungs, particularly in the lingula and RML, there is cylindrical bronchiectasis and bronchiolectasis with nodularity suggestive of MAI, extensive air trapping and atherosclerosis, small hiatal hernia June 2015 pulmonary function test> normal ratio, FEV1 93% predicted, no change with bronchodilator, total lung capacity normal, DLCO slightly low at 73% predicted In June 2016 she had community acquired pneumonia with an empyema required VATs  HPI  Chief Complaint  Patient presents with  . Follow-up    pt doing well today.     Tina Bell says that she has been feeling great since her hospital stay. She is not quite back to playing tennis yet.  She is to have a knee replacement this year in October. Her breathing has been fine without trouble.  They are planning to move to an assisted living facility.    Past Medical History  Diagnosis Date  . Pneumonia 1997  . History of kidney stones 4270,6237  . Arthritis     knees  . Complication of anesthesia     difficulty waking up from anesthesia  . Hx of seasonal allergies   . Pleural effusion 12/2014     Review of Systems  Constitutional: Negative for fever, chills and fatigue.  HENT: Negative for rhinorrhea and sinus pressure.   Respiratory: Negative for cough, chest tightness, shortness of breath and wheezing.   Cardiovascular: Negative for chest pain, palpitations and leg swelling.       Objective:   Physical Exam Filed Vitals:   03/06/15 1443  BP: 134/84  Pulse: 79  Height: 5\' 3"  (1.6 m)  Weight: 123 lb (55.792 kg)  SpO2: 96%   RA  Gen: no acute distress HEENT: NCAT,  OP clear, PULM: CTA B, few crackles R base, normal effort CV: RRR, no mgr, no JVD AB: BS+, soft, nontender, no hsm Ext: warm, no edema, no clubbing, no cyanosis Derm: no rash or skin breakdown      Assessment & Plan:   Bronchiectasis without acute exacerbation Despite the episode of community-acquired pneumonia and empyema, she has recovered well and has not had an exacerbation of her bronchiectasis.  She only has mild airflow obstruction and there is no reason why she should not have her knee surgery this fall.  Plan: Continue mucociliary clearance measures on a when necessary basis when she has an upper respiratory infection Aggressive antibiotics treatment at the first sign of an infection Should she have an upper respiratory infection again I would treat with a broader antibiotics such as Augmentin  Empyema lung Personally reviewed the chest x-ray from when she saw Dr. Nils Pyle last month which shows some slight scarring in the right lower lobe but otherwise her right lung looks great. Today on exam her lungs are clear and she has been doing well.  Plan: Follow-up with Dr. Nils Pyle again in August    Updated Medication List Outpatient Encounter Prescriptions as of 03/06/2015  Medication Sig  . calcium-vitamin D (OSCAL WITH D) 500-200 MG-UNIT per tablet Take 1 tablet by mouth daily with breakfast.   . celecoxib (CELEBREX) 200 MG capsule Take 1 capsule (200 mg total) by mouth  2 (two) times daily.  Tina Bell Oil 1000 MG CAPS Take 1 capsule by mouth 2 (two) times daily.   . Multiple Vitamins-Minerals (WOMENS MULTI VITAMIN & MINERAL PO) Take by mouth daily.  Marland Kitchen Respiratory Therapy Supplies (FLUTTER) DEVI Use 2-4 times daily  . vitamin C (ASCORBIC ACID) 500 MG tablet Take 500 mg by mouth daily.  . [DISCONTINUED] acetaminophen (TYLENOL) 325 MG tablet Take 2 tablets (650 mg total) by mouth every 6 (six) hours as needed for mild pain (or Fever >/= 101). (Patient not taking: Reported  on 03/06/2015)  . [DISCONTINUED] acidophilus (RISAQUAD) CAPS capsule Take 1 capsule by mouth daily. (Patient not taking: Reported on 03/06/2015)  . [DISCONTINUED] AMBULATORY NON FORMULARY MEDICATION Medication Name: Med Pass 120 mL by mouth twice daily   Facility-Administered Encounter Medications as of 03/06/2015  Medication  . levofloxacin (LEVAQUIN) IVPB 500 mg

## 2015-03-06 NOTE — Patient Instructions (Signed)
Keep taking your medications as you are doing From my standpoint there is nothing that needs to be done prior to your knee surgery, you are okay to proceed at this time We will see you back in mid-November

## 2015-03-06 NOTE — Assessment & Plan Note (Signed)
Personally reviewed the chest x-ray from when she saw Dr. Nils Pyle last month which shows some slight scarring in the right lower lobe but otherwise her right lung looks great. Today on exam her lungs are clear and she has been doing well.  Plan: Follow-up with Dr. Nils Pyle again in August

## 2015-03-06 NOTE — Assessment & Plan Note (Signed)
Despite the episode of community-acquired pneumonia and empyema, she has recovered well and has not had an exacerbation of her bronchiectasis.  She only has mild airflow obstruction and there is no reason why she should not have her knee surgery this fall.  Plan: Continue mucociliary clearance measures on a when necessary basis when she has an upper respiratory infection Aggressive antibiotics treatment at the first sign of an infection Should she have an upper respiratory infection again I would treat with a broader antibiotics such as Augmentin

## 2015-03-27 ENCOUNTER — Ambulatory Visit
Admission: RE | Admit: 2015-03-27 | Discharge: 2015-03-27 | Disposition: A | Discharge status: Home / Self Care | Payer: Medicare Other | Source: Ambulatory Visit

## 2015-03-27 DIAGNOSIS — Z1231 Encounter for screening mammogram for malignant neoplasm of breast: Secondary | ICD-10-CM

## 2015-04-01 ENCOUNTER — Other Ambulatory Visit: Payer: Self-pay | Admitting: Cardiothoracic Surgery

## 2015-04-01 DIAGNOSIS — J869 Pyothorax without fistula: Secondary | ICD-10-CM

## 2015-04-02 ENCOUNTER — Ambulatory Visit
Admission: RE | Admit: 2015-04-02 | Discharge: 2015-04-02 | Disposition: A | Discharge status: Home / Self Care | Payer: Medicare Other | Source: Ambulatory Visit | Attending: Cardiothoracic Surgery | Admitting: Cardiothoracic Surgery

## 2015-04-02 ENCOUNTER — Ambulatory Visit (INDEPENDENT_AMBULATORY_CARE_PROVIDER_SITE_OTHER): Payer: Self-pay | Admitting: Cardiothoracic Surgery

## 2015-04-02 ENCOUNTER — Encounter: Payer: Self-pay | Admitting: Cardiothoracic Surgery

## 2015-04-02 VITALS — BP 135/85 | HR 60 | Resp 20 | Ht 63.0 in | Wt 123.0 lb

## 2015-04-02 DIAGNOSIS — J869 Pyothorax without fistula: Secondary | ICD-10-CM

## 2015-04-02 DIAGNOSIS — I719 Aortic aneurysm of unspecified site, without rupture: Secondary | ICD-10-CM

## 2015-04-02 NOTE — Progress Notes (Signed)
PCP is Horatio Pel, MD Referring Provider is Deland Pretty, MD  Chief Complaint  Patient presents with  . Routine Post Op    2 month f/u with CXR    HPI: Final postop followup after right VATS, drainage of empyema and decortication of right lower lobe. Pathology specimens all benign with fibrinopurulent material. Patient is fully recovered. No shortness of breath.  Patient is history of bronchiectasis.she is never smoked. Preop CT scan showed some small bilateral nodules. We'll get followup CT scan in 4 months.  Past Medical History  Diagnosis Date  . Pneumonia 1997  . History of kidney stones [id number]  . Arthritis     knees  . Complication of anesthesia     difficulty waking up from anesthesia  . Hx of seasonal allergies   . Pleural effusion 12/2014    Past Surgical History  Procedure Laterality Date  . Bladder suspension  1998  . Knee arthroscopy Right 1999  . Nose surgery  1989    repair of deviated septum  . Dilation and curettage of uterus      3 procedures in the 1980's for spotting  . Video assisted thoracoscopy (vats)/empyema Right 01/06/2015    Procedure: VIDEO ASSISTED THORACOSCOPY (VATS)/EMPYEMA AND DRAINAGE OF EMPYEMA;  Surgeon: Ivin Poot, MD;  Location: [facility];  Service: Thoracic;  Laterality: Right;    Family History  Problem Relation Age of Onset  . Cancer Mother     ovarian  . Cancer Sister     breast    Social History Social History  Substance Use Topics  . Smoking status: Former Smoker -- 0.20 packs/day for 5 years    Types: Cigarettes    Quit date: 05/03/1962  . Smokeless tobacco: Never Used     Comment: Smoked 1 pack/week while smoking.   . Alcohol Use: Yes     Comment: occasional drink    Current Outpatient Prescriptions  Medication Sig Dispense Refill  . calcium-vitamin D (OSCAL WITH D) 500-200 MG-UNIT per tablet Take 1 tablet by mouth 2 (two) times daily.     . celecoxib (CELEBREX) 200 MG capsule Take 1 capsule (200 mg  total) by mouth 2 (two) times daily.    Javier Docker Oil 1000 MG CAPS Take 1 capsule by mouth 2 (two) times daily.     . Multiple Vitamins-Minerals (WOMENS MULTI VITAMIN & MINERAL PO) Take by mouth daily.    Marland Kitchen Respiratory Therapy Supplies (FLUTTER) DEVI Use 2-4 times daily 1 each 0  . vitamin C (ASCORBIC ACID) 500 MG tablet Take 500 mg by mouth daily.     No current facility-administered medications for this visit.   Facility-Administered Medications Ordered in Other Visits  Medication Dose Route Frequency Provider Last Rate Last Dose  . levofloxacin (LEVAQUIN) IVPB 500 mg  500 mg Intravenous Q24H Festus Aloe, MD        Allergies  Allergen Reactions  . Keflex [Cephalexin] Hives    Tolerated Augmentin  . Morphine And Related Hives    Review of Systems   No incisional pain No shortness of breath No limitations of activity  BP 135/85 mmHg  Pulse 60  Resp 20  Ht 5\' 3"  (1.6 m)  Wt 123 lb (55.792 kg)  BMI 21.79 kg/m2  SpO2 96% Physical Exam Alert and comfortable Lungs clear Heart rate regular no murmur Right chest incision well-healed  Diagnostic Tests: Chest x-ray clear Some residual pleural thickening and right costophrenic angle  Impression: Excellent recovery after VATS or  empyema  Plan: Return with surveillance CT scan of the chest in 4 months for review of probable minor inflammatory changes seen on initial CT scan preop  Len Childs, MD [facility] [phone]

## 2015-04-17 ENCOUNTER — Ambulatory Visit: Payer: Self-pay | Admitting: Orthopedic Surgery

## 2015-04-17 NOTE — Progress Notes (Signed)
Preoperative surgical orders have been place into the Epic hospital system for Tina Bell on 04/17/2015, 9:36 AM  by Mickel Crow for surgery on 05-12-2015.  Preop Total Knee orders including Experal, IV Tylenol, and IV Decadron as long as there are no contraindications to the above medications. [name], PA-C

## 2015-05-02 NOTE — Patient Instructions (Signed)
Tina Bell  05/02/2015   Your procedure is scheduled on: May 12, 2015  Report to [facility] Main  Entrance take [place]  elevators to 3rd floor to  [place] at  5:15 AM.  Call this number if you have problems the morning of surgery [phone]   Remember: ONLY 1 PERSON MAY GO WITH YOU TO SHORT STAY TO GET  READY MORNING OF [name].  Do not eat food or drink liquids :After Midnight.     Take these medicines the morning of surgery with A SIP OF WATER: None                                You may not have any metal on your body including hair pins and              piercings  Do not wear jewelry, make-up, lotions, powders or perfumes, deodorant             Do not wear nail polish.  Do not shave  48 hours prior to surgery.              Do not bring valuables to the hospital. [place].  Contacts, dentures or bridgework may not be worn into surgery.  Leave suitcase in the car. After surgery it may be brought to your room.         Special Instructions: coughing and deep breathing exercises, leg exercises              Please read over the following fact sheets you were given: _____________________________________________________________________             [facility] - Preparing for Surgery Before surgery, you can play an important role.  Because skin is not sterile, your skin needs to be as free of germs as possible.  You can reduce the number of germs on your skin by washing with CHG (chlorahexidine gluconate) soap before surgery.  CHG is an antiseptic cleaner which kills germs and bonds with the skin to continue killing germs even after washing. Please DO NOT use if you have an allergy to CHG or antibacterial soaps.  If your skin becomes reddened/irritated stop using the CHG and inform your nurse when you arrive at Short Stay. Do not shave (including legs and underarms) for at least 48 hours  prior to the first CHG shower.  You may shave your face/neck. Please follow these instructions carefully:  1.  Shower with CHG Soap the night before surgery and the  morning of Surgery.  2.  If you choose to wash your hair, wash your hair first as usual with your  normal  shampoo.  3.  After you shampoo, rinse your hair and body thoroughly to remove the  shampoo.                           4.  Use CHG as you would any other liquid soap.  You can apply chg directly  to the skin and wash                       Gently with a scrungie or clean  washcloth.  5.  Apply the CHG Soap to your body ONLY FROM THE NECK DOWN.   Do not use on face/ open                           Wound or open sores. Avoid contact with eyes, ears mouth and genitals (private parts).                       Wash face,  Genitals (private parts) with your normal soap.             6.  Wash thoroughly, paying special attention to the area where your surgery  will be performed.  7.  Thoroughly rinse your body with warm water from the neck down.  8.  DO NOT shower/wash with your normal soap after using and rinsing off  the CHG Soap.                9.  Pat yourself dry with a clean towel.            10.  Wear clean pajamas.            11.  Place clean sheets on your bed the night of your first shower and do not  sleep with pets. Day of Surgery : Do not apply any lotions/deodorants the morning of surgery.  Please wear clean clothes to the hospital/surgery center.  FAILURE TO FOLLOW THESE INSTRUCTIONS MAY RESULT IN THE CANCELLATION OF YOUR SURGERY PATIENT SIGNATURE_________________________________  NURSE SIGNATURE__________________________________  ________________________________________________________________________   Tina Bell  An incentive spirometer is a tool that can help keep your lungs clear and active. This tool measures how well you are filling your lungs with each breath. Taking long deep breaths may help reverse  or decrease the chance of developing breathing (pulmonary) problems (especially infection) following:  A long period of time when you are unable to move or be active. BEFORE THE PROCEDURE   If the spirometer includes an indicator to show your best effort, your nurse or respiratory therapist will set it to a desired goal.  If possible, sit up straight or lean slightly forward. Try not to slouch.  Hold the incentive spirometer in an upright position. INSTRUCTIONS FOR USE   Sit on the edge of your bed if possible, or sit up as far as you can in bed or on a chair.  Hold the incentive spirometer in an upright position.  Breathe out normally.  Place the mouthpiece in your mouth and seal your lips tightly around it.  Breathe in slowly and as deeply as possible, raising the piston or the ball toward the top of the column.  Hold your breath for 3-5 seconds or for as long as possible. Allow the piston or ball to fall to the bottom of the column.  Remove the mouthpiece from your mouth and breathe out normally.  Rest for a few seconds and repeat Steps 1 through 7 at least 10 times every 1-2 hours when you are awake. Take your time and take a few normal breaths between deep breaths.  The spirometer may include an indicator to show your best effort. Use the indicator as a goal to work toward during each repetition.  After each set of 10 deep breaths, practice coughing to be sure your lungs are clear. If you have an incision (the cut made at the time of surgery), support your incision when coughing by  placing a pillow or rolled up towels firmly against it. Once you are able to get out of bed, walk around indoors and cough well. You may stop using the incentive spirometer when instructed by your caregiver.  RISKS AND COMPLICATIONS  Take your time so you do not get dizzy or light-headed.  If you are in pain, you may need to take or ask for pain medication before doing incentive spirometry. It is  harder to take a deep breath if you are having pain. AFTER USE  Rest and breathe slowly and easily.  It can be helpful to keep track of a log of your progress. Your caregiver can provide you with a simple table to help with this. If you are using the spirometer at home, follow these instructions: [place] IF:   You are having difficultly using the spirometer.  You have trouble using the spirometer as often as instructed.  Your pain medication is not giving enough relief while using the spirometer.  You develop fever of 100.5 F (38.1 C) or higher. SEEK IMMEDIATE MEDICAL CARE IF:   You cough up bloody sputum that had not been present before.  You develop fever of 102 F (38.9 C) or greater.  You develop worsening pain at or near the incision site. MAKE SURE YOU:   Understand these instructions.  Will watch your condition.  Will get help right away if you are not doing well or get worse. Document Released: 12/06/2006 Document Revised: 10/18/2011 Document Reviewed: 02/06/2007 ExitCare Patient Information 2014 Exit[employer], [place].   ________________________________________________________________________  WHAT IS A BLOOD TRANSFUSION? Blood Transfusion Information  A transfusion is the replacement of blood or some of its parts. Blood is made up of multiple cells which provide different functions.  Red blood cells carry oxygen and are used for blood loss replacement.  White blood cells fight against infection.  Platelets control bleeding.  Plasma helps clot blood.  Other blood products are available for specialized needs, such as hemophilia or other clotting disorders. BEFORE THE TRANSFUSION  Who gives blood for transfusions?   Healthy volunteers who are fully evaluated to make sure their blood is safe. This is blood bank blood. Transfusion therapy is the safest it has ever been in the practice of medicine. Before blood is taken from a donor, a complete history  is taken to make sure that person has no history of diseases nor engages in risky social behavior (examples are intravenous drug use or sexual activity with multiple partners). The donor's travel history is screened to minimize risk of transmitting infections, such as malaria. The donated blood is tested for signs of infectious diseases, such as HIV and hepatitis. The blood is then tested to be sure it is compatible with you in order to minimize the chance of a transfusion reaction. If you or a relative donates blood, this is often done in anticipation of surgery and is not appropriate for emergency situations. It takes many days to process the donated blood. RISKS AND COMPLICATIONS Although transfusion therapy is very safe and saves many lives, the main dangers of transfusion include:   Getting an infectious disease.  Developing a transfusion reaction. This is an allergic reaction to something in the blood you were given. Every precaution is taken to prevent this. The decision to have a blood transfusion has been considered carefully by your caregiver before blood is given. Blood is not given unless the benefits outweigh the risks. AFTER THE TRANSFUSION  Right after receiving a blood  transfusion, you will usually feel much better and more energetic. This is especially true if your red blood cells have gotten low (anemic). The transfusion raises the level of the red blood cells which carry oxygen, and this usually causes an energy increase.  The nurse administering the transfusion will monitor you carefully for complications. HOME CARE INSTRUCTIONS  No special instructions are needed after a transfusion. You may find your energy is better. Speak with your caregiver about any limitations on activity for underlying diseases you may have. SEEK MEDICAL CARE IF:   Your condition is not improving after your transfusion.  You develop redness or irritation at the intravenous (IV) site. SEEK IMMEDIATE  MEDICAL CARE IF:  Any of the following symptoms occur over the next 12 hours:  Shaking chills.  You have a temperature by mouth above 102 F (38.9 C), not controlled by medicine.  Chest, back, or muscle pain.  People around you feel you are not acting correctly or are confused.  Shortness of breath or difficulty breathing.  Dizziness and fainting.  You get a rash or develop hives.  You have a decrease in urine output.  Your urine turns a dark color or changes to pink, red, or brown. Any of the following symptoms occur over the next 10 days:  You have a temperature by mouth above 102 F (38.9 C), not controlled by medicine.  Shortness of breath.  Weakness after normal activity.  The white part of the eye turns yellow (jaundice).  You have a decrease in the amount of urine or are urinating less often.  Your urine turns a dark color or changes to pink, red, or brown. Document Released: 07/23/2000 Document Revised: 10/18/2011 Document Reviewed: 03/11/2008 [facility] Patient Information 2014 [place], [place].  _______________________________________________________________________

## 2015-05-05 ENCOUNTER — Encounter (HOSPITAL_COMMUNITY)
Admission: RE | Admit: 2015-05-05 | Discharge: 2015-05-05 | Disposition: A | Discharge status: Home / Self Care | Payer: Medicare Other | Source: Ambulatory Visit | Attending: Orthopedic Surgery | Admitting: Orthopedic Surgery

## 2015-05-05 ENCOUNTER — Encounter (HOSPITAL_COMMUNITY): Payer: Self-pay

## 2015-05-05 DIAGNOSIS — M179 Osteoarthritis of knee, unspecified: Secondary | ICD-10-CM | POA: Insufficient documentation

## 2015-05-05 DIAGNOSIS — Z01818 Encounter for other preprocedural examination: Secondary | ICD-10-CM | POA: Diagnosis not present

## 2015-05-05 LAB — PROTIME-INR
INR: 0.94 (ref 0.00–1.49)
Prothrombin Time: 12.8 seconds (ref 11.6–15.2)

## 2015-05-05 LAB — COMPREHENSIVE METABOLIC PANEL
ALT: 14 U/L (ref 14–54)
AST: 28 U/L (ref 15–41)
Albumin: 4.1 g/dL (ref 3.5–5.0)
Alkaline Phosphatase: 76 U/L (ref 38–126)
Anion gap: 5 (ref 5–15)
BUN: 23 mg/dL — ABNORMAL HIGH (ref 6–20)
CO2: 31 mmol/L (ref 22–32)
Calcium: 9.9 mg/dL (ref 8.9–10.3)
Chloride: 105 mmol/L (ref 101–111)
Creatinine, Ser: 0.87 mg/dL (ref 0.44–1.00)
GFR calc Af Amer: >60 mL/min (ref >60)
GFR calc non Af Amer: >60 mL/min (ref >60)
Glucose, Bld: 95 mg/dL (ref 65–99)
Potassium: 3.8 mmol/L (ref 3.5–5.1)
Sodium: 141 mmol/L (ref 135–145)
Total Bilirubin: 0.5 mg/dL (ref 0.3–1.2)
Total Protein: 7.1 g/dL (ref 6.5–8.1)

## 2015-05-05 LAB — URINE MICROSCOPIC-ADD ON

## 2015-05-05 LAB — URINALYSIS, ROUTINE W REFLEX MICROSCOPIC
Bilirubin Urine: NEGATIVE
Glucose, UA: NEGATIVE mg/dL
Hgb urine dipstick: NEGATIVE
Ketones, ur: NEGATIVE mg/dL
Leukocytes, UA: NEGATIVE
Nitrite: NEGATIVE
Protein, ur: NEGATIVE mg/dL
Specific Gravity, Urine: 1.014 (ref 1.005–1.030)
Urobilinogen, UA: 0.2 mg/dL (ref 0.0–1.0)
pH: 7.5 (ref 5.0–8.0)

## 2015-05-05 LAB — SURGICAL PCR SCREEN
MRSA, PCR: NEGATIVE
Staphylococcus aureus: POSITIVE — AB

## 2015-05-05 LAB — CBC
HCT: 41.8 % (ref 36.0–46.0)
Hemoglobin: 13.5 g/dL (ref 12.0–15.0)
MCH: 27.2 pg (ref 26.0–34.0)
MCHC: 32.3 g/dL (ref 30.0–36.0)
MCV: 84.1 fL (ref 78.0–100.0)
Platelets: 217 10*3/uL (ref 150–400)
RBC: 4.97 MIL/uL (ref 3.87–5.11)
RDW: 14.1 % (ref 11.5–15.5)
WBC: 5.1 10*3/uL (ref 4.0–10.5)

## 2015-05-05 LAB — ABO/RH: ABO/RH(D): A POS

## 2015-05-05 LAB — APTT: aPTT: 32 seconds (ref 24–37)

## 2015-05-05 NOTE — Progress Notes (Signed)
04-02-15 - LOV - Dr. Prescott Gum (cardiothorac) - EPIC 04-02-15 - 2V CXR - EPIC 03-27-15 - EKG - in chart 03-27-15 - Surgical clearance - Dr. Shelia Media - in chart 03-06-15 - LOV - Dr. Lake Bells (pulm) - EPIC 01-14-15 - EKG - EPIC  01-10-14 - Pulm.Function Test - EPIC

## 2015-05-05 NOTE — Progress Notes (Signed)
05-05-15 - PCR Screen results from preop appt. On 05-05-15 faxed to Dr. Wynelle Link via [facility]

## 2015-05-05 NOTE — Progress Notes (Signed)
05-05-15 - UA and Micro lab results from preop appt. On 05-05-15 faxed to Dr. Wynelle Link via [facility]

## 2015-05-06 ENCOUNTER — Ambulatory Visit: Payer: Self-pay | Admitting: Orthopedic Surgery

## 2015-05-06 NOTE — H&P (Signed)
Tina Bell DOB: May 24, 1936 Married / Language: English / Race: White Female Date of Admission:  05/12/2015 CC:  Left Knee Pain History of Present Illness The patient is a 79 year old female who comes in for a preoperative History and Physical. The patient is scheduled for a left total knee arthroplasty to be performed by Dr. Dione Plover. Aluisio, MD at [facility] on 05-12-2015. The patient is a 79 year old female who presented with knee complaints. The patient was seen for a second opinion. The patient reports left knee (worse than right) symptoms including: pain, swelling and difficulty ambulating down stairs which began 10 year(s) ago without any known injury.The patient feels that the symptoms are worsening. The patient has the current diagnosis of knee osteoarthritis. Prior to being seen, the patient was previously evaluated by a colleague. Previous work-up for this problem has included knee x-rays and arthroscopy (on the right knee in 1999). Past treatment for this problem has included intra-articular injection of corticosteroids (finished series of Supartz 04/24/14), nonsteroidal anti-inflammatory drugs and physical therapy (after her right knee scope; has not had any therapy recently). Current treatment includes nonsteroidal anti-inflammatory drugs (celebrex). The left knee definitely hurts more than the right, but both are very painful for her. She said that the pain itself sometimes could be tolerated, but the lack of function is what bothers her the most. She has given up playing tennis because of her knees. She is not getting around anywhere near as well as she would like. She has had cortisone and viscoupplement injections in the past with viscosupplements most recently being in September of 2015 utilizing Supartz at Dr. Archie Endo office. Unfortunately, it did not help. The left knee is what bothers her the most and she is ready to proceed with surgery. They have been treated  conservatively in the past for the above stated problem and despite conservative measures, they continue to have progressive pain and severe functional limitations and dysfunction. They have failed non-operative management including home exercise, medications, and injections. It is felt that they would benefit from undergoing total joint replacement. Risks and benefits of the procedure have been discussed with the patient and they elect to proceed with surgery. There are no active contraindications to surgery such as ongoing infection or rapidly progressive neurological disease.  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 Keflex *CEPHALOSPORINS* Hives.  Family History Rheumatoid Arthritis grandfather mothers side Cancer mother and grandmother mothers side Heart disease in female family member before age 25 Heart disease in female family member before age 33  Social History Tobacco / smoke exposure no Pain Contract no Illicit drug use no Tobacco use former smoker; smoke(d) less than 1/2 pack(s) per day Number of flights of stairs before winded 2-3 Marital status married Living situation live with spouse Current work status retired Engineer, agricultural (Currently) no Drug/Alcohol Rehab (Previously) no Children 2 Alcohol use current drinker; drinks wine and hard liquor; only occasionally per week Exercise Exercises daily; does running / walking, individual sport and gym / weights Post-Surgical Plans Home versus Inpatient Rehab at [facility]  Medication History Calcium+D3 Gradual Release  (600-40-500MG -MG-UNIT Tablet ER 24HR, Oral) Active. PriLOSEC (Oral) Specific dose unknown - Active. Mucinex (Oral) Specific dose unknown - Active. Claritin (Oral) Specific  dose unknown - Active. Krill Oil (Oral) Active. CeleBREX (200MG  Capsule, Oral) Active.  Past Surgical History  Tubal Ligation Straighten Nasal Septum Arthroscopy of Knee right Breast Mass; Local Excision right Dilation and Curettage of Uterus - Multiple  Review of Systems General Not Present- Chills, Fatigue, Fever, Memory Loss, Night Sweats, Weight Gain and Weight Loss. Skin Not Present- Eczema, Hives, Itching, Lesions and Rash. HEENT Not Present- Dentures, Double Vision, Headache, Hearing Loss, Tinnitus and Visual Loss. Respiratory Not Present- Allergies, Chronic Cough, Coughing up blood, Shortness of breath at rest and Shortness of breath with exertion. Cardiovascular Not Present- Chest Pain, Difficulty Breathing Lying Down, Murmur, Palpitations, Racing/skipping heartbeats and Swelling. Gastrointestinal Not Present- Abdominal Pain, Bloody Stool, Constipation, Diarrhea, Difficulty Swallowing, Heartburn, Jaundice, Loss of appetitie, Nausea and Vomiting. Female Genitourinary Not Present- Blood in Urine, Discharge, Flank Pain, Incontinence, Painful Urination, Urgency, Urinary frequency, Urinary Retention, Urinating at Night and Weak urinary stream. Musculoskeletal Present- Joint Pain and Joint Swelling. Not Present- Back Pain, Morning Stiffness, Muscle Pain, Muscle Weakness and Spasms. Neurological Not Present- Blackout spells, Difficulty with balance, Dizziness, Paralysis, Tremor and Weakness. Psychiatric Not Present- Insomnia.  Vitals Weight: 121 lb Height: 63in Weight was reported by patient. Height was reported by patient. Body Surface Area: 1.56 m Body Mass Index: 21.43 kg/m  BP: 138/78 (Sitting, Right Arm, Standard)  Physical Exam General Mental Status -Alert, cooperative and good  historian. General Appearance-pleasant, Not in acute distress. Orientation-Oriented X3. Build & Nutrition-Petite, Well nourished and Well developed.  Head and Neck Head-normocephalic, atraumatic . Neck Global Assessment - supple, no bruit auscultated on the right, no bruit auscultated on the left.  Eye Pupil - Bilateral-Regular and Round. Motion - Bilateral-EOMI.  Chest and Lung Exam Auscultation Breath sounds - clear at anterior chest wall and clear at posterior chest wall. Adventitious sounds - No Adventitious sounds.  Cardiovascular Auscultation Rhythm - Regular rate and rhythm. Heart Sounds - S1 WNL and S2 WNL. Murmurs & Other Heart Sounds - Auscultation of the heart reveals - No Murmurs.  Abdomen Palpation/Percussion Tenderness - Abdomen is non-tender to palpation. Rigidity (guarding) - Abdomen is soft. Auscultation Auscultation of the abdomen reveals - Bowel sounds normal.  Female Genitourinary Note: Not done, not pertinent to present illness  Musculoskeletal Note: On exam, well-developed female alert and oriented and no apparent distress. Both hips show normal range of motion with no discomfort. The left knee shows no effusion. There is moderate crepitus on range of motion of the left knee. She has some slight tenderness medial greater than lateral with no instability noted. The right knee has no effusion. Range is about 0 to 135, moderate crepitus on range of motion, and no tenderness or instability. Pulses, sensation, and motor are intact in both lower extremities.  RADIOGRAPHS: AP of both knees and lateral show significant patellofemoral arthritis of the left worse than right knee. She has it on both, there is more bony erosion on the left and the right. She also has some medial osteophytes.  Assessment & Plan Primary osteoarthritis of left knee (M17.12) Primary osteoarthritis of right knee (M17.11) Note:Surgical Plans: Left Total Knee  Replacement  Disposition: Home versus Rehab at [facility]  PCP: Dr. Shelia Media  IV TXA  Anesthesia Issues: "Sometimes problem waking up after the surgery"  Signed electronically by Sorren Vallier Monika Salk, III PA-C

## 2015-05-12 ENCOUNTER — Inpatient Hospital Stay (HOSPITAL_COMMUNITY)
Admission: RE | Admit: 2015-05-12 | Discharge: 2015-05-14 | DRG: 470 | Disposition: A | Discharge status: Skilled Nursing Facility | Payer: Medicare Other | Source: Ambulatory Visit | Attending: Orthopedic Surgery | Admitting: Orthopedic Surgery

## 2015-05-12 ENCOUNTER — Encounter (HOSPITAL_COMMUNITY): Payer: Self-pay | Admitting: *Deleted

## 2015-05-12 ENCOUNTER — Encounter (HOSPITAL_COMMUNITY)
Admission: RE | Disposition: A | Discharge status: Skilled Nursing Facility | Payer: Self-pay | Source: Ambulatory Visit | Attending: Orthopedic Surgery

## 2015-05-12 ENCOUNTER — Inpatient Hospital Stay (HOSPITAL_COMMUNITY): Payer: Medicare Other | Admitting: Anesthesiology

## 2015-05-12 DIAGNOSIS — Z87891 Personal history of nicotine dependence: Secondary | ICD-10-CM

## 2015-05-12 DIAGNOSIS — Z79899 Other long term (current) drug therapy: Secondary | ICD-10-CM

## 2015-05-12 DIAGNOSIS — M1710 Unilateral primary osteoarthritis, unspecified knee: Secondary | ICD-10-CM | POA: Diagnosis present

## 2015-05-12 DIAGNOSIS — Z8261 Family history of arthritis: Secondary | ICD-10-CM | POA: Diagnosis not present

## 2015-05-12 DIAGNOSIS — Z01812 Encounter for preprocedural laboratory examination: Secondary | ICD-10-CM | POA: Diagnosis not present

## 2015-05-12 DIAGNOSIS — I712 Thoracic aortic aneurysm, without rupture: Secondary | ICD-10-CM | POA: Diagnosis present

## 2015-05-12 DIAGNOSIS — M8580 Other specified disorders of bone density and structure, unspecified site: Secondary | ICD-10-CM | POA: Diagnosis present

## 2015-05-12 DIAGNOSIS — M25562 Pain in left knee: Secondary | ICD-10-CM | POA: Diagnosis present

## 2015-05-12 DIAGNOSIS — M170 Bilateral primary osteoarthritis of knee: Principal | ICD-10-CM | POA: Diagnosis present

## 2015-05-12 DIAGNOSIS — M1712 Unilateral primary osteoarthritis, left knee: Secondary | ICD-10-CM

## 2015-05-12 DIAGNOSIS — M179 Osteoarthritis of knee, unspecified: Secondary | ICD-10-CM | POA: Diagnosis present

## 2015-05-12 HISTORY — PX: TOTAL KNEE ARTHROPLASTY: SHX125

## 2015-05-12 LAB — TYPE AND SCREEN
ABO/RH(D): A POS
Antibody Screen: NEGATIVE

## 2015-05-12 SURGERY — ARTHROPLASTY, KNEE, TOTAL
Anesthesia: Spinal | Site: Knee | Laterality: Left

## 2015-05-12 MED ORDER — HYDROMORPHONE HCL 1 MG/ML IJ SOLN
0.5000 mg | INTRAMUSCULAR | Status: DC | PRN
  Administered 2015-05-12: 0.5 mg via INTRAVENOUS
  Filled 2015-05-12: qty 1

## 2015-05-12 MED ORDER — MENTHOL 3 MG MT LOZG: 1.0000 | LOZENGE | OROMUCOSAL | Status: DC | PRN

## 2015-05-12 MED ORDER — LACTATED RINGERS IV SOLN: INTRAVENOUS | Status: DC

## 2015-05-12 MED ORDER — METOCLOPRAMIDE HCL 5 MG/ML IJ SOLN: 5.0000 mg | Freq: Three times a day (TID) | INTRAMUSCULAR | Status: DC | PRN

## 2015-05-12 MED ORDER — SODIUM CHLORIDE 0.9 % IJ SOLN
INTRAMUSCULAR | Status: AC
  Filled 2015-05-12: qty 50

## 2015-05-12 MED ORDER — POLYETHYLENE GLYCOL 3350 17 G PO PACK: 17.0000 g | PACK | Freq: Every day | ORAL | Status: DC | PRN

## 2015-05-12 MED ORDER — BISACODYL 10 MG RE SUPP: 10.0000 mg | Freq: Every day | RECTAL | Status: DC | PRN

## 2015-05-12 MED ORDER — OXYCODONE HCL 5 MG PO TABS
5.0000 mg | ORAL_TABLET | ORAL | Status: DC | PRN
  Administered 2015-05-12: 5 mg via ORAL
  Administered 2015-05-12 – 2015-05-14 (×13): 10 mg via ORAL
  Filled 2015-05-12 (×8): qty 2
  Filled 2015-05-12: qty 1
  Filled 2015-05-12 (×5): qty 2

## 2015-05-12 MED ORDER — 0.9 % SODIUM CHLORIDE (POUR BTL) OPTIME
TOPICAL | Status: DC | PRN
  Administered 2015-05-12: 1000 mL

## 2015-05-12 MED ORDER — METHOCARBAMOL 500 MG PO TABS
500.0000 mg | ORAL_TABLET | Freq: Four times a day (QID) | ORAL | Status: DC | PRN
  Administered 2015-05-13 – 2015-05-14 (×2): 500 mg via ORAL
  Filled 2015-05-12 (×2): qty 1

## 2015-05-12 MED ORDER — MIDAZOLAM HCL 5 MG/5ML IJ SOLN
INTRAMUSCULAR | Status: DC | PRN
  Administered 2015-05-12: 0.5 mg via INTRAVENOUS

## 2015-05-12 MED ORDER — RIVAROXABAN 10 MG PO TABS
10.0000 mg | ORAL_TABLET | Freq: Every day | ORAL | Status: DC
  Administered 2015-05-13 – 2015-05-14 (×2): 10 mg via ORAL
  Filled 2015-05-12 (×3): qty 1

## 2015-05-12 MED ORDER — ONDANSETRON HCL 4 MG PO TABS: 4.0000 mg | ORAL_TABLET | Freq: Four times a day (QID) | ORAL | Status: DC | PRN

## 2015-05-12 MED ORDER — DEXAMETHASONE SODIUM PHOSPHATE 10 MG/ML IJ SOLN
INTRAMUSCULAR | Status: AC
  Filled 2015-05-12: qty 1

## 2015-05-12 MED ORDER — VANCOMYCIN HCL IN DEXTROSE 1-5 GM/200ML-% IV SOLN
1000.0000 mg | INTRAVENOUS | Status: AC
  Administered 2015-05-12: 1000 mg via INTRAVENOUS
  Filled 2015-05-12: qty 200

## 2015-05-12 MED ORDER — DEXAMETHASONE SODIUM PHOSPHATE 10 MG/ML IJ SOLN
10.0000 mg | Freq: Once | INTRAMUSCULAR | Status: AC
  Administered 2015-05-13: 10 mg via INTRAVENOUS
  Filled 2015-05-12: qty 1

## 2015-05-12 MED ORDER — ONDANSETRON HCL 4 MG/2ML IJ SOLN
INTRAMUSCULAR | Status: AC
Start: 2015-05-12 — End: 2015-05-12
  Filled 2015-05-12: qty 2

## 2015-05-12 MED ORDER — PHENOL 1.4 % MT LIQD
1.0000 | OROMUCOSAL | Status: DC | PRN
  Filled 2015-05-12: qty 177

## 2015-05-12 MED ORDER — BUPIVACAINE IN DEXTROSE 0.75-8.25 % IT SOLN
INTRATHECAL | Status: DC | PRN
  Administered 2015-05-12: 1.6 mL via INTRATHECAL

## 2015-05-12 MED ORDER — BUPIVACAINE HCL 0.25 % IJ SOLN
INTRAMUSCULAR | Status: DC | PRN
  Administered 2015-05-12: 20 mL

## 2015-05-12 MED ORDER — FENTANYL CITRATE (PF) 100 MCG/2ML IJ SOLN
INTRAMUSCULAR | Status: AC
  Filled 2015-05-12: qty 4

## 2015-05-12 MED ORDER — BUPIVACAINE LIPOSOME 1.3 % IJ SUSP
INTRAMUSCULAR | Status: DC | PRN
  Administered 2015-05-12: 20 mL

## 2015-05-12 MED ORDER — PROPOFOL 10 MG/ML IV BOLUS
INTRAVENOUS | Status: AC
  Filled 2015-05-12: qty 20

## 2015-05-12 MED ORDER — KETOROLAC TROMETHAMINE 15 MG/ML IJ SOLN: 7.5000 mg | Freq: Four times a day (QID) | INTRAMUSCULAR | Status: AC | PRN

## 2015-05-12 MED ORDER — SODIUM CHLORIDE 0.9 % IJ SOLN
INTRAMUSCULAR | Status: AC
  Filled 2015-05-12: qty 10

## 2015-05-12 MED ORDER — KCL IN DEXTROSE-NACL 20-5-0.9 MEQ/L-%-% IV SOLN
INTRAVENOUS | Status: DC
  Administered 2015-05-12 (×2): via INTRAVENOUS
  Filled 2015-05-12 (×3): qty 1000

## 2015-05-12 MED ORDER — ACETAMINOPHEN 500 MG PO TABS
1000.0000 mg | ORAL_TABLET | Freq: Four times a day (QID) | ORAL | Status: AC
  Administered 2015-05-12 – 2015-05-13 (×4): 1000 mg via ORAL
  Filled 2015-05-12 (×4): qty 2

## 2015-05-12 MED ORDER — SODIUM CHLORIDE 0.9 % IJ SOLN
INTRAMUSCULAR | Status: DC | PRN
  Administered 2015-05-12: 50 mL

## 2015-05-12 MED ORDER — EPHEDRINE SULFATE 50 MG/ML IJ SOLN
INTRAMUSCULAR | Status: AC
  Filled 2015-05-12: qty 1

## 2015-05-12 MED ORDER — GUAIFENESIN ER 600 MG PO TB12: 1200.0000 mg | ORAL_TABLET | Freq: Two times a day (BID) | ORAL | Status: DC | PRN

## 2015-05-12 MED ORDER — SODIUM CHLORIDE 0.9 % IV SOLN: INTRAVENOUS | Status: DC

## 2015-05-12 MED ORDER — METOCLOPRAMIDE HCL 10 MG PO TABS: 5.0000 mg | ORAL_TABLET | Freq: Three times a day (TID) | ORAL | Status: DC | PRN

## 2015-05-12 MED ORDER — CHLORHEXIDINE GLUCONATE 4 % EX LIQD: 60.0000 mL | Freq: Once | CUTANEOUS | Status: DC

## 2015-05-12 MED ORDER — TRAMADOL HCL 50 MG PO TABS: 50.0000 mg | ORAL_TABLET | Freq: Four times a day (QID) | ORAL | Status: DC | PRN

## 2015-05-12 MED ORDER — FLEET ENEMA 7-19 GM/118ML RE ENEM: 1.0000 | ENEMA | Freq: Once | RECTAL | Status: DC | PRN

## 2015-05-12 MED ORDER — LACTATED RINGERS IV SOLN
INTRAVENOUS | Status: DC | PRN
  Administered 2015-05-12 (×2): via INTRAVENOUS

## 2015-05-12 MED ORDER — SODIUM CHLORIDE 0.9 % IR SOLN
Status: DC | PRN
  Administered 2015-05-12: 1000 mL

## 2015-05-12 MED ORDER — DIPHENHYDRAMINE HCL 12.5 MG/5ML PO ELIX: 12.5000 mg | ORAL_SOLUTION | ORAL | Status: DC | PRN

## 2015-05-12 MED ORDER — LIDOCAINE HCL (CARDIAC) 20 MG/ML IV SOLN
INTRAVENOUS | Status: AC
Start: 2015-05-12 — End: 2015-05-12
  Filled 2015-05-12: qty 5

## 2015-05-12 MED ORDER — EPHEDRINE SULFATE 50 MG/ML IJ SOLN
INTRAMUSCULAR | Status: DC | PRN
  Administered 2015-05-12 (×3): 5 mg via INTRAVENOUS

## 2015-05-12 MED ORDER — TRANEXAMIC ACID 1000 MG/10ML IV SOLN
1000.0000 mg | INTRAVENOUS | Status: AC
  Administered 2015-05-12: 1000 mg via INTRAVENOUS
  Filled 2015-05-12: qty 10

## 2015-05-12 MED ORDER — ACETAMINOPHEN 10 MG/ML IV SOLN
1000.0000 mg | Freq: Once | INTRAVENOUS | Status: AC
  Administered 2015-05-12: 1000 mg via INTRAVENOUS
  Filled 2015-05-12: qty 100

## 2015-05-12 MED ORDER — ACETAMINOPHEN 325 MG PO TABS: 650.0000 mg | ORAL_TABLET | Freq: Four times a day (QID) | ORAL | Status: DC | PRN

## 2015-05-12 MED ORDER — PROPOFOL 500 MG/50ML IV EMUL
INTRAVENOUS | Status: DC | PRN
  Administered 2015-05-12: 100 ug/kg/min via INTRAVENOUS

## 2015-05-12 MED ORDER — DEXAMETHASONE SODIUM PHOSPHATE 10 MG/ML IJ SOLN
10.0000 mg | Freq: Once | INTRAMUSCULAR | Status: AC
  Administered 2015-05-12: 10 mg via INTRAVENOUS

## 2015-05-12 MED ORDER — ACETAMINOPHEN 10 MG/ML IV SOLN
INTRAVENOUS | Status: AC
  Filled 2015-05-12: qty 100

## 2015-05-12 MED ORDER — DOCUSATE SODIUM 100 MG PO CAPS
100.0000 mg | ORAL_CAPSULE | Freq: Two times a day (BID) | ORAL | Status: DC
  Administered 2015-05-12 – 2015-05-14 (×5): 100 mg via ORAL

## 2015-05-12 MED ORDER — ONDANSETRON HCL 4 MG/2ML IJ SOLN
INTRAMUSCULAR | Status: DC | PRN
  Administered 2015-05-12: 4 mg via INTRAVENOUS

## 2015-05-12 MED ORDER — FENTANYL CITRATE (PF) 100 MCG/2ML IJ SOLN: 25.0000 ug | INTRAMUSCULAR | Status: DC | PRN

## 2015-05-12 MED ORDER — ONDANSETRON HCL 4 MG/2ML IJ SOLN
4.0000 mg | Freq: Four times a day (QID) | INTRAMUSCULAR | Status: DC | PRN
  Administered 2015-05-12: 4 mg via INTRAVENOUS
  Filled 2015-05-12: qty 2

## 2015-05-12 MED ORDER — FENTANYL CITRATE (PF) 100 MCG/2ML IJ SOLN
INTRAMUSCULAR | Status: DC | PRN
  Administered 2015-05-12: 25 ug via INTRAVENOUS
  Administered 2015-05-12 (×2): 50 ug via INTRAVENOUS

## 2015-05-12 MED ORDER — VANCOMYCIN HCL IN DEXTROSE 1-5 GM/200ML-% IV SOLN
1000.0000 mg | Freq: Two times a day (BID) | INTRAVENOUS | Status: AC
  Administered 2015-05-12: 1000 mg via INTRAVENOUS
  Filled 2015-05-12: qty 200

## 2015-05-12 MED ORDER — ACETAMINOPHEN 650 MG RE SUPP: 650.0000 mg | Freq: Four times a day (QID) | RECTAL | Status: DC | PRN

## 2015-05-12 MED ORDER — MIDAZOLAM HCL 2 MG/2ML IJ SOLN
INTRAMUSCULAR | Status: AC
  Filled 2015-05-12: qty 4

## 2015-05-12 MED ORDER — BUPIVACAINE HCL (PF) 0.25 % IJ SOLN
INTRAMUSCULAR | Status: AC
  Filled 2015-05-12: qty 30

## 2015-05-12 MED ORDER — BUPIVACAINE LIPOSOME 1.3 % IJ SUSP
20.0000 mL | Freq: Once | INTRAMUSCULAR | Status: DC
  Filled 2015-05-12: qty 20

## 2015-05-12 MED ORDER — METHOCARBAMOL 1000 MG/10ML IJ SOLN
500.0000 mg | Freq: Four times a day (QID) | INTRAVENOUS | Status: DC | PRN
  Filled 2015-05-12: qty 5

## 2015-05-12 SURGICAL SUPPLY — 59 items
BAG DECANTER FOR FLEXI CONT (MISCELLANEOUS) IMPLANT
BAG ZIPLOCK 12X15 (MISCELLANEOUS) ×2 IMPLANT
BANDAGE ELASTIC 6 VELCRO ST LF (GAUZE/BANDAGES/DRESSINGS) ×2 IMPLANT
BANDAGE ESMARK 6X9 LF (GAUZE/BANDAGES/DRESSINGS) ×1 IMPLANT
BLADE SAG 18X100X1.27 (BLADE) ×2 IMPLANT
BLADE SAW SGTL 11.0X1.19X90.0M (BLADE) ×2 IMPLANT
BNDG ESMARK 6X9 LF (GAUZE/BANDAGES/DRESSINGS) ×2
BOWL SMART MIX CTS (DISPOSABLE) ×2 IMPLANT
CAP KNEE TOTAL 3 SIGMA ×2 IMPLANT
CEMENT HV SMART SET (Cement) ×4 IMPLANT
CUFF TOURN SGL QUICK 34 (TOURNIQUET CUFF) ×1
CUFF TRNQT CYL 34X4X40X1 (TOURNIQUET CUFF) ×1 IMPLANT
DECANTER SPIKE VIAL GLASS SM (MISCELLANEOUS) ×2 IMPLANT
DRAPE EXTREMITY T 121X128X90 (DRAPE) ×2 IMPLANT
DRAPE POUCH INSTRU U-SHP 10X18 (DRAPES) ×2 IMPLANT
DRAPE U-SHAPE 47X51 STRL (DRAPES) ×2 IMPLANT
DRSG ADAPTIC 3X8 NADH LF (GAUZE/BANDAGES/DRESSINGS) ×2 IMPLANT
DRSG PAD ABDOMINAL 8X10 ST (GAUZE/BANDAGES/DRESSINGS) ×2 IMPLANT
DURAPREP 26ML APPLICATOR (WOUND CARE) ×2 IMPLANT
ELECT REM PT RETURN 9FT ADLT (ELECTROSURGICAL) ×2
ELECTRODE REM PT RTRN 9FT ADLT (ELECTROSURGICAL) ×1 IMPLANT
EVACUATOR 1/8 PVC DRAIN (DRAIN) ×2 IMPLANT
FACESHIELD WRAPAROUND (MASK) ×10 IMPLANT
GAUZE SPONGE 4X4 12PLY STRL (GAUZE/BANDAGES/DRESSINGS) ×2 IMPLANT
GLOVE BIO SURGEON STRL SZ7.5 (GLOVE) IMPLANT
GLOVE BIO SURGEON STRL SZ8 (GLOVE) ×2 IMPLANT
GLOVE BIOGEL PI IND STRL 6.5 (GLOVE) IMPLANT
GLOVE BIOGEL PI IND STRL 8 (GLOVE) ×1 IMPLANT
GLOVE BIOGEL PI INDICATOR 6.5 (GLOVE)
GLOVE BIOGEL PI INDICATOR 8 (GLOVE) ×1
GLOVE SURG SS PI 6.5 STRL IVOR (GLOVE) IMPLANT
GOWN STRL REUS W/TWL LRG LVL3 (GOWN DISPOSABLE) ×2 IMPLANT
GOWN STRL REUS W/TWL XL LVL3 (GOWN DISPOSABLE) IMPLANT
HANDPIECE INTERPULSE COAX TIP (DISPOSABLE) ×1
IMMOBILIZER KNEE 20 (SOFTGOODS) ×2 IMPLANT
KIT BASIN OR (CUSTOM PROCEDURE TRAY) ×2 IMPLANT
MANIFOLD NEPTUNE II (INSTRUMENTS) ×2 IMPLANT
NDL SAFETY ECLIPSE 18X1.5 (NEEDLE) ×2 IMPLANT
NEEDLE HYPO 18GX1.5 SHARP (NEEDLE) ×2
NS IRRIG 1000ML POUR BTL (IV SOLUTION) ×2 IMPLANT
PACK TOTAL JOINT (CUSTOM PROCEDURE TRAY) ×2 IMPLANT
PADDING CAST COTTON 6X4 STRL (CAST SUPPLIES) ×2 IMPLANT
PEN SKIN MARKING BROAD (MISCELLANEOUS) ×2 IMPLANT
POSITIONER SURGICAL ARM (MISCELLANEOUS) ×2 IMPLANT
SET HNDPC FAN SPRY TIP SCT (DISPOSABLE) ×1 IMPLANT
STRIP CLOSURE SKIN 1/2X4 (GAUZE/BANDAGES/DRESSINGS) ×2 IMPLANT
SUCTION FRAZIER 12FR DISP (SUCTIONS) ×2 IMPLANT
SUT MNCRL AB 4-0 PS2 18 (SUTURE) ×2 IMPLANT
SUT VIC AB 2-0 CT1 27 (SUTURE) ×3
SUT VIC AB 2-0 CT1 TAPERPNT 27 (SUTURE) ×3 IMPLANT
SUT VLOC 180 0 24IN GS25 (SUTURE) ×2 IMPLANT
SYR 20CC LL (SYRINGE) ×2 IMPLANT
SYR 50ML LL SCALE MARK (SYRINGE) ×2 IMPLANT
TOWEL OR 17X26 10 PK STRL BLUE (TOWEL DISPOSABLE) ×2 IMPLANT
TOWEL OR NON WOVEN STRL DISP B (DISPOSABLE) IMPLANT
TRAY FOLEY W/METER SILVER 14FR (SET/KITS/TRAYS/PACK) ×2 IMPLANT
WATER STERILE IRR 1500ML POUR (IV SOLUTION) ×2 IMPLANT
WRAP KNEE MAXI GEL POST OP (GAUZE/BANDAGES/DRESSINGS) ×2 IMPLANT
YANKAUER SUCT BULB TIP 10FT TU (MISCELLANEOUS) ×2 IMPLANT

## 2015-05-12 NOTE — H&P (View-Only) (Signed)
Tina Bell DOB: 05-31-1936 Married / Language: English / Race: White Female Date of Admission:  05/12/2015 CC:  Left Knee Pain History of Present Illness The patient is a 79 year old female who comes in for a preoperative History and Physical. The patient is scheduled for a left total knee arthroplasty to be performed by Dr. Dione Plover. Aluisio, MD at [facility] on 05-12-2015. The patient is a 79 year old female who presented with knee complaints. The patient was seen for a second opinion. The patient reports left knee (worse than right) symptoms including: pain, swelling and difficulty ambulating down stairs which began 10 year(s) ago without any known injury.The patient feels that the symptoms are worsening. The patient has the current diagnosis of knee osteoarthritis. Prior to being seen, the patient was previously evaluated by a colleague. Previous work-up for this problem has included knee x-rays and arthroscopy (on the right knee in 1999). Past treatment for this problem has included intra-articular injection of corticosteroids (finished series of Supartz 04/24/14), nonsteroidal anti-inflammatory drugs and physical therapy (after her right knee scope; has not had any therapy recently). Current treatment includes nonsteroidal anti-inflammatory drugs (celebrex). The left knee definitely hurts more than the right, but both are very painful for her. She said that the pain itself sometimes could be tolerated, but the lack of function is what bothers her the most. She has given up playing tennis because of her knees. She is not getting around anywhere near as well as she would like. She has had cortisone and viscoupplement injections in the past with viscosupplements most recently being in September of 2015 utilizing Supartz at Dr. Archie Endo office. Unfortunately, it did not help. The left knee is what bothers her the most and she is ready to proceed with surgery. They have been treated  conservatively in the past for the above stated problem and despite conservative measures, they continue to have progressive pain and severe functional limitations and dysfunction. They have failed non-operative management including home exercise, medications, and injections. It is felt that they would benefit from undergoing total joint replacement. Risks and benefits of the procedure have been discussed with the patient and they elect to proceed with surgery. There are no active contraindications to surgery such as ongoing infection or rapidly progressive neurological disease.  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 Keflex *CEPHALOSPORINS* Hives.  Family History Rheumatoid Arthritis grandfather mothers side Cancer mother and grandmother mothers side Heart disease in female family member before age 67 Heart disease in female family member before age 51  Social History Tobacco / smoke exposure no Pain Contract no Illicit drug use no Tobacco use former smoker; smoke(d) less than 1/2 pack(s) per day Number of flights of stairs before winded 2-3 Marital status married Living situation live with spouse Current work status retired Engineer, agricultural (Currently) no Drug/Alcohol Rehab (Previously) no Children 2 Alcohol use current drinker; drinks wine and hard liquor; only occasionally per week Exercise Exercises daily; does running / walking, individual sport and gym / weights Post-Surgical Plans Home versus Inpatient Rehab at [facility]  Medication History Calcium+D3 Gradual Release  (600-40-500MG -MG-UNIT Tablet ER 24HR, Oral) Active. PriLOSEC (Oral) Specific dose unknown - Active. Mucinex (Oral) Specific dose unknown - Active. Claritin (Oral) Specific  dose unknown - Active. Krill Oil (Oral) Active. CeleBREX (200MG  Capsule, Oral) Active.  Past Surgical History  Tubal Ligation Straighten Nasal Septum Arthroscopy of Knee right Breast Mass; Local Excision right Dilation and Curettage of Uterus - Multiple  Review of Systems General Not Present- Chills, Fatigue, Fever, Memory Loss, Night Sweats, Weight Gain and Weight Loss. Skin Not Present- Eczema, Hives, Itching, Lesions and Rash. HEENT Not Present- Dentures, Double Vision, Headache, Hearing Loss, Tinnitus and Visual Loss. Respiratory Not Present- Allergies, Chronic Cough, Coughing up blood, Shortness of breath at rest and Shortness of breath with exertion. Cardiovascular Not Present- Chest Pain, Difficulty Breathing Lying Down, Murmur, Palpitations, Racing/skipping heartbeats and Swelling. Gastrointestinal Not Present- Abdominal Pain, Bloody Stool, Constipation, Diarrhea, Difficulty Swallowing, Heartburn, Jaundice, Loss of appetitie, Nausea and Vomiting. Female Genitourinary Not Present- Blood in Urine, Discharge, Flank Pain, Incontinence, Painful Urination, Urgency, Urinary frequency, Urinary Retention, Urinating at Night and Weak urinary stream. Musculoskeletal Present- Joint Pain and Joint Swelling. Not Present- Back Pain, Morning Stiffness, Muscle Pain, Muscle Weakness and Spasms. Neurological Not Present- Blackout spells, Difficulty with balance, Dizziness, Paralysis, Tremor and Weakness. Psychiatric Not Present- Insomnia.  Vitals Weight: 121 lb Height: 63in Weight was reported by patient. Height was reported by patient. Body Surface Area: 1.56 m Body Mass Index: 21.43 kg/m  BP: 138/78 (Sitting, Right Arm, Standard)  Physical Exam General Mental Status -Alert, cooperative and good  historian. General Appearance-pleasant, Not in acute distress. Orientation-Oriented X3. Build & Nutrition-Petite, Well nourished and Well developed.  Head and Neck Head-normocephalic, atraumatic . Neck Global Assessment - supple, no bruit auscultated on the right, no bruit auscultated on the left.  Eye Pupil - Bilateral-Regular and Round. Motion - Bilateral-EOMI.  Chest and Lung Exam Auscultation Breath sounds - clear at anterior chest wall and clear at posterior chest wall. Adventitious sounds - No Adventitious sounds.  Cardiovascular Auscultation Rhythm - Regular rate and rhythm. Heart Sounds - S1 WNL and S2 WNL. Murmurs & Other Heart Sounds - Auscultation of the heart reveals - No Murmurs.  Abdomen Palpation/Percussion Tenderness - Abdomen is non-tender to palpation. Rigidity (guarding) - Abdomen is soft. Auscultation Auscultation of the abdomen reveals - Bowel sounds normal.  Female Genitourinary Note: Not done, not pertinent to present illness  Musculoskeletal Note: On exam, well-developed female alert and oriented and no apparent distress. Both hips show normal range of motion with no discomfort. The left knee shows no effusion. There is moderate crepitus on range of motion of the left knee. She has some slight tenderness medial greater than lateral with no instability noted. The right knee has no effusion. Range is about 0 to 135, moderate crepitus on range of motion, and no tenderness or instability. Pulses, sensation, and motor are intact in both lower extremities.  RADIOGRAPHS: AP of both knees and lateral show significant patellofemoral arthritis of the left worse than right knee. She has it on both, there is more bony erosion on the left and the right. She also has some medial osteophytes.  Assessment & Plan Primary osteoarthritis of left knee (M17.12) Primary osteoarthritis of right knee (M17.11) Note:Surgical Plans: Left Total Knee  Replacement  Disposition: Home versus Rehab at [facility]  PCP: Dr. Shelia Media  IV TXA  Anesthesia Issues: "Sometimes problem waking up after the surgery"  Signed electronically by Cataleah Stites Monika Salk, III PA-C

## 2015-05-12 NOTE — Anesthesia Preprocedure Evaluation (Signed)
Anesthesia Evaluation  Patient identified by MRN, date of birth, ID band Patient awake    Reviewed: Allergy & Precautions, NPO status   Airway Mallampati: II  TM Distance: >3 FB Neck ROM: Full    Dental   Pulmonary pneumonia, former smoker,    breath sounds clear to auscultation       Cardiovascular negative cardio ROS   Rhythm:Regular Rate:Normal     Neuro/Psych    GI/Hepatic negative GI ROS, Neg liver ROS,   Endo/Other  negative endocrine ROS  Renal/GU negative Renal ROS     Musculoskeletal  (+) Arthritis ,   Abdominal   Peds  Hematology   Anesthesia Other Findings   Reproductive/Obstetrics                             Anesthesia Physical Anesthesia Plan  ASA: II  Anesthesia Plan: Spinal   Post-op Pain Management:    Induction:   Airway Management Planned: Simple Face Mask  Additional Equipment:   Intra-op Plan:   Post-operative Plan:   Informed Consent: I have reviewed the patients History and Physical, chart, labs and discussed the procedure including the risks, benefits and alternatives for the proposed anesthesia with the patient or authorized representative who has indicated his/her understanding and acceptance.   Dental advisory given  Plan Discussed with: CRNA and Anesthesiologist  Anesthesia Plan Comments:         Anesthesia Quick Evaluation

## 2015-05-12 NOTE — Interval H&P Note (Signed)
History and Physical Interval Note:  05/12/2015 6:48 AM  Tina Bell  has presented today for surgery, with the diagnosis of OA LEFT KNEE   The various methods of treatment have been discussed with the patient and family. After consideration of risks, benefits and other options for treatment, the patient has consented to  Procedure(s): LEFT TOTAL KNEE ARTHROPLASTY (Left) as a surgical intervention .  The patient's history has been reviewed, patient examined, no change in status, stable for surgery.  I have reviewed the patient's chart and labs.  Questions were answered to the patient's satisfaction.     Gearlean Alf

## 2015-05-12 NOTE — Evaluation (Signed)
Physical Therapy Evaluation Patient Details Name: Tina Bell MRN: [id number] DOB: Aug 21, 1935 Today's Date: 05/12/2015   History of Present Illness  s/p L TKA  Clinical Impression  Pt admitted with above diagnosis. Pt currently with functional limitations due to the deficits listed below (see PT Problem List).  Pt will benefit from skilled PT to increase their independence and safety with mobility to allow discharge to the venue listed below.  Pt planning for rehab/[facility]     Follow Up Recommendations SNF (pt planning for [facility])    Equipment Recommendations  Rolling walker with 5" wheels    Recommendations for Other Services       Precautions / Restrictions Precautions Precautions: Knee Required Braces or Orthoses: Knee Immobilizer - Left Knee Immobilizer - Left: Discontinue once straight leg raise with < 10 degree lag Restrictions Other Position/Activity Restrictions: WBAT      Mobility  Bed Mobility Overal bed mobility: Needs Assistance Bed Mobility: Supine to Sit     Supine to sit: Min assist;Mod assist     General bed mobility comments: incr time, cues for technique  Transfers Overall transfer level: Needs assistance Equipment used: Rolling walker (2 wheeled) Transfers: Sit to/from Stand Sit to Stand: Min assist         General transfer comment: cues for hand placement, safety  Ambulation/Gait Ambulation/Gait assistance: Min assist Ambulation Distance (Feet): 34 Feet Assistive device: Rolling walker (2 wheeled) Gait Pattern/deviations: Step-to pattern;Antalgic     General Gait Details: cues for sequence, posture, heavy use of UEs  Stairs            Wheelchair Mobility    Modified Rankin (Stroke Patients Only)       Balance                                             Pertinent Vitals/Pain Pain Assessment: 0-10 Pain Score: 2  Pain Location: L knee Pain Descriptors / Indicators: Sore Pain Intervention(s):  Limited activity within patient's tolerance;Monitored during session;Premedicated before session;Repositioned;Ice applied    Home Living Family/patient expects to be discharged to:: [place] (resides at  [place], plans to go to rehab at [facility]) Living Arrangements: Spouse/significant other                    Prior Function Level of Independence: Independent               Hand Dominance        Extremity/Trunk Assessment   Upper Extremity Assessment: Defer to OT evaluation           Lower Extremity Assessment: LLE deficits/detail   LLE Deficits / Details: ankle WFL; knee extension with hip flexion 2 to 2+/5     Communication   Communication: No difficulties  Cognition Arousal/Alertness: Awake/alert Behavior During Therapy: WFL for tasks assessed/performed Overall Cognitive Status: Within Functional Limits for tasks assessed                      General Comments      Exercises Total Joint Exercises Ankle Circles/Pumps: AROM;10 reps;Both      Assessment/Plan    PT Assessment Patient needs continued PT services  PT Diagnosis Difficulty walking   PT Problem List Decreased strength;Decreased range of motion;Decreased activity tolerance;Decreased mobility  PT Treatment Interventions DME instruction;Gait training;Functional mobility training;Therapeutic activities;Patient/family education;Therapeutic exercise  PT Goals (Current goals can be found in the Care Plan section) Acute Rehab PT Goals Patient Stated Goal: return to active lifestyle PT Goal Formulation: With patient Time For Goal Achievement: 05/19/15 Potential to Achieve Goals: Good    Frequency 7X/week   Barriers to discharge        Co-evaluation               End of Session Equipment Utilized During Treatment: Gait belt;Left knee immobilizer Activity Tolerance: Patient tolerated treatment well Patient left: in chair;with call bell/phone within  reach;with family/visitor present Nurse Communication: Mobility status         Time: 1416-1450 PT Time Calculation (min) (ACUTE ONLY): 34 min   Charges:   PT Evaluation $Initial PT Evaluation Tier I: 1 Procedure PT Treatments $Gait Training: 8-22 mins   PT G Codes:        Tina Bell 06-06-2015, 2:52 PM

## 2015-05-12 NOTE — Anesthesia Postprocedure Evaluation (Signed)
  Anesthesia Post-op Note  Patient: Tina Bell  Procedure(s) Performed: Procedure(s): LEFT TOTAL KNEE ARTHROPLASTY (Left)  Patient Location: PACU  Anesthesia Type:Spinal  Level of Consciousness: awake  Airway and Oxygen Therapy: Patient Spontanous Breathing  Post-op Pain: mild  Post-op Assessment: Post-op Vital signs reviewed         L Sensory Level: S1-Sole of foot, small toes R Sensory Level: L4-Anterior knee, lower leg  Post-op Vital Signs: Reviewed  Last Vitals:  Filed Vitals:   05/12/15 0941  BP: 138/78  Pulse: 74  Temp: 36.4 C  Resp: 12    Complications: No apparent anesthesia complications

## 2015-05-12 NOTE — Op Note (Signed)
Pre-operative diagnosis- Osteoarthritis  Left knee(s)  Post-operative diagnosis- Osteoarthritis Left knee(s)  Procedure-  Left  Total Knee Arthroplasty  Surgeon- Dione Plover. Tyshia Fenter, MD  Assistant- Ardeen Jourdain PA-C   Anesthesia-  Spinal  EBL-* No blood loss amount entered *   Drains Hemovac  Tourniquet time-  Total Tourniquet Time Documented: Thigh (Left) - 34 minutes Total: Thigh (Left) - 34 minutes     Complications- None  Condition-PACU - hemodynamically stable.   Brief Clinical Note  Tina Bell is a 79 y.o. year old female with end stage OA of her left knee with progressively worsening pain and dysfunction. She has constant pain, with activity and at rest and significant functional deficits with difficulties even with ADLs. She has had extensive non-op management including analgesics, injections of cortisone and viscosupplements, and home exercise program, but remains in significant pain with significant dysfunction. Radiographs show bone on bone arthritis patellofemoral with erosion of patella and trochlea. She presents now for left Total Knee Arthroplasty.    Procedure in detail---   The patient is brought into the operating room and positioned supine on the operating table. After successful administration of  Spinal,   a tourniquet is placed high on the  Left thigh(s) and the lower extremity is prepped and draped in the usual sterile fashion. Time out is performed by the operating team and then the  Left lower extremity is wrapped in Esmarch, knee flexed and the tourniquet inflated to 300 mmHg.       A midline incision is made with a ten blade through the subcutaneous tissue to the level of the extensor mechanism. A fresh blade is used to make a medial parapatellar arthrotomy. Soft tissue over the proximal medial tibia is subperiosteally elevated to the joint line with a knife and into the semimembranosus bursa with a Cobb elevator. Soft tissue over the proximal lateral  tibia is elevated with attention being paid to avoiding the patellar tendon on the tibial tubercle. The patella is everted, knee flexed 90 degrees and the ACL and PCL are removed. Findings are bone on bone patellofemoral with bony erosions of patella and trochlea.        The drill is used to create a starting hole in the distal femur and the canal is thoroughly irrigated with sterile saline to remove the fatty contents. The 5 degree Left  valgus alignment guide is placed into the femoral canal and the distal femoral cutting block is pinned to remove 10 mm off the distal femur. Resection is made with an oscillating saw.      The tibia is subluxed forward and the menisci are removed. The extramedullary alignment guide is placed referencing proximally at the medial aspect of the tibial tubercle and distally along the second metatarsal axis and tibial crest. The block is pinned to remove 46mm off the more deficient medial  side. Resection is made with an oscillating saw. Size 2.5is the most appropriate size for the tibia and the proximal tibia is prepared with the modular drill and keel punch for that size.      The femoral sizing guide is placed and size 2.5 is most appropriate. Rotation is marked off the epicondylar axis and confirmed by creating a rectangular flexion gap at 90 degrees. The size 2.5 cutting block is pinned in this rotation and the anterior, posterior and chamfer cuts are made with the oscillating saw. The intercondylar block is then placed and that cut is made.  Trial size 2.5 tibial component, trial size 2.5 posterior stabilized femur and a 10  mm posterior stabilized rotating platform insert trial is placed. Full extension is achieved with excellent varus/valgus and anterior/posterior balance throughout full range of motion. The patella is everted and thickness measured to be 20  mm. Free hand resection is taken to 11 mm, a 32 template is placed, lug holes are drilled, trial patella is  placed, and it tracks normally. Osteophytes are removed off the posterior femur with the trial in place. All trials are removed and the cut bone surfaces prepared with pulsatile lavage. Cement is mixed and once ready for implantation, the size 2.5 tibial implant, size  2.5 posterior stabilized femoral component, and the size 32 patella are cemented in place and the patella is held with the clamp. The trial insert is placed and the knee held in full extension. The Exparel (20 ml mixed with 30 ml saline) and .25% Bupivicaine, are injected into the extensor mechanism, posterior capsule, medial and lateral gutters and subcutaneous tissues.  All extruded cement is removed and once the cement is hard the permanent 10 mm posterior stabilized rotating platform insert is placed into the tibial tray.      The wound is copiously irrigated with saline solution and the extensor mechanism closed over a hemovac drain with #1 V-loc suture. The tourniquet is released for a total tourniquet time of 34  minutes. Flexion against gravity is 140 degrees and the patella tracks normally. Subcutaneous tissue is closed with 2.0 vicryl and subcuticular with running 4.0 Monocryl. The incision is cleaned and dried and steri-strips and a bulky sterile dressing are applied. The limb is placed into a knee immobilizer and the patient is awakened and transported to recovery in stable condition.      Please note that a surgical assistant was a medical necessity for this procedure in order to perform it in a safe and expeditious manner. Surgical assistant was necessary to retract the ligaments and vital neurovascular structures to prevent injury to them and also necessary for proper positioning of the limb to allow for anatomic placement of the prosthesis.   Dione Plover Cynthis Purington, MD    05/12/2015, 8:10 AM

## 2015-05-12 NOTE — Transfer of Care (Signed)
Immediate Anesthesia Transfer of Care Note  Patient: Tina Bell  Procedure(s) Performed: Procedure(s): LEFT TOTAL KNEE ARTHROPLASTY (Left)  Patient Location: PACU  Anesthesia Type:Spinal  Level of Consciousness: awake, alert  and oriented  Airway & Oxygen Therapy: Patient Spontanous Breathing and Patient connected to face mask oxygen  Post-op Assessment: Report given to RN, Post -op Vital signs reviewed and stable and SAB level L1.  Post vital signs: Reviewed and stable  Last Vitals:  Filed Vitals:   05/12/15 0543  BP: 158/91  Pulse: 76  Temp: 36.3 C  Resp: 20    Complications: No apparent anesthesia complications

## 2015-05-12 NOTE — Anesthesia Procedure Notes (Signed)
Spinal Patient location during procedure: OR End time: 05/12/2015 7:12 AM Staffing Anesthesiologist: Finis Bud Resident/CRNA: Maxwell Caul Performed by: resident/CRNA  Preanesthetic Checklist Completed: patient identified, site marked, surgical consent, pre-op evaluation, timeout performed, IV checked, risks and benefits discussed and monitors and equipment checked Spinal Block Patient position: sitting Prep: Betadine Patient monitoring: heart rate, continuous pulse ox and blood pressure Injection technique: single-shot Needle Needle type: Sprotte  Needle gauge: 24 G Additional Notes Expiration date of kit checked and confirmed. Patient tolerated procedure well, without complications. Expiration date 10-06-2016. LOT # [id number].

## 2015-05-13 LAB — BASIC METABOLIC PANEL
Anion gap: 9 (ref 5–15)
BUN: 13 mg/dL (ref 6–20)
CO2: 22 mmol/L (ref 22–32)
Calcium: 8.7 mg/dL — ABNORMAL LOW (ref 8.9–10.3)
Chloride: 109 mmol/L (ref 101–111)
Creatinine, Ser: 0.64 mg/dL (ref 0.44–1.00)
GFR calc Af Amer: >60 mL/min (ref >60)
GFR calc non Af Amer: >60 mL/min (ref >60)
Glucose, Bld: 166 mg/dL — ABNORMAL HIGH (ref 65–99)
Potassium: 4.4 mmol/L (ref 3.5–5.1)
Sodium: 140 mmol/L (ref 135–145)

## 2015-05-13 LAB — CBC
HCT: 36.2 % (ref 36.0–46.0)
Hemoglobin: 12 g/dL (ref 12.0–15.0)
MCH: 27.8 pg (ref 26.0–34.0)
MCHC: 33.1 g/dL (ref 30.0–36.0)
MCV: 84 fL (ref 78.0–100.0)
Platelets: 233 10*3/uL (ref 150–400)
RBC: 4.31 MIL/uL (ref 3.87–5.11)
RDW: 14 % (ref 11.5–15.5)
WBC: 13.4 10*3/uL — ABNORMAL HIGH (ref 4.0–10.5)

## 2015-05-13 MED ORDER — METHOCARBAMOL 500 MG PO TABS: 500.0000 mg | ORAL_TABLET | Freq: Four times a day (QID) | ORAL | Status: DC | PRN

## 2015-05-13 MED ORDER — TRAMADOL HCL 50 MG PO TABS
50.0000 mg | ORAL_TABLET | Freq: Four times a day (QID) | ORAL | Status: DC | PRN
Start: 2015-05-13 — End: 2015-08-13

## 2015-05-13 MED ORDER — OXYCODONE HCL 5 MG PO TABS: 5.0000 mg | ORAL_TABLET | ORAL | Status: DC | PRN

## 2015-05-13 MED ORDER — FLEET ENEMA 7-19 GM/118ML RE ENEM: 1.0000 | ENEMA | Freq: Once | RECTAL | Status: DC | PRN

## 2015-05-13 MED ORDER — ONDANSETRON HCL 4 MG PO TABS: 4.0000 mg | ORAL_TABLET | Freq: Four times a day (QID) | ORAL | Status: DC | PRN

## 2015-05-13 MED ORDER — DOCUSATE SODIUM 100 MG PO CAPS: 100.0000 mg | ORAL_CAPSULE | Freq: Two times a day (BID) | ORAL | Status: DC

## 2015-05-13 MED ORDER — BISACODYL 10 MG RE SUPP: 10.0000 mg | Freq: Every day | RECTAL | Status: DC | PRN

## 2015-05-13 MED ORDER — POLYETHYLENE GLYCOL 3350 17 G PO PACK: 17.0000 g | PACK | Freq: Every day | ORAL | Status: DC | PRN

## 2015-05-13 MED ORDER — RIVAROXABAN 10 MG PO TABS: 10.0000 mg | ORAL_TABLET | Freq: Every day | ORAL | Status: DC

## 2015-05-13 MED ORDER — ACETAMINOPHEN 325 MG PO TABS: 650.0000 mg | ORAL_TABLET | Freq: Four times a day (QID) | ORAL | Status: DC | PRN

## 2015-05-13 MED ORDER — METOCLOPRAMIDE HCL 5 MG PO TABS: 5.0000 mg | ORAL_TABLET | Freq: Three times a day (TID) | ORAL | Status: DC | PRN

## 2015-05-13 NOTE — Discharge Instructions (Addendum)
° °Dr. Frank Aluisio °Total Joint Specialist °[facility] °[address] °[place], [place] [address] °[phone] ° °TOTAL KNEE REPLACEMENT POSTOPERATIVE DIRECTIONS ° °Knee Rehabilitation, Guidelines Following Surgery  °Results after knee surgery are often greatly improved when you follow the exercise, range of motion and muscle strengthening exercises prescribed by your doctor. Safety measures are also important to protect the knee from further injury. Any time any of these exercises cause you to have increased pain or swelling in your knee joint, decrease the amount until you are comfortable again and slowly increase them. If you have problems or questions, call your caregiver or physical therapist for advice.  ° °HOME CARE INSTRUCTIONS  °Remove items at home which could result in a fall. This includes throw rugs or furniture in walking pathways.  °· ICE to the affected knee every three hours for 30 minutes at a time and then as needed for pain and swelling.  Continue to use ice on the knee for pain and swelling from surgery. You may notice swelling that will progress down to the foot and ankle.  This is normal after surgery.  Elevate the leg when you are not up walking on it.   °· Continue to use the breathing machine which will help keep your temperature down.  It is common for your temperature to cycle up and down following surgery, especially at night when you are not up moving around and exerting yourself.  The breathing machine keeps your lungs expanded and your temperature down. °· Do not place pillow under knee, focus on keeping the knee straight while resting ° °DIET °You may resume your previous home diet once your are discharged from the hospital. ° °DRESSING / WOUND CARE / SHOWERING °You may shower 3 days after surgery, but keep the wounds dry during showering.  You may use an occlusive plastic wrap (Press'n Seal for example), NO SOAKING/SUBMERGING IN THE BATHTUB.  If the  bandage gets wet, change with a clean dry gauze.  If the incision gets wet, pat the wound dry with a clean towel. °You may start showering once you are discharged home but do not submerge the incision under water. Just pat the incision dry and apply a dry gauze dressing on daily. °Change the surgical dressing daily and reapply a dry dressing each time. ° °ACTIVITY °Walk with your walker as instructed. °Use walker as long as suggested by your caregivers. °Avoid periods of inactivity such as sitting longer than an hour when not asleep. This helps prevent blood clots.  °You may resume a sexual relationship in one month or when given the OK by your doctor.  °You may return to work once you are cleared by your doctor.  °Do not drive a car for 6 weeks or until released by you surgeon.  °Do not drive while taking narcotics. ° °WEIGHT BEARING °Weight bearing as tolerated with assist device (walker, cane, etc) as directed, use it as long as suggested by your surgeon or therapist, typically at least 4-6 weeks. ° °POSTOPERATIVE CONSTIPATION PROTOCOL °Constipation - defined medically as fewer than three stools per week and severe constipation as less than one stool per week. ° °One of the most common issues patients have following surgery is constipation.  Even if you have a regular bowel pattern at home, your normal regimen is likely to be disrupted due to multiple reasons following surgery.  Combination of anesthesia, postoperative narcotics, change in appetite and fluid intake all can affect your bowels.    In order to avoid complications following surgery, here are some recommendations in order to help you during your recovery period. ° °Colace (docusate) - Pick up an over-the-counter form of Colace or another stool softener and take twice a day as long as you are requiring postoperative pain medications.  Take with a full glass of water daily.  If you experience loose stools or diarrhea, hold the colace until you stool forms  back up.  If your symptoms do not get better within 1 week or if they get worse, check with your doctor. ° °Dulcolax (bisacodyl) - Pick up over-the-counter and take as directed by the product packaging as needed to assist with the movement of your bowels.  Take with a full glass of water.  Use this product as needed if not relieved by Colace only.  ° °MiraLax (polyethylene glycol) - Pick up over-the-counter to have on hand.  MiraLax is a solution that will increase the amount of water in your bowels to assist with bowel movements.  Take as directed and can mix with a glass of water, juice, soda, coffee, or tea.  Take if you go more than two days without a movement. °Do not use MiraLax more than once per day. Call your doctor if you are still constipated or irregular after using this medication for 7 days in a row. ° °If you continue to have problems with postoperative constipation, please contact the office for further assistance and recommendations.  If you experience "the worst abdominal pain ever" or develop nausea or vomiting, please contact the office immediatly for further recommendations for treatment. ° °ITCHING ° If you experience itching with your medications, try taking only a single pain pill, or even half a pain pill at a time.  You can also use Benadryl over the counter for itching or also to help with sleep.  ° °TED HOSE STOCKINGS °Wear the elastic stockings on both legs for three weeks following surgery during the day but you may remove then at night for sleeping. ° °MEDICATIONS °See your medication summary on the “After Visit Summary” that the nursing staff will review with you prior to discharge.  You may have some home medications which will be placed on hold until you complete the course of blood thinner medication.  It is important for you to complete the blood thinner medication as prescribed by your surgeon.  Continue your approved medications as instructed at time of  discharge. ° °PRECAUTIONS °If you experience chest pain or shortness of breath - call 911 immediately for transfer to the hospital emergency department.  °If you develop a fever greater that 101 F, purulent drainage from wound, increased redness or drainage from wound, foul odor from the wound/dressing, or calf pain - CONTACT YOUR SURGEON.   °                                                °FOLLOW-UP APPOINTMENTS °Make sure you keep all of your appointments after your operation with your surgeon and caregivers. You should call the office at the above phone number and make an appointment for approximately two weeks after the date of your surgery or on the date instructed by your surgeon outlined in the "After Visit Summary". ° ° °RANGE OF MOTION AND STRENGTHENING EXERCISES  °Rehabilitation of the knee is important following a knee injury or   an operation. After just a few days of immobilization, the muscles of the thigh which control the knee become weakened and shrink (atrophy). Knee exercises are designed to build up the tone and strength of the thigh muscles and to improve knee motion. Often times heat used for twenty to thirty minutes before working out will loosen up your tissues and help with improving the range of motion but do not use heat for the first two weeks following surgery. These exercises can be done on a training (exercise) mat, on the floor, on a table or on a bed. Use what ever works the best and is most comfortable for you Knee exercises include:  °Leg Lifts - While your knee is still immobilized in a splint or cast, you can do straight leg raises. Lift the leg to 60 degrees, hold for 3 sec, and slowly lower the leg. Repeat 10-20 times 2-3 times daily. Perform this exercise against resistance later as your knee gets better.  °Quad and Hamstring Sets - Tighten up the muscle on the front of the thigh (Quad) and hold for 5-10 sec. Repeat this 10-20 times hourly. Hamstring sets are done by pushing the  foot backward against an object and holding for 5-10 sec. Repeat as with quad sets.  °· Leg Slides: Lying on your back, slowly slide your foot toward your buttocks, bending your knee up off the floor (only go as far as is comfortable). Then slowly slide your foot back down until your leg is flat on the floor again. °· Angel Wings: Lying on your back spread your legs to the side as far apart as you can without causing discomfort.  °A rehabilitation program following serious knee injuries can speed recovery and prevent re-injury in the future due to weakened muscles. Contact your doctor or a physical therapist for more information on knee rehabilitation.  ° °IF YOU ARE TRANSFERRED TO A SKILLED REHAB FACILITY °If the patient is transferred to a skilled rehab facility following release from the hospital, a list of the current medications will be sent to the facility for the patient to continue.  When discharged from the skilled rehab facility, please have the facility set up the patient's Home Health Physical Therapy prior to being released. Also, the skilled facility will be responsible for providing the patient with their medications at time of release from the facility to include their pain medication, the muscle relaxants, and their blood thinner medication. If the patient is still at the rehab facility at time of the two week follow up appointment, the skilled rehab facility will also need to assist the patient in arranging follow up appointment in our office and any transportation needs. ° °MAKE SURE YOU:  °Understand these instructions.  °Get help right away if you are not doing well or get worse.  ° ° °Pick up stool softner and laxative for home use following surgery while on pain medications. °Do not submerge incision under water. °Please use good hand washing techniques while changing dressing each day. °May shower starting three days after surgery. °Please use a clean towel to pat the incision dry following  showers. °Continue to use ice for pain and swelling after surgery. °Do not use any lotions or creams on the incision until instructed by your surgeon. ° °Take Xarelto for two and a half more weeks, then discontinue Xarelto. °Once the patient has completed the Xarelto, they may resume the 81 mg Aspirin. ° ° °Information on my medicine - XARELTO® (Rivaroxaban) ° °  This medication education was reviewed with me or my healthcare representative as part of my discharge preparation.  The pharmacist that spoke with me during my hospital stay was:  Biagio Borg, [facility]  Why was Xarelto prescribed for you? Xarelto was prescribed for you to reduce the risk of blood clots forming after orthopedic surgery. The medical term for these abnormal blood clots is venous thromboembolism (VTE).  What do you need to know about xarelto ? Take your Xarelto ONCE DAILY at the same time every day. You may take it either with or without food.  If you have difficulty swallowing the tablet whole, you may crush it and mix in applesauce just prior to taking your dose.  Take Xarelto exactly as prescribed by your doctor and DO NOT stop taking Xarelto without talking to the doctor who prescribed the medication.  Stopping without other VTE prevention medication to take the place of Xarelto may increase your risk of developing a clot.  After discharge, you should have regular check-up appointments with your healthcare provider that is prescribing your Xarelto.    What do you do if you miss a dose? If you miss a dose, take it as soon as you remember on the same day then continue your regularly scheduled once daily regimen the next day. Do not take two doses of Xarelto on the same day.   Important Safety Information A possible side effect of Xarelto is bleeding. You should call your healthcare provider right away if you experience any of the following: ? Bleeding from an injury or your nose that does not  stop. ? Unusual colored urine (red or dark brown) or unusual colored stools (red or black). ? Unusual bruising for unknown reasons. ? A serious fall or if you hit your head (even if there is no bleeding).  Some medicines may interact with Xarelto and might increase your risk of bleeding while on Xarelto. To help avoid this, consult your healthcare provider or pharmacist prior to using any new prescription or non-prescription medications, including herbals, vitamins, non-steroidal anti-inflammatory drugs (NSAIDs) and supplements.  This website has more information on Xarelto: [email or web address]

## 2015-05-13 NOTE — Clinical Social Work Note (Signed)
Clinical Social Work Assessment  Patient Details  Name: Tina Bell MRN: [id number] Date of Birth: Feb 08, 1936  Date of referral:  05/13/15               Reason for consult:  Discharge Planning, Facility Placement                Permission sought to share information with:  Facility Art therapist granted to share information::  Yes, Verbal Permission Granted  Name::        Agency::     Relationship::     Contact Information:     Housing/Transportation Living arrangements for the past 2 months:  [place] of Information:  Patient Patient Interpreter Needed:  None Criminal Activity/Legal Involvement Pertinent to Current Situation/Hospitalization:  No - Comment as needed Significant Relationships:  Spouse Lives with:  Spouse Do you feel safe going back to the place where you live?  Yes Need for family participation in patient care:  No (Coment)  Care giving concerns:  Pt's care cannot be managed at home following hospital d/c.   Social Worker assessment / plan:  Pt hospitalized on 05/13/15 for pre planned left total knee replacement. CSW met with pt / spouse to assist with d/c planning. Pt is from [place] living at [facility]. Pt is planning to have ST Rehab at [facility] at d/c. CSW has contacted SNF and d/c plan has been confirmed. CSW will continue to follow to assist with d/c planning to SNF.  Employment status:  Retired Nurse, adult PT Recommendations:  [place] / Referral to community resources:  [place]  Patient/Family's Response to care:  Pt / spouse feel ST rehab is needed.  Patient/Family's Understanding of and Emotional Response to Diagnosis, Current Treatment, and Prognosis:  Pt / spouse are aware of pt's medical status. Pt is very motivated to work with therapy.  Emotional Assessment Appearance:  Appears stated  age Attitude/Demeanor/Rapport:    Affect (typically observed):  Other, Calm, Pleasant (cooperative) Orientation:  Oriented to Self, Oriented to Place, Oriented to  Time, Oriented to Situation Alcohol / Substance use:  Not Applicable Psych involvement (Current and /or in the community):  No (Comment)  Discharge Needs  Concerns to be addressed:  Discharge Planning Concerns Readmission within the last 30 days:  No Current discharge risk:  None Barriers to Discharge:  No Barriers Identified   Luretha Rued, [place] 05/13/2015, 3:45 PM

## 2015-05-13 NOTE — Discharge Summary (Signed)
Physician Discharge Summary   Patient ID: Tina Bell MRN: [id number] DOB/AGE: 10/05/1935 79 y.o.  Admit date: 05/12/2015 Discharge date: 05-14-2015  Primary Diagnosis:  Osteoarthritis Left knee(s)  Admission Diagnoses:  Past Medical History  Diagnosis Date  . Pneumonia 1997  . History of kidney stones 1974,2014  . Arthritis     knees  . Complication of anesthesia     difficulty waking up from anesthesia  . Hx of seasonal allergies   . Pleural effusion 12/2014  . Anemia     hx of   Discharge Diagnoses:   Principal Problem:   OA (osteoarthritis) of knee  Estimated body mass index is 21.97 kg/(m^2) as calculated from the following:   Height as of this encounter: 5' 3" (1.6 m).   Weight as of this encounter: 56.246 kg (124 lb).  Procedure:  Procedure(s) (LRB): LEFT TOTAL KNEE ARTHROPLASTY (Left)   Consults: None  HPI: Tina Bell is a 79 y.o. year old female with end stage OA of her left knee with progressively worsening pain and dysfunction. She has constant pain, with activity and at rest and significant functional deficits with difficulties even with ADLs. She has had extensive non-op management including analgesics, injections of cortisone and viscosupplements, and home exercise program, but remains in significant pain with significant dysfunction. Radiographs show bone on bone arthritis patellofemoral with erosion of patella and trochlea. She presents now for left Total Knee Arthroplasty.   Laboratory Data: Admission on 05/12/2015  Component Date Value Ref Range Status  . WBC 05/13/2015 13.4* 4.0 - 10.5 K/uL Final  . RBC 05/13/2015 4.31  3.87 - 5.11 MIL/uL Final  . Hemoglobin 05/13/2015 12.0  12.0 - 15.0 g/dL Final  . HCT 05/13/2015 36.2  36.0 - 46.0 % Final  . MCV 05/13/2015 84.0  78.0 - 100.0 fL Final  . MCH 05/13/2015 27.8  26.0 - 34.0 pg Final  . MCHC 05/13/2015 33.1  30.0 - 36.0 g/dL Final  . RDW 05/13/2015 14.0  11.5 - 15.5 % Final  . Platelets  05/13/2015 233  150 - 400 K/uL Final  . Sodium 05/13/2015 140  135 - 145 mmol/L Final  . Potassium 05/13/2015 4.4  3.5 - 5.1 mmol/L Final  . Chloride 05/13/2015 109  101 - 111 mmol/L Final  . CO2 05/13/2015 22  22 - 32 mmol/L Final  . Glucose, Bld 05/13/2015 166* 65 - 99 mg/dL Final  . BUN 05/13/2015 13  6 - 20 mg/dL Final  . Creatinine, Ser 05/13/2015 0.64  0.44 - 1.00 mg/dL Final  . Calcium 05/13/2015 8.7* 8.9 - 10.3 mg/dL Final  . GFR calc non Af Amer 05/13/2015 >60  >60 mL/min Final  . GFR calc Af Amer 05/13/2015 >60  >60 mL/min Final   Comment: (NOTE) The eGFR has been calculated using the CKD EPI equation. This calculation has not been validated in all clinical situations. eGFR's persistently <60 mL/min signify possible Chronic Kidney Disease.   . Anion gap 05/13/2015 9  5 - 15 Final  . WBC 05/14/2015 15.2* 4.0 - 10.5 K/uL Final  . RBC 05/14/2015 4.42  3.87 - 5.11 MIL/uL Final  . Hemoglobin 05/14/2015 12.2  12.0 - 15.0 g/dL Final  . HCT 05/14/2015 38.1  36.0 - 46.0 % Final  . MCV 05/14/2015 86.2  78.0 - 100.0 fL Final  . MCH 05/14/2015 27.6  26.0 - 34.0 pg Final  . MCHC 05/14/2015 32.0  30.0 - 36.0 g/dL Final  . RDW 05/14/2015 14.6    11.5 - 15.5 % Final  . Platelets 05/14/2015 264  150 - 400 K/uL Final  . Sodium 05/14/2015 141  135 - 145 mmol/L Final  . Potassium 05/14/2015 4.1  3.5 - 5.1 mmol/L Final  . Chloride 05/14/2015 105  101 - 111 mmol/L Final  . CO2 05/14/2015 28  22 - 32 mmol/L Final  . Glucose, Bld 05/14/2015 109* 65 - 99 mg/dL Final  . BUN 05/14/2015 14  6 - 20 mg/dL Final  . Creatinine, Ser 05/14/2015 0.77  0.44 - 1.00 mg/dL Final  . Calcium 05/14/2015 9.2  8.9 - 10.3 mg/dL Final  . GFR calc non Af Amer 05/14/2015 >60  >60 mL/min Final  . GFR calc Af Amer 05/14/2015 >60  >60 mL/min Final   Comment: (NOTE) The eGFR has been calculated using the CKD EPI equation. This calculation has not been validated in all clinical situations. eGFR's persistently <60 mL/min  signify possible Chronic Kidney Disease.   . Anion gap 05/14/2015 8  5 - 15 Final  Hospital Outpatient Visit on 05/05/2015  Component Date Value Ref Range Status  . MRSA, PCR 05/05/2015 NEGATIVE  NEGATIVE Final  . Staphylococcus aureus 05/05/2015 POSITIVE* NEGATIVE Final   Comment:        The Xpert SA Assay (FDA approved for NASAL specimens in patients over 21 years of age), is one component of a comprehensive surveillance program.  Test performance has been validated by [facility] for patients greater than or equal to 1 year old. It is not intended to diagnose infection nor to guide or monitor treatment.   . aPTT 05/05/2015 32  24 - 37 seconds Final  . WBC 05/05/2015 5.1  4.0 - 10.5 K/uL Final  . RBC 05/05/2015 4.97  3.87 - 5.11 MIL/uL Final  . Hemoglobin 05/05/2015 13.5  12.0 - 15.0 g/dL Final  . HCT 05/05/2015 41.8  36.0 - 46.0 % Final  . MCV 05/05/2015 84.1  78.0 - 100.0 fL Final  . MCH 05/05/2015 27.2  26.0 - 34.0 pg Final  . MCHC 05/05/2015 32.3  30.0 - 36.0 g/dL Final  . RDW 05/05/2015 14.1  11.5 - 15.5 % Final  . Platelets 05/05/2015 217  150 - 400 K/uL Final  . Sodium 05/05/2015 141  135 - 145 mmol/L Final  . Potassium 05/05/2015 3.8  3.5 - 5.1 mmol/L Final  . Chloride 05/05/2015 105  101 - 111 mmol/L Final  . CO2 05/05/2015 31  22 - 32 mmol/L Final  . Glucose, Bld 05/05/2015 95  65 - 99 mg/dL Final  . BUN 05/05/2015 23* 6 - 20 mg/dL Final  . Creatinine, Ser 05/05/2015 0.87  0.44 - 1.00 mg/dL Final  . Calcium 05/05/2015 9.9  8.9 - 10.3 mg/dL Final  . Total Protein 05/05/2015 7.1  6.5 - 8.1 g/dL Final  . Albumin 05/05/2015 4.1  3.5 - 5.0 g/dL Final  . AST 05/05/2015 28  15 - 41 U/L Final  . ALT 05/05/2015 14  14 - 54 U/L Final  . Alkaline Phosphatase 05/05/2015 76  38 - 126 U/L Final  . Total Bilirubin 05/05/2015 0.5  0.3 - 1.2 mg/dL Final  . GFR calc non Af Amer 05/05/2015 >60  >60 mL/min Final  . GFR calc Af Amer 05/05/2015 >60  >60 mL/min Final   Comment:  (NOTE) The eGFR has been calculated using the CKD EPI equation. This calculation has not been validated in all clinical situations. eGFR's persistently <60 mL/min signify possible Chronic Kidney Disease.   .   Anion gap 05/05/2015 5  5 - 15 Final  . Prothrombin Time 05/05/2015 12.8  11.6 - 15.2 seconds Final  . INR 05/05/2015 0.94  0.00 - 1.49 Final  . ABO/RH(D) 05/05/2015 A POS   Final  . Antibody Screen 05/05/2015 NEG   Final  . Sample Expiration 05/05/2015 05/15/2015   Final  . Color, Urine 05/05/2015 YELLOW  YELLOW Final  . APPearance 05/05/2015 TURBID* CLEAR Final  . Specific Gravity, Urine 05/05/2015 1.014  1.005 - 1.030 Final  . pH 05/05/2015 7.5  5.0 - 8.0 Final  . Glucose, UA 05/05/2015 NEGATIVE  NEGATIVE mg/dL Final  . Hgb urine dipstick 05/05/2015 NEGATIVE  NEGATIVE Final  . Bilirubin Urine 05/05/2015 NEGATIVE  NEGATIVE Final  . Ketones, ur 05/05/2015 NEGATIVE  NEGATIVE mg/dL Final  . Protein, ur 05/05/2015 NEGATIVE  NEGATIVE mg/dL Final  . Urobilinogen, UA 05/05/2015 0.2  0.0 - 1.0 mg/dL Final  . Nitrite 05/05/2015 NEGATIVE  NEGATIVE Final  . Leukocytes, UA 05/05/2015 NEGATIVE  NEGATIVE Final  . WBC, UA 05/05/2015 0-2  <3 WBC/hpf Final  . RBC / HPF 05/05/2015 0-2  <3 RBC/hpf Final  . Bacteria, UA 05/05/2015 FEW* RARE Final  . Edwina Barth 05/05/2015 AMORPHOUS URATES/PHOSPHATES   Final  . ABO/RH(D) 05/05/2015 A POS   Final     X-Rays:No results found.  EKG: Orders placed or performed during the hospital encounter of 01/14/15  . EKG test  . EKG test  . EKG 12-Lead  . EKG 12-Lead     Hospital Course: Tina Bell is a 79 y.o. who was admitted to [facility]. They were brought to the operating room on 05/12/2015 and underwent Procedure(s): LEFT TOTAL KNEE ARTHROPLASTY.  Patient tolerated the procedure well and was later transferred to the recovery room and then to the orthopaedic floor for postoperative care.  They were given PO and IV analgesics for pain  control following their surgery.  They were given 24 hours of postoperative antibiotics of  Anti-infectives    Start     Dose/Rate Route Frequency Ordered Stop   05/12/15 2000  vancomycin (VANCOCIN) IVPB 1000 mg/200 mL premix     1,000 mg 200 mL/hr over 60 Minutes Intravenous Every 12 hours 05/12/15 0947 05/12/15 2121   05/12/15 0516  vancomycin (VANCOCIN) IVPB 1000 mg/200 mL premix     1,000 mg 200 mL/hr over 60 Minutes Intravenous On call to O.R. 05/12/15 6195 05/12/15 0656     and started on DVT prophylaxis in the form of Xarelto.   PT and OT were ordered for total joint protocol.  Discharge planning consulted to help with postop disposition and equipment needs.  Social worker consulted to assist with transfer to the skilled rehab unit at [facility].  Patient had a decent night on the evening of surgery and walked over 30 feet that evening.  They started to get up OOB again with therapy on day one. Hemovac drain was pulled without difficulty.  Continued to work with therapy into day two.  Dressing was changed on day two and the incision was healing well.  Patient was seen in rounds and was ready to go to the rehab center at [facility] where they are residents.  Diet - Regular diet Follow up - in 2 weeks Activity - WBAT Disposition - Skilled nursing facility Condition Upon Discharge - Good D/C Meds - See DC Summary DVT Prophylaxis - Xarelto      Discharge Instructions    Call MD / Call 911  Complete by:  As directed   If you experience chest pain or shortness of breath, CALL 911 and be transported to the hospital emergency room.  If you develope a fever above 101 F, pus (white drainage) or increased drainage or redness at the wound, or calf pain, call your surgeon's office.     Change dressing    Complete by:  As directed   Change dressing daily with sterile 4 x 4 inch gauze dressing and apply TED hose. Do not submerge the incision under water.     Constipation Prevention    Complete  by:  As directed   Drink plenty of fluids.  Prune juice may be helpful.  You may use a stool softener, such as Colace (over the counter) 100 mg twice a day.  Use MiraLax (over the counter) for constipation as needed.     Diet - low sodium heart healthy    Complete by:  As directed      Discharge instructions    Complete by:  As directed   Pick up stool softner and laxative for home use following surgery while on pain medications. Do not submerge incision under water. Please use good hand washing techniques while changing dressing each day. May shower starting three days after surgery. Please use a clean towel to pat the incision dry following showers. Continue to use ice for pain and swelling after surgery. Do not use any lotions or creams on the incision until instructed by your surgeon.  Take Xarelto for two and a half more weeks, then discontinue Xarelto. Once the patient has completed the Xarelto, they may resume the 81 mg Aspirin.  Postoperative Constipation Protocol  Constipation - defined medically as fewer than three stools per week and severe constipation as less than one stool per week.  One of the most common issues patients have following surgery is constipation.  Even if you have a regular bowel pattern at home, your normal regimen is likely to be disrupted due to multiple reasons following surgery.  Combination of anesthesia, postoperative narcotics, change in appetite and fluid intake all can affect your bowels.  In order to avoid complications following surgery, here are some recommendations in order to help you during your recovery period.  Colace (docusate) - Pick up an over-the-counter form of Colace or another stool softener and take twice a day as long as you are requiring postoperative pain medications.  Take with a full glass of water daily.  If you experience loose stools or diarrhea, hold the colace until you stool forms back up.  If your symptoms do not get better  within 1 week or if they get worse, check with your doctor.  Dulcolax (bisacodyl) - Pick up over-the-counter and take as directed by the product packaging as needed to assist with the movement of your bowels.  Take with a full glass of water.  Use this product as needed if not relieved by Colace only.   MiraLax (polyethylene glycol) - Pick up over-the-counter to have on hand.  MiraLax is a solution that will increase the amount of water in your bowels to assist with bowel movements.  Take as directed and can mix with a glass of water, juice, soda, coffee, or tea.  Take if you go more than two days without a movement. Do not use MiraLax more than once per day. Call your doctor if you are still constipated or irregular after using this medication for 7 days in a row.  If   you continue to have problems with postoperative constipation, please contact the office for further assistance and recommendations.  If you experience "the worst abdominal pain ever" or develop nausea or vomiting, please contact the office immediatly for further recommendations for treatment.  When discharged from the skilled rehab facility, please have the facility set up the patient's Home Health Physical Therapy prior to being released.  Please make sure this gets set up prior to release in order to avoid any lapse of therapy following the rehab stay.  Also provide the patient with their medications at time of release from the facility to include their pain medication, the muscle relaxants, and their blood thinner medication.  If the patient is still at the rehab facility at time of follow up appointment, please also assist the patient in arranging follow up appointment in our office and any transportation needs. ICE to the affected knee or hip every three hours for 30 minutes at a time and then as needed for pain and swelling.       Do not put a pillow under the knee. Place it under the heel.    Complete by:  As directed      Do  not sit on low chairs, stoools or toilet seats, as it may be difficult to get up from low surfaces    Complete by:  As directed      Driving restrictions    Complete by:  As directed   No driving until released by the physician.     Increase activity slowly as tolerated    Complete by:  As directed      Lifting restrictions    Complete by:  As directed   No lifting until released by the physician.     Patient may shower    Complete by:  As directed   You may shower without a dressing once there is no drainage.  Do not wash over the wound.  If drainage remains, do not shower until drainage stops.     TED hose    Complete by:  As directed   Use stockings (TED hose) for 3 weeks on both leg(s).  You may remove them at night for sleeping.     Weight bearing as tolerated    Complete by:  As directed   Laterality:  left  Extremity:  Lower            Medication List    STOP taking these medications        aspirin EC 81 MG tablet     calcium-vitamin D 500-200 MG-UNIT tablet  Commonly known as:  OSCAL WITH D     celecoxib 200 MG capsule  Commonly known as:  CELEBREX     cholecalciferol 1000 UNITS tablet  Commonly known as:  VITAMIN D     FLUTTER Devi     Krill Oil 1000 MG Caps     vitamin C 500 MG tablet  Commonly known as:  ASCORBIC ACID     WOMENS MULTI VITAMIN & MINERAL PO      TAKE these medications        acetaminophen 325 MG tablet  Commonly known as:  TYLENOL  Take 2 tablets (650 mg total) by mouth every 6 (six) hours as needed for mild pain (or Fever >/= 101).     bisacodyl 10 MG suppository  Commonly known as:  DULCOLAX  Place 1 suppository (10 mg total) rectally daily as needed for moderate constipation.       docusate sodium 100 MG capsule  Commonly known as:  COLACE  Take 1 capsule (100 mg total) by mouth 2 (two) times daily.     guaiFENesin 600 MG 12 hr tablet  Commonly known as:  MUCINEX  Take 1,200 mg by mouth 2 (two) times daily as needed for cough  or to loosen phlegm.     methocarbamol 500 MG tablet  Commonly known as:  ROBAXIN  Take 1 tablet (500 mg total) by mouth every 6 (six) hours as needed for muscle spasms.     metoCLOPramide 5 MG tablet  Commonly known as:  REGLAN  Take 1 tablet (5 mg total) by mouth every 8 (eight) hours as needed for nausea (if ondansetron (ZOFRAN) ineffective.).     ondansetron 4 MG tablet  Commonly known as:  ZOFRAN  Take 1 tablet (4 mg total) by mouth every 6 (six) hours as needed for nausea.     oxyCODONE 5 MG immediate release tablet  Commonly known as:  Oxy IR/ROXICODONE  Take 1-2 tablets (5-10 mg total) by mouth every 3 (three) hours as needed for moderate pain or severe pain.     polyethylene glycol packet  Commonly known as:  MIRALAX / GLYCOLAX  Take 17 g by mouth daily as needed for mild constipation.     rivaroxaban 10 MG Tabs tablet  Commonly known as:  XARELTO  Take 1 tablet (10 mg total) by mouth daily with breakfast. Take Xarelto for two and a half more weeks, then discontinue Xarelto. Once the patient has completed the Xarelto, they may resume the 81 mg Aspirin.     sodium phosphate 7-19 GM/118ML Enem  Place 133 mLs (1 enema total) rectally once as needed for moderate constipation or severe constipation.     traMADol 50 MG tablet  Commonly known as:  ULTRAM  Take 1-2 tablets (50-100 mg total) by mouth every 6 (six) hours as needed (mild pain).       Follow-up Information    Follow up with ALUISIO,FRANK V, MD. Schedule an appointment as soon as possible for a visit on 05/27/2015.   Specialty:  Orthopedic Surgery   Why:  Call office ASAP at [phone] to setup appointment on Tuesday 05/27/2015 with Dr. Aluisio.   Contact information:   [address] [place] [address] [phone]       Signed: Drew , PA-C Orthopaedic Surgery 05/14/2015, 8:00 AM     

## 2015-05-13 NOTE — Care Management Note (Addendum)
Case Management Note  Patient Details  Name: Tina Bell MRN: [id number] Date of Birth: 1936/08/07  Subjective/Objective:                   Left Total Knee Arthroplasty Action/Plan:  Discharge planning Expected Discharge Date:                  Expected Discharge Plan:  [facility]  In-House Referral:     Discharge planning Services  CM Consult  Post Acute Care Choice:    Choice offered to:  NA  DME Arranged:  N/A DME Agency:  NA  HH Arranged:  NA HH Agency:     Status of Service:  Completed, signed off  Medicare Important Message Given:    Date Medicare IM Given:    Medicare IM give by:    Date Additional Medicare IM Given:    Additional Medicare Important Message give by:     If discussed at [place] of Stay Meetings, dates discussed:    Additional Comments: Utilization Review complete. CM notes pt to go to SNF for rehab; CSW aware and arranging.  No other CM needs were communicated. Dellie Catholic, RN 05/13/2015, 1:05 PM

## 2015-05-13 NOTE — Progress Notes (Signed)
Physical Therapy Treatment Patient Details Name: Tina Bell MRN: [id number] DOB: 14-Oct-1935 Today's Date: 05/13/2015    History of Present Illness s/p L TKA    PT Comments    Pt progressing well  Follow Up Recommendations  SNF     Equipment Recommendations  Rolling walker with 5" wheels    Recommendations for Other Services       Precautions / Restrictions Precautions Precautions: Knee Precaution Comments: Reviewed no pillow under knee Required Braces or Orthoses: Knee Immobilizer - Left Knee Immobilizer - Left: Discontinue once straight leg raise with < 10 degree lag Restrictions Weight Bearing Restrictions: Yes Other Position/Activity Restrictions: WBAT    Mobility  Bed Mobility Overal bed mobility: Needs Assistance Bed Mobility: Supine to Sit     Supine to sit: Supervision     General bed mobility comments: Supervision for safety, HOB slightly elevated   Transfers Overall transfer level: Needs assistance Equipment used: Rolling walker (2 wheeled) Transfers: Sit to/from Stand Sit to Stand: Supervision         General transfer comment: cues for hand placement, safety  Ambulation/Gait Ambulation/Gait assistance: Min guard Ambulation Distance (Feet): 120 Feet Assistive device: Rolling walker (2 wheeled) Gait Pattern/deviations: Step-to pattern;Decreased step length - right;Decreased step length - left     General Gait Details: cues for sequence, posture, to get heel down on LLE   Stairs            Wheelchair Mobility    Modified Rankin (Stroke Patients Only)       Balance Overall balance assessment: Needs assistance Sitting-balance support: No upper extremity supported;Feet supported Sitting balance-Leahy Scale: Good     Standing balance support: Bilateral upper extremity supported Standing balance-Leahy Scale: Poor                      Cognition Arousal/Alertness: Awake/alert Behavior During Therapy: WFL for tasks  assessed/performed Overall Cognitive Status: Within Functional Limits for tasks assessed                      Exercises Total Joint Exercises Ankle Circles/Pumps: AROM;10 reps;Both Quad Sets: AROM;Both;Strengthening;10 reps Short Arc QuadSinclair Ship;Left;10 reps Heel Slides: AAROM;Left;10 reps Hip ABduction/ADduction: AAROM;Left;10 reps Straight Leg Raises: 10 reps;Left;AAROM;Strengthening    General Comments        Pertinent Vitals/Pain Pain Assessment: 0-10 Pain Score: 2  Pain Location: L knee Pain Descriptors / Indicators: Sore Pain Intervention(s): Limited activity within patient's tolerance;Monitored during session;Premedicated before session;Ice applied    Home Living Family/patient expects to be discharged to:: Skilled nursing facility (resides at [facility] and plans to go to rehab at [facility]) Living Arrangements: Spouse/significant other             Additional Comments: Pt has a walk-in shower with built in shower seat, grab bars in shower, and elevated toilet seats    Prior Function Level of Independence: Independent          PT Goals (current goals can now be found in the care plan section) Acute Rehab PT Goals Patient Stated Goal: go to rehab at [facility] PT Goal Formulation: With patient Time For Goal Achievement: 05/19/15 Potential to Achieve Goals: Good Progress towards PT goals: Progressing toward goals    Frequency  7X/week    PT Plan Current plan remains appropriate    Co-evaluation             End of Session Equipment Utilized During Treatment: Gait belt;Left knee immobilizer Activity  Tolerance: Patient tolerated treatment well Patient left: in chair;with call bell/phone within reach;with family/visitor present     Time: 0223-3612 PT Time Calculation (min) (ACUTE ONLY): 35 min  Charges:  $Gait Training: 8-22 mins $Therapeutic Exercise: 8-22 mins                    G Codes:      [name] 2015-05-24, 12:02  PM

## 2015-05-13 NOTE — Evaluation (Signed)
Occupational Therapy Evaluation Patient Details Name: Tina Bell MRN: [id number] DOB: Sep 14, 1935 Today's Date: 05/13/2015    History of Present Illness s/p L TKA   Clinical Impression   Patient admitted with above. Patient independent and lived an active lifestyle PTA. Patient currently functioning at an overall supervision to min assist level. D/C from acute OT services and all other OT needs can be met in next venue of care (SNF). All appropriate education provided to patient and family (husband). Please re-order acute OT if needed.      Follow Up Recommendations  SNF;Supervision/Assistance - 24 hour    Equipment Recommendations  Other (comment) (TBD)    Recommendations for Other Services  None at this time   Precautions / Restrictions Precautions Precautions: Knee Precaution Comments: Reviewed no pillow under knee Required Braces or Orthoses: Knee Immobilizer - Left Knee Immobilizer - Left: Discontinue once straight leg raise with < 10 degree lag Restrictions Weight Bearing Restrictions: Yes Other Position/Activity Restrictions: WBAT    Mobility Bed Mobility Overal bed mobility: Needs Assistance Bed Mobility: Supine to Sit     Supine to sit: Supervision     General bed mobility comments: Supervision for safety, HOB slightly elevated   Transfers Overall transfer level: Needs assistance Equipment used: Rolling walker (2 wheeled) Transfers: Sit to/from Stand Sit to Stand: Supervision General transfer comment: Supervision for safety, minimal cueing for hand placement and technique     Balance Overall balance assessment: Needs assistance Sitting-balance support: No upper extremity supported;Feet supported Sitting balance-Leahy Scale: Good     Standing balance support: Bilateral upper extremity supported;During functional activity Standing balance-Leahy Scale: Fair    ADL Overall ADL's : Needs assistance/impaired Eating/Feeding: Set up;Sitting    Grooming: Supervision/safety;Standing   Upper Body Bathing: Set up;Sitting   Lower Body Bathing: Minimal assistance;Sit to/from stand;Cueing for safety   Upper Body Dressing : Set up;Sitting   Lower Body Dressing: Minimal assistance;Sit to/from stand;Cueing for safety   Toilet Transfer: Supervision/safety;RW;BSC;Ambulation   Toileting- Water quality scientist and Hygiene: Supervision/safety;Sit to/from Nurse, children's Details (indicate cue type and reason): did not occur Functional mobility during ADLs: Supervision/safety;Cueing for safety;Rolling walker General ADL Comments: Pt ambulated to and from BR for toilet transfer onto [facility] over toilet seat. Pt able to reach BLEs and believe with practice will be able to complete LB ADLs with no problem. Pt required min vcueing for safey with RW, but is overall safe using RW.     Pertinent Vitals/Pain Pain Assessment: 0-10 Pain Score: 2  Pain Location: L knee Pain Descriptors / Indicators: Sore Pain Intervention(s): Monitored during session;Repositioned   Extremity/Trunk Assessment Upper Extremity Assessment Upper Extremity Assessment: Overall WFL for tasks assessed   Lower Extremity Assessment Lower Extremity Assessment: Defer to PT evaluation   Cervical / Trunk Assessment Cervical / Trunk Assessment: Normal   Communication Communication Communication: No difficulties   Cognition Arousal/Alertness: Awake/alert Behavior During Therapy: WFL for tasks assessed/performed Overall Cognitive Status: Within Functional Limits for tasks assessed              Home Living Family/patient expects to be discharged to:: Skilled nursing facility (resides at [facility] and plans to go to rehab at [facility]) Living Arrangements: Spouse/significant other Additional Comments: Pt has a walk-in shower with built in shower seat, grab bars in shower, and elevated toilet seats     Prior Functioning/Environment Level of Independence:  Independent     OT Diagnosis: Generalized weakness;Acute pain   OT Problem List:  n/a, no acute OT needs identified    OT Treatment/Interventions:   n/a, no acute OT needs identified    OT Goals(Current goals can be found in the care plan section) Acute Rehab OT Goals Patient Stated Goal: go to rehab at [facility] OT Goal Formulation: All assessment and education complete, DC therapy (all needs can be met at SNF)  OT Frequency:   n/a, no acute OT needs identified    Barriers to D/C:  None known at this time    End of Session Equipment Utilized During Treatment: Rolling walker;Left knee immobilizer CPM Left Knee CPM Left Knee: Off  Activity Tolerance: Patient tolerated treatment well Patient left: in chair;with call bell/phone within reach;with family/visitor present   Time: 9292-4462 OT Time Calculation (min): 23 min Charges:  OT General Charges $OT Visit: 1 Procedure OT Evaluation $Initial OT Evaluation Tier I: 1 Procedure OT Treatments $Self Care/Home Management : 8-22 mins Cutberto Winfree , MS, OTR/L, CLT Pager: [phone]  05/13/2015, 10:15 AM

## 2015-05-13 NOTE — Progress Notes (Addendum)
   Subjective: 1 Day Post-Op Procedure(s) (LRB): LEFT TOTAL KNEE ARTHROPLASTY (Left) Patient reports pain as mild.   Patient seen in rounds with Dr. Wynelle Link.  Doing okay this morning. Husband in room at bedside. Patient is well, but has had some minor complaints of pain in the knee, requiring pain medications We will resume therapy today.  She walked 34 feet with therapy yesterday. Plan is to go [facility] after hospital stay.  Objective: Vital signs in last 24 hours: Temp:  [97.5 F (36.4 C)-98.2 F (36.8 C)] 98.2 F (36.8 C) (10/04 7681) Pulse Rate:  [59-80] 59 (10/04 0637) Resp:  [11-18] 18 (10/04 0126) BP: (103-149)/(63-85) 103/63 mmHg (10/04 0637) SpO2:  [97 %-100 %] 100 % (10/04 0637)  Intake/Output from previous day:  Intake/Output Summary (Last 24 hours) at 05/13/15 0839 Last data filed at 05/13/15 0646  Gross per 24 hour  Intake 2983.75 ml  Output   1565 ml  Net 1418.75 ml    Intake/Output this shift: UOP about 1200 prior to MN  Labs:  Recent Labs  05/13/15 0451  HGB 12.0    Recent Labs  05/13/15 0451  WBC 13.4*  RBC 4.31  HCT 36.2  PLT 233    Recent Labs  05/13/15 0451  NA 140  K 4.4  CL 109  CO2 22  BUN 13  CREATININE 0.64  GLUCOSE 166*  CALCIUM 8.7*   No results for input(s): LABPT, INR in the last 72 hours.  EXAM General - Patient is Alert, Appropriate and Oriented Extremity - Neurovascular intact Sensation intact distally Dorsiflexion/Plantar flexion intact Dressing - dressing C/D/I Motor Function - intact, moving foot and toes well on exam.  Hemovac pulled without difficulty.  Past Medical History  Diagnosis Date  . Pneumonia 1997  . History of kidney stones 1572,6203  . Arthritis     knees  . Complication of anesthesia     difficulty waking up from anesthesia  . Hx of seasonal allergies   . Pleural effusion 12/2014  . Anemia     hx of    Assessment/Plan: 1 Day Post-Op Procedure(s) (LRB): LEFT TOTAL KNEE  ARTHROPLASTY (Left) Principal Problem:   OA (osteoarthritis) of knee  Estimated body mass index is 21.97 kg/(m^2) as calculated from the following:   Height as of this encounter: 5\' 3"  (1.6 m).   Weight as of this encounter: 56.246 kg (124 lb). Advance diet Up with therapy Plan for discharge tomorrow Discharge to SNF - [facility] if arrangements can be completed.  DVT Prophylaxis - Xarelto Weight-Bearing as tolerated to left leg D/C O2 and Pulse OX and try on Room Air  [name], PA-C Orthopaedic Surgery 05/13/2015, 8:39 AM

## 2015-05-13 NOTE — Progress Notes (Signed)
   05/13/15 1500  PT Visit Information  Last PT Received On 05/13/15  Assistance Needed +1  History of Present Illness s/p L TKA  PT Time Calculation  PT Start Time (ACUTE ONLY) 1517  PT Stop Time (ACUTE ONLY) 1540  PT Time Calculation (min) (ACUTE ONLY) 23 min  Subjective Data  Patient Stated Goal go to rehab at [facility]  Precautions  Precautions Knee  Precaution Comments Reviewed no pillow under knee  Required Braces or Orthoses Knee Immobilizer - Left  Knee Immobilizer - Left Discontinue once straight leg raise with < 10 degree lag  Restrictions  Other Position/Activity Restrictions WBAT  Pain Assessment  Pain Assessment 0-10  Pain Score 3  Pain Location L knee  Pain Descriptors / Indicators Sore  Pain Intervention(s) Limited activity within patient's tolerance;Monitored during session;Ice applied  Cognition  Arousal/Alertness Awake/alert  Behavior During Therapy WFL for tasks assessed/performed  Overall Cognitive Status Within Functional Limits for tasks assessed  Bed Mobility  Overal bed mobility Needs Assistance  Bed Mobility Sit to Supine  Sit to supine Min assist  General bed mobility comments incr time, cues for technique  Transfers  Overall transfer level Needs assistance  Equipment used Rolling walker (2 wheeled)  Transfers Sit to/from Stand  Sit to Stand Supervision  General transfer comment cues for hand placement, safety  Ambulation/Gait  Ambulation/Gait assistance Supervision;Min guard  Ambulation Distance (Feet) 100 Feet (15' more)  Assistive device Rolling walker (2 wheeled)  Gait Pattern/deviations Step-to pattern;Decreased step length - right;Decreased step length - left;Antalgic  General Gait Details cues for sequence, posture, to get heel down on LLE  Total Joint Exercises  Ankle Circles/Pumps AROM;10 reps;Both  PT - End of Session  Equipment Utilized During Treatment Gait belt;Left knee immobilizer  Activity Tolerance Patient tolerated  treatment well  Patient left in chair;with call bell/phone within reach;with family/visitor present  Nurse Communication Mobility status  PT - Assessment/Plan  PT Plan Current plan remains appropriate  PT Frequency (ACUTE ONLY) 7X/week  Follow Up Recommendations SNF  PT equipment Rolling walker with 5" wheels  PT Goal Progression  Progress towards PT goals Progressing toward goals  Acute Rehab PT Goals  PT Goal Formulation With patient  Time For Goal Achievement 05/19/15  Potential to Achieve Goals Good  PT General Charges  $$ ACUTE PT VISIT 1 Procedure  PT Treatments  $Gait Training 23-37 mins

## 2015-05-14 LAB — CBC
HCT: 38.1 % (ref 36.0–46.0)
Hemoglobin: 12.2 g/dL (ref 12.0–15.0)
MCH: 27.6 pg (ref 26.0–34.0)
MCHC: 32 g/dL (ref 30.0–36.0)
MCV: 86.2 fL (ref 78.0–100.0)
Platelets: 264 10*3/uL (ref 150–400)
RBC: 4.42 MIL/uL (ref 3.87–5.11)
RDW: 14.6 % (ref 11.5–15.5)
WBC: 15.2 10*3/uL — ABNORMAL HIGH (ref 4.0–10.5)

## 2015-05-14 LAB — BASIC METABOLIC PANEL
Anion gap: 8 (ref 5–15)
BUN: 14 mg/dL (ref 6–20)
CO2: 28 mmol/L (ref 22–32)
Calcium: 9.2 mg/dL (ref 8.9–10.3)
Chloride: 105 mmol/L (ref 101–111)
Creatinine, Ser: 0.77 mg/dL (ref 0.44–1.00)
GFR calc Af Amer: >60 mL/min (ref >60)
GFR calc non Af Amer: >60 mL/min (ref >60)
Glucose, Bld: 109 mg/dL — ABNORMAL HIGH (ref 65–99)
Potassium: 4.1 mmol/L (ref 3.5–5.1)
Sodium: 141 mmol/L (ref 135–145)

## 2015-05-14 NOTE — Progress Notes (Signed)
Report given to Tina Latino, RN at [facility] informed her patient was in route to facility.

## 2015-05-14 NOTE — Progress Notes (Signed)
Physical Therapy Treatment Patient Details Name: Tina Bell MRN: [id number] DOB: [birth year] Today's Date: 05/14/2015    History of Present Illness s/p L TKA    PT Comments    Pt progressing well, planning for rehab at [facility] prior to return to [facility] living;  Follow Up Recommendations  SNF     Equipment Recommendations  Rolling walker with 5" wheels    Recommendations for Other Services       Precautions / Restrictions Precautions Precautions: Knee Precaution Comments: Reviewed no pillow under knee Required Braces or Orthoses: Knee Immobilizer - Left Knee Immobilizer - Left: Discontinue once straight leg raise with < 10 degree lag Restrictions Weight Bearing Restrictions: No Other Position/Activity Restrictions: WBAT    Mobility  Bed Mobility   Bed Mobility: Supine to Sit;Sit to Supine     Supine to sit: Supervision Sit to supine: Supervision   General bed mobility comments: incr time, cues for technique, pt self assists LLE with RLE  Transfers Overall transfer level: Needs assistance Equipment used: Rolling walker (2 wheeled) Transfers: Sit to/from Stand Sit to Stand: Supervision         General transfer comment: cues for hand placement, safety  Ambulation/Gait Ambulation/Gait assistance: Supervision Ambulation Distance (Feet): 130 Feet Assistive device: Rolling walker (2 wheeled) Gait Pattern/deviations: Step-through pattern;Step-to pattern;Decreased weight shift to left     General Gait Details: cues for sequence, posture, to progress to step through   [employer]            Wheelchair Mobility    Modified Rankin (Stroke Patients Only)       Balance                                    Cognition Arousal/Alertness: Awake/alert Behavior During Therapy: WFL for tasks assessed/performed Overall Cognitive Status: Within Functional Limits for tasks assessed                      Exercises Total Joint  Exercises Ankle Circles/Pumps: AROM;10 reps;Both Quad Sets: AROM;Both;Strengthening;10 reps Short Arc QuadSinclair Ship;Left;10 reps Heel Slides: AAROM;Left;10 reps Hip ABduction/ADduction: AAROM;Left;10 reps Straight Leg Raises: 10 reps;Left;AAROM;Strengthening    General Comments        Pertinent Vitals/Pain Pain Assessment: 0-10 Pain Score: 3  Pain Location: L knee Pain Descriptors / Indicators: Aching Pain Intervention(s): Limited activity within patient's tolerance;Monitored during session;Patient requesting pain meds-RN notified;Ice applied    Home Living                      Prior Function            PT Goals (current goals can now be found in the care plan section) Acute Rehab PT Goals Patient Stated Goal: go to rehab at [facility] PT Goal Formulation: With patient Time For Goal Achievement: 05/19/15 Potential to Achieve Goals: Good Progress towards PT goals: Progressing toward goals    Frequency  7X/week    PT Plan Current plan remains appropriate    Co-evaluation             End of Session Equipment Utilized During Treatment: Gait belt;Left knee immobilizer Activity Tolerance: Patient tolerated treatment well Patient left: in chair;with call bell/phone within reach;with family/visitor present     Time: 8416-6063 PT Time Calculation (min) (ACUTE ONLY): 37 min  Charges:  $Gait Training: 8-22 mins $Therapeutic Exercise: 8-22 mins  G CodesKenyon Ana 05/14/2015, 11:35 AM

## 2015-05-14 NOTE — Progress Notes (Signed)
Pt / spouse are in agreement with d/c to [facility] today. PT approved transport by car. NSG reviewed d/c summary, scripts, avs. Scripts included in d/c packet. D/C summary sent to SNF for review prior to d/c. D/C packet provided to pt prior to d/c.  Werner Lean LCSW [phone]

## 2015-05-14 NOTE — Progress Notes (Signed)
Attempted to call report x 4  to [facility] @ [phone]. Left message for nurse to call me back for report.  Discharge/transfer  paper work sent with husband. Patient discharge to [facility]) via private vehicle no complaints at this time.

## 2015-05-14 NOTE — Progress Notes (Signed)
   Subjective: 2 Days Post-Op Procedure(s) (LRB): LEFT TOTAL KNEE ARTHROPLASTY (Left) Patient reports pain as mild.   Patient seen in rounds with Dr. Wynelle Link. Patient is well, and has had no acute complaints or problems Patient is ready to go home today  Objective: Vital signs in last 24 hours: Temp:  [97.9 F (36.6 C)-98.8 F (37.1 C)] 98.8 F (37.1 C) (10/05 0526) Pulse Rate:  [52-71] 60 (10/05 0526) Resp:  [16-18] 16 (10/05 0526) BP: (100-127)/(58-69) 127/69 mmHg (10/05 0526) SpO2:  [96 %-99 %] 96 % (10/05 0526)  Intake/Output from previous day:  Intake/Output Summary (Last 24 hours) at 05/14/15 0730 Last data filed at 05/13/15 1900  Gross per 24 hour  Intake    720 ml  Output      0 ml  Net    720 ml    Labs:  Recent Labs  05/13/15 0451 05/14/15 0423  HGB 12.0 12.2    Recent Labs  05/13/15 0451 05/14/15 0423  WBC 13.4* 15.2*  RBC 4.31 4.42  HCT 36.2 38.1  PLT 233 264    Recent Labs  05/13/15 0451 05/14/15 0423  NA 140 141  K 4.4 4.1  CL 109 105  CO2 22 28  BUN 13 14  CREATININE 0.64 0.77  GLUCOSE 166* 109*  CALCIUM 8.7* 9.2   No results for input(s): LABPT, INR in the last 72 hours.  EXAM: General - Patient is Alert, Appropriate and Oriented Extremity - Neurovascular intact Sensation intact distally Dorsiflexion/Plantar flexion intact Incision - clean, dry, no drainage Motor Function - intact, moving foot and toes well on exam.   Assessment/Plan: 2 Days Post-Op Procedure(s) (LRB): LEFT TOTAL KNEE ARTHROPLASTY (Left) Procedure(s) (LRB): LEFT TOTAL KNEE ARTHROPLASTY (Left) Past Medical History  Diagnosis Date  . Pneumonia 1997  . History of kidney stones [id number]  . Arthritis     knees  . Complication of anesthesia     difficulty waking up from anesthesia  . Hx of seasonal allergies   . Pleural effusion 12/2014  . Anemia     hx of   Principal Problem:   OA (osteoarthritis) of knee  Estimated body mass index is 21.97  kg/(m^2) as calculated from the following:   Height as of this encounter: 5\' 3"  (1.6 m).   Weight as of this encounter: 56.246 kg (124 lb). Up with therapy Diet - Regular diet Follow up - in 2 weeks Activity - WBAT Disposition - Skilled nursing facility Condition Upon Discharge - Good D/C Meds - See DC Summary DVT Prophylaxis - Xarelto  [name], PA-C Orthopaedic Surgery 05/14/2015, 7:30 AM

## 2015-06-27 ENCOUNTER — Other Ambulatory Visit: Payer: Self-pay | Admitting: *Deleted

## 2015-06-27 DIAGNOSIS — R918 Other nonspecific abnormal finding of lung field: Secondary | ICD-10-CM

## 2015-07-08 ENCOUNTER — Ambulatory Visit: Payer: Medicare Other | Admitting: Pulmonary Disease

## 2015-08-01 LAB — CREATININE, SERUM: Creat: 0.72 mg/dL (ref 0.60–0.93)

## 2015-08-06 ENCOUNTER — Ambulatory Visit
Admission: RE | Admit: 2015-08-06 | Discharge: 2015-08-06 | Disposition: A | Discharge status: Home / Self Care | Payer: Medicare Other | Source: Ambulatory Visit | Attending: Cardiothoracic Surgery | Admitting: Cardiothoracic Surgery

## 2015-08-06 ENCOUNTER — Encounter: Payer: Medicare Other | Admitting: Cardiothoracic Surgery

## 2015-08-06 DIAGNOSIS — R918 Other nonspecific abnormal finding of lung field: Secondary | ICD-10-CM

## 2015-08-06 MED ORDER — IOPAMIDOL (ISOVUE-300) INJECTION 61%
75.0000 mL | Freq: Once | INTRAVENOUS | Status: AC | PRN
  Administered 2015-08-06: 75 mL via INTRAVENOUS

## 2015-08-07 ENCOUNTER — Encounter: Payer: Medicare Other | Admitting: Cardiothoracic Surgery

## 2015-08-13 ENCOUNTER — Encounter: Payer: Self-pay | Admitting: Cardiothoracic Surgery

## 2015-08-13 ENCOUNTER — Ambulatory Visit (INDEPENDENT_AMBULATORY_CARE_PROVIDER_SITE_OTHER): Payer: Medicare Other | Admitting: Cardiothoracic Surgery

## 2015-08-13 VITALS — BP 157/90 | HR 60 | Resp 20 | Ht 63.0 in | Wt 124.0 lb

## 2015-08-13 DIAGNOSIS — I712 Thoracic aortic aneurysm, without rupture, unspecified: Secondary | ICD-10-CM

## 2015-08-13 DIAGNOSIS — J869 Pyothorax without fistula: Secondary | ICD-10-CM

## 2015-08-13 DIAGNOSIS — R911 Solitary pulmonary nodule: Secondary | ICD-10-CM | POA: Diagnosis not present

## 2015-08-13 NOTE — Progress Notes (Signed)
PCP is Horatio Pel, MD Referring Provider is Deland Pretty, MD  Chief Complaint  Patient presents with  . Follow-up    6 month f/u with Chest CT  history right empyema  ( staphylococcal) status post drainage and decortication May 2016  History of chronic lung disease-bronchiectasis  4 cm fusiform ascending aortic aneurysm  HPI: Patient returns for scheduled visit with a CT scan with contrast for followup of a mild fusiform ascending aortic aneurysm as well as some clusters of small poor nodules in the right lower lobe and right upper lobe. She has no active symptoms of chest pain or cough or pulmonary disease. Cultures of the material taken at the time of decortication were positive for staph, negative for Mycobacterium  Patient recently had a left total knee replacement but did well  CT scan performed today personally reviewed and counseled with patient and husband. There is complete resolution of the previous right empyema. The ascending aortic is form aneurysm is stable at 4.0 cm, non-surgical. The small nodular clusters in the right lung are stable. They  appear to be inflammatory  Past Medical History  Diagnosis Date  . Pneumonia 1997  . History of kidney stones [id number]  . Arthritis     knees  . Complication of anesthesia     difficulty waking up from anesthesia  . Hx of seasonal allergies   . Pleural effusion 12/2014  . Anemia     hx of    Past Surgical History  Procedure Laterality Date  . Bladder suspension  1998  . Knee arthroscopy Right 1999  . Nose surgery  1989    repair of deviated septum  . Dilation and curettage of uterus      3 procedures in the 1980's for spotting  . Video assisted thoracoscopy (vats)/empyema Right 01/06/2015    Procedure: VIDEO ASSISTED THORACOSCOPY (VATS)/EMPYEMA AND DRAINAGE OF EMPYEMA;  Surgeon: Ivin Poot, MD;  Location: [facility];  Service: Thoracic;  Laterality: Right;  . Total knee arthroplasty Left 05/12/2015   Procedure: LEFT TOTAL KNEE ARTHROPLASTY;  Surgeon: Gaynelle Arabian, MD;  Location: [facility];  Service: Orthopedics;  Laterality: Left;    Family History  Problem Relation Age of Onset  . Cancer Mother     ovarian  . Cancer Sister     breast    Social History Social History  Substance Use Topics  . Smoking status: Former Smoker -- 0.20 packs/day for 5 years    Types: Cigarettes    Quit date: 05/03/1962  . Smokeless tobacco: Never Used     Comment: Smoked 1 pack/week while smoking.   . Alcohol Use: Yes     Comment: occasional drink    Current Outpatient Prescriptions  Medication Sig Dispense Refill  . acetaminophen (TYLENOL) 325 MG tablet Take 2 tablets (650 mg total) by mouth every 6 (six) hours as needed for mild pain (or Fever >/= 101). 40 tablet 0  . brimonidine (ALPHAGAN P) 0.1 % SOLN Place 1 drop into the right eye 3 (three) times daily.     No current facility-administered medications for this visit.   Facility-Administered Medications Ordered in Other Visits  Medication Dose Route Frequency Provider Last Rate Last Dose  . levofloxacin (LEVAQUIN) IVPB 500 mg  500 mg Intravenous Q24H Festus Aloe, MD        Allergies  Allergen Reactions  . Keflex [Cephalexin] Hives    Tolerated Augmentin  . Morphine And Related Hives    Review of Systems  Review of Systems :  [ y ] = yes, [  ] = no        General :  Weight gain [   ]    Weight loss  [   ]  Fatigue [  ]  Fever [  ]  Chills  [  ]                                Weakness  [  ]           Cardiac :  Chest pain/ pressure [n  ]  Resting SOB [n  ] exertional SOB [n  ]                        Orthopnea [  ]  Pedal edema  [  ]  Palpitations [  ] Syncope/presyncope [ ]                         Paroxysmal nocturnal dyspnea [  ]        Pulmonary : cough [ n ]  wheezing [  ]  Hemoptysis [  ] Sputum [n  ] Snoring [  ]                              Pneumothorax [  ]  Sleep apnea [  ]       GI : Vomiting [  ]  Dysphagia [   ]  Melena  [  ]  Abdominal pain [  ] BRBPR [  ]              Heart burn [  ]  Constipation [  ] Diarrhea  [  ] Colonoscopy [  ]       GU : Hematuria [  ]  Dysuria [  ]  Nocturia [  ] UTI's [  ]       Vascular : Claudication [  ]  Rest pain [  ]  DVT [  ] Vein stripping [  ] leg ulcers [  ]                          TIA [  ] Stroke [  ]  Varicose veins [  ]       NEURO :  Headaches  [  ] Seizures [  ] Vision changes [  ] Paresthesias [  ]       Musculoskeletal :  Arthritis Blue.Reese  ] Gout  [  ]  Back pain [  ]  Joint pain [  ]       Skin :  Rash [  ]  Melanoma [  ]        Heme : Bleeding problems [  ]Clotting Disorders [  ] Anemia [  ]Blood Transfusion [ ]        Endocrine : Diabetes [  ] Thyroid Disorder  [  ]       Psych : Depression [  ]  Anxiety [  ]  Psych hospitalizations [  ]  BP 157/90 mmHg  Pulse 60  Resp 20  Ht 5\' 3"  (1.6 m)  Wt 124 lb (56.246 kg)  BMI 21.97 kg/m2  SpO2 96% Physical Exam     Physical Exam  General: Pleasanthealthy appearing middle-aged female no acute distress HEENT: Normocephalic pupils equal , dentition adequate Neck: Supple without JVD, adenopathy, or bruit Chest: Clear to auscultation, symmetrical breath sounds, no rhonchi, no tenderness             or deformity Cardiovascular: Regular rate and rhythm, no murmur, no gallop, peripheral pulses             palpable in all extremities Abdomen:  Soft, nontender, no palpable mass or organomegaly Extremities: Warm, well-perfused, no clubbing cyanosis edema or tenderness,well-healed surgical incision over left knee              no venous stasis changes of the legs Rectal/GU: Deferred Neuro: Grossly non--focal and symmetrical throughout Skin: Clean and dry without rash or ulceration   Diagnostic Tests: CT scan performed today shows resolution the previous empyema The ascending aorta stable with a 4.0 cm diameter consistent with mild fusiform aneurysm. No wall  thickening or ulceration or hematoma  She has some inflammatory changes in the right upper lobe and right lower lobe, small clusters of tiny nodules. Air stable.  Impression: Stable fusiform ascending aneurysm-mild Stable small clusters of pulmonary nodules right upper lobe right lower lobe consistent with inflammation   Plan: Return for followup CT scan with contrast in one year.  Len Childs, MD [facility] [phone]

## 2015-10-03 ENCOUNTER — Ambulatory Visit (INDEPENDENT_AMBULATORY_CARE_PROVIDER_SITE_OTHER): Payer: Medicare Other | Admitting: Pulmonary Disease

## 2015-10-03 ENCOUNTER — Encounter: Payer: Self-pay | Admitting: Pulmonary Disease

## 2015-10-03 VITALS — BP 126/72 | HR 70 | Ht 63.0 in | Wt 128.0 lb

## 2015-10-03 DIAGNOSIS — J470 Bronchiectasis with acute lower respiratory infection: Secondary | ICD-10-CM

## 2015-10-03 DIAGNOSIS — J869 Pyothorax without fistula: Secondary | ICD-10-CM | POA: Diagnosis not present

## 2015-10-03 NOTE — Progress Notes (Signed)
Subjective:    Patient ID: Tina Bell, female    DOB: 02-Aug-1936, 80 y.o.   MRN: [id number]  Synopsis: First referred to [facility] in 2015. Found to have bronchiectasis on high-resolution CT in 2015 this is believed to be due to a severe pertussis infection as a child. Never smoked 12/2013 CT chest high res (entrikin read)> scattered throughout the lungs, particularly in the lingula and RML, there is cylindrical bronchiectasis and bronchiolectasis with nodularity suggestive of MAI, extensive air trapping and atherosclerosis, small hiatal hernia June 2015 pulmonary function test> normal ratio, FEV1 93% predicted, no change with bronchodilator, total lung capacity normal, DLCO slightly low at 73% predicted In June 2016 she had community acquired pneumonia with an empyema required VATs  HPI  Chief Complaint  Patient presents with  . Follow-up    6 month rov bronchiectasis.  pt has no breathing complaints today.     Garla just got back from a three week cruise to the [place].  They went as far as [place].  The total trip was 20 days.  She had cataract surgery yesterday. Her breathing has been fine.  She has not had another cold recently.  She was around some folks yesterday with her cataract surgery who was sick (a Marine scientist).  They now live in [place] in a stand alone house.  She is about to start playing tennis soon.  She had her knee surgery and it went well. She says she is the best table tennis player in her "old folks home".     Past Medical History  Diagnosis Date  . Pneumonia 1997  . History of kidney stones [id number]  . Arthritis     knees  . Complication of anesthesia     difficulty waking up from anesthesia  . Hx of seasonal allergies   . Pleural effusion 12/2014  . Anemia     hx of     Review of Systems  Constitutional: Negative for fever, chills and fatigue.  HENT: Negative for rhinorrhea and sinus pressure.   Respiratory: Negative for cough, chest  tightness, shortness of breath and wheezing.   Cardiovascular: Negative for chest pain, palpitations and leg swelling.       Objective:   Physical Exam Filed Vitals:   10/03/15 1414  BP: 126/72  Pulse: 70  Height: 5\' 3"  (1.6 m)  Weight: 128 lb (58.06 kg)  SpO2: 97%   RA  Gen: no acute distress HEENT: NCAT, OP clear, PULM: CTA B, few crackles R base, normal effort CV: RRR, no mgr, no JVD AB: BS+, soft, nontender, no hsm Ext: warm, no edema, no clubbing, no cyanosis Derm: no rash or skin breakdown      Assessment & Plan:   Bronchiectasis without acute exacerbation (HCC) This has been a stable interval for her and she has not had an exacerbation lately. She is remaining active and about to resume tennis again.  Plan: Continue expectant monitoring, infection prevention measures, daily exercise for mucociliary clearance  Empyema of lung No evidence    Updated Medication List Outpatient Encounter Prescriptions as of 10/03/2015  Medication Sig  . brimonidine (ALPHAGAN P) 0.1 % SOLN Place 1 drop into the right eye 3 (three) times daily.  . celecoxib (CELEBREX) 200 MG capsule Take 200 mg by mouth daily.  . [DISCONTINUED] acetaminophen (TYLENOL) 325 MG tablet Take 2 tablets (650 mg total) by mouth every 6 (six) hours as needed for mild pain (or Fever >/= 101).   Facility-Administered  Encounter Medications as of 10/03/2015  Medication  . levofloxacin (LEVAQUIN) IVPB 500 mg

## 2015-10-03 NOTE — Assessment & Plan Note (Signed)
This has been a stable interval for her and she has not had an exacerbation lately. She is remaining active and about to resume tennis again.  Plan: Continue expectant monitoring, infection prevention measures, daily exercise for mucociliary clearance

## 2015-10-03 NOTE — Assessment & Plan Note (Signed)
No evidence

## 2015-10-03 NOTE — Patient Instructions (Signed)
Exercise regularly, wash your hands frequently when in public Follow-up with me in 6 months or sooner if needed

## 2015-10-28 ENCOUNTER — Other Ambulatory Visit: Payer: Self-pay | Admitting: Urology

## 2015-10-30 ENCOUNTER — Encounter (HOSPITAL_COMMUNITY): Payer: Self-pay

## 2015-10-30 ENCOUNTER — Encounter (HOSPITAL_COMMUNITY)
Admission: RE | Admit: 2015-10-30 | Discharge: 2015-10-30 | Disposition: A | Discharge status: Home / Self Care | Payer: Medicare Other | Source: Ambulatory Visit | Attending: Urology | Admitting: Urology

## 2015-10-30 DIAGNOSIS — N200 Calculus of kidney: Secondary | ICD-10-CM | POA: Diagnosis not present

## 2015-10-30 DIAGNOSIS — Z01812 Encounter for preprocedural laboratory examination: Secondary | ICD-10-CM | POA: Insufficient documentation

## 2015-10-30 HISTORY — DX: Other specified postprocedural states: Z98.890

## 2015-10-30 HISTORY — DX: Other specified postprocedural states: R11.2

## 2015-10-30 HISTORY — DX: Bronchiectasis, uncomplicated: J47.9

## 2015-10-30 LAB — BASIC METABOLIC PANEL
Anion gap: 8 (ref 5–15)
BUN: 19 mg/dL (ref 6–20)
CO2: 29 mmol/L (ref 22–32)
Calcium: 9.6 mg/dL (ref 8.9–10.3)
Chloride: 106 mmol/L (ref 101–111)
Creatinine, Ser: 0.79 mg/dL (ref 0.44–1.00)
GFR calc Af Amer: >60 mL/min (ref >60)
GFR calc non Af Amer: >60 mL/min (ref >60)
Glucose, Bld: 100 mg/dL — ABNORMAL HIGH (ref 65–99)
Potassium: 4.2 mmol/L (ref 3.5–5.1)
Sodium: 143 mmol/L (ref 135–145)

## 2015-10-30 LAB — CBC
HCT: 40.3 % (ref 36.0–46.0)
Hemoglobin: 13.4 g/dL (ref 12.0–15.0)
MCH: 28 pg (ref 26.0–34.0)
MCHC: 33.3 g/dL (ref 30.0–36.0)
MCV: 84.3 fL (ref 78.0–100.0)
Platelets: 240 10*3/uL (ref 150–400)
RBC: 4.78 MIL/uL (ref 3.87–5.11)
RDW: 13.4 % (ref 11.5–15.5)
WBC: 7 10*3/uL (ref 4.0–10.5)

## 2015-10-30 NOTE — Progress Notes (Signed)
CT chest 08/06/2015 EKG epic 01/24/2015 LOV note pulmonary epic 10/03/2015 LOV note cardiothoracic / Dr Nils Pyle epic 08/13/2015

## 2015-10-30 NOTE — Patient Instructions (Signed)
ASHLLEY HILGART  10/30/2015   Your procedure is scheduled on: Friday November 07, 2015  Report to [facility] Main  Entrance take [place]  elevators to 3rd floor to  [place] at 8:00 AM.  Call this number if you have problems the morning of surgery [phone]   Remember: ONLY 1 PERSON MAY GO WITH YOU TO SHORT STAY TO GET  READY MORNING OF [place].  Do not eat food or drink liquids :After Midnight.     Take these medicines the morning of surgery with A SIP OF WATER: NONE                               You may not have any metal on your body including hair pins and              piercings  Do not wear jewelry, make-up, lotions, powders or perfumes, deodorant             Do not wear nail polish.  Do not shave  48 hours prior to surgery.               Do not bring valuables to the hospital. [place].  Contacts, dentures or bridgework may not be worn into surgery.      Patients discharged the day of surgery will not be allowed to drive home.  Name and phone number of your driver:Joe Denica Kaplon (husband)  _____________________________________________________________________             [facility] - Preparing for Surgery Before surgery, you can play an important role.  Because skin is not sterile, your skin needs to be as free of germs as possible.  You can reduce the number of germs on your skin by washing with CHG (chlorahexidine gluconate) soap before surgery.  CHG is an antiseptic cleaner which kills germs and bonds with the skin to continue killing germs even after washing. Please DO NOT use if you have an allergy to CHG or antibacterial soaps.  If your skin becomes reddened/irritated stop using the CHG and inform your nurse when you arrive at Short Stay. Do not shave (including legs and underarms) for at least 48 hours prior to the first CHG shower.  You may shave your face/neck. Please follow these  instructions carefully:  1.  Shower with CHG Soap the night before surgery and the  morning of Surgery.  2.  If you choose to wash your hair, wash your hair first as usual with your  normal  shampoo.  3.  After you shampoo, rinse your hair and body thoroughly to remove the  shampoo.                           4.  Use CHG as you would any other liquid soap.  You can apply chg directly  to the skin and wash                       Gently with a scrungie or clean washcloth.  5.  Apply the CHG Soap to your body ONLY FROM THE NECK DOWN.   Do not use on face/ open  Wound or open sores. Avoid contact with eyes, ears mouth and genitals (private parts).                       Wash face,  Genitals (private parts) with your normal soap.             6.  Wash thoroughly, paying special attention to the area where your surgery  will be performed.  7.  Thoroughly rinse your body with warm water from the neck down.  8.  DO NOT shower/wash with your normal soap after using and rinsing off  the CHG Soap.                9.  Pat yourself dry with a clean towel.            10.  Wear clean pajamas.            11.  Place clean sheets on your bed the night of your first shower and do not  sleep with pets. Day of Surgery : Do not apply any lotions/deodorants the morning of surgery.  Please wear clean clothes to the hospital/surgery center.  FAILURE TO FOLLOW THESE INSTRUCTIONS MAY RESULT IN THE CANCELLATION OF YOUR SURGERY PATIENT SIGNATURE_________________________________  NURSE SIGNATURE__________________________________  ________________________________________________________________________

## 2015-11-06 NOTE — H&P (Signed)
Reason For Visit    History of Present Illness       F/u - PCP Dr. Shelia Media  1 - bacteriuria - patient has had 3 urinalyses which showed mixed pyuria and hematuria September, October, and November 2014. She was told she had a "UTI". She was having no symptoms - no dysuria, gross hematuria, frequency or urgency. Two cultures grew out enterococcus. I did not see any sensitivities. She was treated with Cipro November 2014. About 2 weeks ago she developed some frequency. No urgency. These symptoms are somewhat better today. She does take probiotics.  -Nov 2014 PVR 5 ml; urine Cx enterococcus tx with NF; They travel a lot out of the country. They have not been in [place] recently.  -Dec 2014 Cysto, exam normal; urine cx enterococcus tx with NF (nausea) switched to Levaquin (developed a rash, but it may have been an allergy to a poinsetta)  [id number] pvr 11 ml    2-kidney stones - Nov 2014 - left flank pain, KUB - left kidney with stones (8, 5, 5 mm) from renal pelvis to lower pole. Multiple pelvic calcifications.; Renal US - stones appear in left kidney.  -Dec 2014 left ESWL - passed many fragments, post-op KUB revealed resolution of first two stones, LLP stone remains.  -Mar 2015 KUB - multiple stones along the left lower pole. There may be increased in size and some of the left lower pole fragments. There are 2 opacities near the left renal pelvis and one in between the transverse processes of left L2-L3.  -Jul 2015 - KUB and ultrasound - no hydronephrosis, residual stones of 3 mm, 3 mm and 5 mm in the left lower pole. -Jan 2016 KUB - stone may have been moved toward the UPJ. -Feb 2016 KUB - two LLP stones; renal u/s no hydro  -Mar 2016 - increase in LLP stones.    3-stress urinary incontinence-patient is status post bone anchor pubovaginal sling placed in 1998 by Dr. Amalia Hailey. Typically, she voids with a good stream and feels empty. No incontinence.    March 2017 interval history Patient  returns and continued management of kidney stones.   March 2017 CT scan - left renal pelvis, left lower pole with 6 mm, 10 mm and 7 mm stones. No hydronephrosis. Stones 1400 Hounsfield units. I reviewed the images today.   Past Medical History Problems  1. History of arthritis (Z87.39)  Surgical History Problems  1. History of Renal Lithotripsy  Current Meds 1. Calcium TABS;  Therapy: (Recorded:13Nov2014) to Recorded 2. CeleBREX CAPS;  Therapy: (Recorded:13Nov2014) to Recorded 3. Ciprofloxacin HCl - 500 MG Oral Tablet; TAKE 1 TABLET TWICE DAILY;  Therapy: [id number] to (Evaluate:19Feb2016)  Requested for: [id number]:033508; Last  Rx:09Feb2016 Ordered 4. [place];  Therapy: (Recorded:13Nov2014) to Recorded 5. LevoFLOXacin 500 MG Oral Tablet; TAKE 1 TABLET DAILY AS DIRECTED;  Therapy: [id number] to (Evaluate:16Jan2016)  Requested for: [id number]; Last  Rx:11Jan2016 Ordered 6. Multi-Vitamin TABS;  Therapy: (Recorded:13Nov2014) to Recorded  Allergies Medication  1. Keflex CAPS 2. Morphine Sulfate (PF) SOLN 3. Nitrofurantoin CAPS  Family History Problems  1. Family history of Death : Mother, Father 2. Family history of kidney stones (Z84.1) : Mother, Son, Sister 3. Family history of malignant neoplasm of breast (Z80.3) : Mother, Sister 4. Family history of Ovarian ca : Mother  Social History Problems  1. Alcohol use 2. Former smoker [phone]) 3. Married 4. Number of children 5. Retired  Physical Exam Constitutional: Well nourished and well developed .  No acute distress.  Neuro/Psych:. Mood and affect are appropriate.    Results/Data Urine [Data Includes: Last 1 Day]   [id number]  COLOR YELLOW   APPEARANCE CLEAR   SPECIFIC GRAVITY 1.020   pH 6.0   GLUCOSE NEGATIVE   BILIRUBIN NEGATIVE   KETONE NEGATIVE   BLOOD NEGATIVE   PROTEIN NEGATIVE   NITRITE NEGATIVE   LEUKOCYTE ESTERASE NEGATIVE    Procedure KUB-comparison to prior KUB and CT, findings: The right kidney  shadow appeared normal, the left kidney shadow contained 3 stones 2 of which were about 6 mm and close to each other in the UPJ on the left although may be one stone., Smaller 5 mm left lower pole stone. the bones and the bowel gas pattern appear normal.     Assessment Assessed  1. Calculus of kidney (N20.0)  Plan Calculus of kidney  1. Follow-up Schedule Surgery Office  Follow-up  Status: Complete  Done: [id number] Health Maintenance  2. UA With REFLEX; [Do Not Release]; Status:Complete;   [id number] 02:54PM  Discussion/Summary      Nephrolithiasis - increase in left stone burden. Discussed with patient and her husband the nature risk and benefits of continued surveillance, shockwave, ureteroscopy or PCNL. She elected to proceed with ureteroscopy. Discussed risk of bleeding infection and ureteral injury among others. We discussion may need a staged procedure and/or pre-stenting.     Signatures Electronically signed by : Festus Aloe, M.D.; Oct 24 2015  4:07PM EST

## 2015-11-07 ENCOUNTER — Ambulatory Visit (HOSPITAL_COMMUNITY)
Admission: RE | Admit: 2015-11-07 | Discharge: 2015-11-07 | Disposition: A | Discharge status: Home / Self Care | Payer: Medicare Other | Source: Ambulatory Visit | Attending: Urology | Admitting: Urology

## 2015-11-07 ENCOUNTER — Encounter (HOSPITAL_COMMUNITY)
Admission: RE | Disposition: A | Discharge status: Home / Self Care | Payer: Self-pay | Source: Ambulatory Visit | Attending: Urology

## 2015-11-07 ENCOUNTER — Encounter (HOSPITAL_COMMUNITY): Payer: Self-pay | Admitting: *Deleted

## 2015-11-07 ENCOUNTER — Ambulatory Visit (HOSPITAL_COMMUNITY): Payer: Medicare Other | Admitting: Anesthesiology

## 2015-11-07 DIAGNOSIS — I714 Abdominal aortic aneurysm, without rupture: Secondary | ICD-10-CM | POA: Diagnosis not present

## 2015-11-07 DIAGNOSIS — Z791 Long term (current) use of non-steroidal anti-inflammatories (NSAID): Secondary | ICD-10-CM | POA: Insufficient documentation

## 2015-11-07 DIAGNOSIS — Z87891 Personal history of nicotine dependence: Secondary | ICD-10-CM | POA: Insufficient documentation

## 2015-11-07 DIAGNOSIS — Z87442 Personal history of urinary calculi: Secondary | ICD-10-CM | POA: Diagnosis not present

## 2015-11-07 DIAGNOSIS — I739 Peripheral vascular disease, unspecified: Secondary | ICD-10-CM | POA: Insufficient documentation

## 2015-11-07 DIAGNOSIS — N200 Calculus of kidney: Secondary | ICD-10-CM | POA: Diagnosis not present

## 2015-11-07 DIAGNOSIS — Z79899 Other long term (current) drug therapy: Secondary | ICD-10-CM | POA: Diagnosis not present

## 2015-11-07 DIAGNOSIS — M1990 Unspecified osteoarthritis, unspecified site: Secondary | ICD-10-CM | POA: Insufficient documentation

## 2015-11-07 DIAGNOSIS — Z841 Family history of disorders of kidney and ureter: Secondary | ICD-10-CM | POA: Diagnosis not present

## 2015-11-07 HISTORY — PX: HOLMIUM LASER APPLICATION: SHX5852

## 2015-11-07 HISTORY — PX: CYSTOSCOPY WITH URETEROSCOPY AND STENT PLACEMENT: SHX6377

## 2015-11-07 SURGERY — CYSTOURETEROSCOPY, WITH STENT INSERTION
Anesthesia: General | Laterality: Left

## 2015-11-07 MED ORDER — NITROFURANTOIN MACROCRYSTAL 50 MG PO CAPS: 50.0000 mg | ORAL_CAPSULE | Freq: Every day | ORAL | Status: DC

## 2015-11-07 MED ORDER — 0.9 % SODIUM CHLORIDE (POUR BTL) OPTIME
TOPICAL | Status: DC | PRN
  Administered 2015-11-07: 1000 mL

## 2015-11-07 MED ORDER — DEXAMETHASONE SODIUM PHOSPHATE 10 MG/ML IJ SOLN
INTRAMUSCULAR | Status: AC
  Filled 2015-11-07: qty 1

## 2015-11-07 MED ORDER — EPHEDRINE SULFATE 50 MG/ML IJ SOLN
INTRAMUSCULAR | Status: DC | PRN
  Administered 2015-11-07: 10 mg via INTRAVENOUS
  Administered 2015-11-07: 5 mg via INTRAVENOUS
  Administered 2015-11-07: 10 mg via INTRAVENOUS
  Administered 2015-11-07: 5 mg via INTRAVENOUS

## 2015-11-07 MED ORDER — PROPOFOL 10 MG/ML IV BOLUS
INTRAVENOUS | Status: DC | PRN
  Administered 2015-11-07: 110 mg via INTRAVENOUS
  Administered 2015-11-07: 40 mg via INTRAVENOUS

## 2015-11-07 MED ORDER — LIDOCAINE HCL 2 % EX GEL
CUTANEOUS | Status: AC
  Filled 2015-11-07: qty 5

## 2015-11-07 MED ORDER — SODIUM CHLORIDE 0.9 % IJ SOLN
INTRAMUSCULAR | Status: AC
  Filled 2015-11-07: qty 10

## 2015-11-07 MED ORDER — TRAMADOL HCL 50 MG PO TABS: 50.0000 mg | ORAL_TABLET | Freq: Four times a day (QID) | ORAL | Status: DC | PRN

## 2015-11-07 MED ORDER — BELLADONNA ALKALOIDS-OPIUM 16.2-60 MG RE SUPP
RECTAL | Status: DC | PRN
  Administered 2015-11-07: 1 via RECTAL

## 2015-11-07 MED ORDER — EPHEDRINE SULFATE 50 MG/ML IJ SOLN
INTRAMUSCULAR | Status: AC
  Filled 2015-11-07: qty 1

## 2015-11-07 MED ORDER — BELLADONNA ALKALOIDS-OPIUM 16.2-60 MG RE SUPP
RECTAL | Status: AC
  Filled 2015-11-07: qty 1

## 2015-11-07 MED ORDER — PROPOFOL 10 MG/ML IV BOLUS
INTRAVENOUS | Status: AC
  Filled 2015-11-07: qty 20

## 2015-11-07 MED ORDER — SODIUM CHLORIDE 0.9 % IR SOLN
Status: DC | PRN
  Administered 2015-11-07: 1500 mL

## 2015-11-07 MED ORDER — LEVOFLOXACIN IN D5W 500 MG/100ML IV SOLN
500.0000 mg | INTRAVENOUS | Status: AC
  Administered 2015-11-07: 500 mg via INTRAVENOUS
  Filled 2015-11-07: qty 100

## 2015-11-07 MED ORDER — ONDANSETRON HCL 4 MG/2ML IJ SOLN: 4.0000 mg | Freq: Once | INTRAMUSCULAR | Status: DC | PRN

## 2015-11-07 MED ORDER — LACTATED RINGERS IV SOLN
INTRAVENOUS | Status: DC | PRN
  Administered 2015-11-07 (×2): via INTRAVENOUS

## 2015-11-07 MED ORDER — IOPAMIDOL (ISOVUE-300) INJECTION 61%
INTRAVENOUS | Status: AC
  Filled 2015-11-07: qty 50

## 2015-11-07 MED ORDER — LIDOCAINE HCL (CARDIAC) 20 MG/ML IV SOLN
INTRAVENOUS | Status: AC
  Filled 2015-11-07: qty 5

## 2015-11-07 MED ORDER — FENTANYL CITRATE (PF) 100 MCG/2ML IJ SOLN
INTRAMUSCULAR | Status: AC
  Filled 2015-11-07: qty 2

## 2015-11-07 MED ORDER — ONDANSETRON HCL 4 MG/2ML IJ SOLN
INTRAMUSCULAR | Status: DC | PRN
  Administered 2015-11-07 (×4): 2 mg via INTRAVENOUS

## 2015-11-07 MED ORDER — FENTANYL CITRATE (PF) 100 MCG/2ML IJ SOLN
INTRAMUSCULAR | Status: DC | PRN
  Administered 2015-11-07 (×2): 50 ug via INTRAVENOUS

## 2015-11-07 MED ORDER — DEXAMETHASONE SODIUM PHOSPHATE 10 MG/ML IJ SOLN
INTRAMUSCULAR | Status: DC | PRN
  Administered 2015-11-07: 5 mg via INTRAVENOUS

## 2015-11-07 MED ORDER — IOPAMIDOL (ISOVUE-300) INJECTION 61%
INTRAVENOUS | Status: DC | PRN
  Administered 2015-11-07: 7 mL via URETHRAL

## 2015-11-07 MED ORDER — ONDANSETRON HCL 4 MG/2ML IJ SOLN
INTRAMUSCULAR | Status: AC
  Filled 2015-11-07: qty 4

## 2015-11-07 MED ORDER — FENTANYL CITRATE (PF) 100 MCG/2ML IJ SOLN: 25.0000 ug | INTRAMUSCULAR | Status: DC | PRN

## 2015-11-07 MED ORDER — LIDOCAINE HCL (CARDIAC) 20 MG/ML IV SOLN
INTRAVENOUS | Status: DC | PRN
  Administered 2015-11-07: 40 mg via INTRAVENOUS
  Administered 2015-11-07: 60 mg via INTRAVENOUS

## 2015-11-07 MED ORDER — LIDOCAINE HCL 2 % EX GEL
CUTANEOUS | Status: DC | PRN
  Administered 2015-11-07: 1 via URETHRAL

## 2015-11-07 SURGICAL SUPPLY — 23 items
BAG URO CATCHER STRL LF (MISCELLANEOUS) ×3 IMPLANT
BASKET ZERO TIP NITINOL 2.4FR (BASKET) IMPLANT
CATH INTERMIT  6FR 70CM (CATHETERS) IMPLANT
CATH URET 5FR 28IN CONE TIP (BALLOONS)
CATH URET 5FR 70CM CONE TIP (BALLOONS) IMPLANT
CATH URET WHISTLE 6FR (CATHETERS) IMPLANT
CLOTH BEACON ORANGE TIMEOUT ST (SAFETY) ×3 IMPLANT
FIBER LASER FLEXIVA 1000 (UROLOGICAL SUPPLIES) IMPLANT
FIBER LASER FLEXIVA 200 (UROLOGICAL SUPPLIES) IMPLANT
FIBER LASER FLEXIVA 365 (UROLOGICAL SUPPLIES) IMPLANT
FIBER LASER FLEXIVA 550 (UROLOGICAL SUPPLIES) IMPLANT
FIBER LASER TRAC TIP (UROLOGICAL SUPPLIES) IMPLANT
GLOVE BIO SURGEON STRL SZ7.5 (GLOVE) ×3 IMPLANT
GOWN STRL REUS W/TWL XL LVL3 (GOWN DISPOSABLE) ×3 IMPLANT
GUIDEWIRE STR DUAL SENSOR (WIRE) ×3 IMPLANT
MANIFOLD NEPTUNE II (INSTRUMENTS) ×3 IMPLANT
PACK CYSTO (CUSTOM PROCEDURE TRAY) ×3 IMPLANT
SHEATH ACCESS URETERAL 24CM (SHEATH) IMPLANT
SHEATH ACCESS URETERAL 38CM (SHEATH) ×3 IMPLANT
STENT URET 6FRX24 CONTOUR (STENTS) IMPLANT
TUBING CONNECTING 10 (TUBING) ×2 IMPLANT
TUBING CONNECTING 10' (TUBING) ×1
WIRE COONS/BENSON .038X145CM (WIRE) ×3 IMPLANT

## 2015-11-07 NOTE — Discharge Instructions (Signed)
Ureteral Stent Implantation, Care After Refer to this sheet in the next few weeks. These instructions provide you with information on caring for yourself after your procedure. Your health care provider may also give you more specific instructions. Your treatment has been planned according to current medical practices, but problems sometimes occur. Call your health care provider if you have any problems or questions after your procedure. WHAT TO EXPECT AFTER THE PROCEDURE You should be back to normal activity within 48 hours after the procedure. Nausea and vomiting may occur and are commonly the result of anesthesia. It is common to experience sharp pain in the back or lower abdomen and penis with voiding. This is caused by movement of the ends of the stent with the act of urinating.It usually goes away within minutes after you have stopped urinating. HOME CARE INSTRUCTIONS Make sure to drink plenty of fluids. You may have small amounts of bleeding, causing your urine to be red. This is normal. Certain movements may trigger pain or a feeling that you need to urinate. You may be given medicines to prevent infection or bladder spasms. Be sure to take all medicines as directed. Only take over-the-counter or prescription medicines for pain, discomfort, or fever as directed by your health care provider. Do not take aspirin, as this can make bleeding worse.  Your stent will be left in until the stones are removed in 2-3 weeks. Be sure to keep all follow-up appointments so your health care provider can check that you are healing properly.  SEEK MEDICAL CARE IF:  You experience increasing pain.  Your pain medicine is not working. SEEK IMMEDIATE MEDICAL CARE IF:  Your urine is dark red or has blood clots.  You are leaking urine (incontinent).  You have a fever, chills, feeling sick to your stomach (nausea), or vomiting.  Your pain is not relieved by pain medicine.  The end of the stent comes out of  the urethra.  You are unable to urinate.   This information is not intended to replace advice given to you by your health care provider. Make sure you discuss any questions you have with your health care provider.   Document Released: 03/28/2013 Document Revised: 07/31/2013 Document Reviewed: 02/07/2015 Elsevier Interactive Patient Education 2016 [place] Anesthesia, Adult, Care After Refer to this sheet in the next few weeks. These instructions provide you with information on caring for yourself after your procedure. Your health care provider may also give you more specific instructions. Your treatment has been planned according to current medical practices, but problems sometimes occur. Call your health care provider if you have any problems or questions after your procedure. WHAT TO EXPECT AFTER THE PROCEDURE After the procedure, it is typical to experience:  Sleepiness.  Nausea and vomiting. HOME CARE INSTRUCTIONS  For the first 24 hours after general anesthesia:  Have a responsible person with you.  Do not drive a car. If you are alone, do not take public transportation.  Do not drink alcohol.  Do not take medicine that has not been prescribed by your health care provider.  Do not sign important papers or make important decisions.  You may resume a normal diet and activities as directed by your health care provider.  Change bandages (dressings) as directed.  If you have questions or problems that seem related to general anesthesia, call the hospital and ask for the anesthetist or anesthesiologist on call. SEEK MEDICAL CARE IF:  You have nausea and vomiting that continue the  day after anesthesia.  You develop a rash. SEEK IMMEDIATE MEDICAL CARE IF:   You have difficulty breathing.  You have chest pain.  You have any allergic problems.   This information is not intended to replace advice given to you by your health care provider. Make sure you discuss  any questions you have with your health care provider.   Document Released: 11/01/2000 Document Revised: 08/16/2014 Document Reviewed: 11/24/2011 Elsevier Interactive Patient Education [employer].

## 2015-11-07 NOTE — Op Note (Signed)
Preoperative diagnosis left renal stones Postoperative diagnosis: Same  Procedure: Cystoscopy, left retrograde pyelogram, left ureteroscopy, left ureteral stent placement  Surgeon: Junious Silk  Anesthesia: Gen.  Indication for procedure: 80 year old with 3 stones in the left kidney/renal pelvis who desired definitive stone treatment.  Findings: On cystoscopy the bladder was unremarkable without stone or foreign body or tumor.  Left retrograde pyelogram-this outlined a single ureter single collecting system unit with physiologic narrowing in the UPJ, 3 filling defects in the renal pelvis consistent with the no stones and a normal collecting system without dilation.  On ureteroscopy the UPJ was quite tight, just allow the Glidewire to pass but would not allow a single-lumen digital ureteroscope.  Description of procedure: After consent was obtained patient brought to the operating room. After adequate anesthesia she was placed in lithotomy position and prepped and draped in the usual sterile fashion. A timeout was performed to confirm the patient and procedure. The cystoscope was passed per urethra the bladder inspected. The left ureteral orifice was cannulated with a 6 [place] open-ended catheter and retrograde injection of contrast was performed. A sensor wire was then advanced and coiled in the kidney. I then advanced the inner sheath of an access sheath without difficulty into the proximal ureter. The access sheath was then advanced and I used it to place a Glidewire and then passed it over the sensor wire to leave the glide wires a safety wire. The access sheath only advanced into the proximal ureter and no further. The single-channel digital ureteroscope was then advanced but the UPJ was obviously quite tight as evidenced on the retrograde. I tried to gently dilated with the scope and then tried to slide the scope over the sensor wire which I repassed through the scope. There was quite a bit of  resistance therefore thought it was best to leave a stent given the stone burden in the extraction that will be needed as well. The ureteroscope and access sheath were backed out together and the ureter was noted to be normal although in the distal ureter the mucosa was just beginning to shear a small area. The wire was backloaded on cystoscope and a 6 x 24 cm stent was advanced but the stent was too short with the proximal coil in the proximal ureter. Therefore the stent was grasped and brought through the urethral meatus and I repassed the sensor wire and coiled in in the upper pole collecting system. A 6 x 26 cm stent was now advanced and the wire was removed with a good coil seen in the collecting system and a good coil in the bladder. The bladder was drained and the scope removed. Lidocaine jelly was instilled per urethra and I placed a B&O suppository. She was awakened taken to recovery room in stable condition.  Complications: None  Blood loss: Minimal  Specimens: None  Drains: 6 x 26 cm left ureteral stent

## 2015-11-07 NOTE — Transfer of Care (Signed)
Immediate Anesthesia Transfer of Care Note  Patient: Tina Bell  Procedure(s) Performed: Procedure(s): CYSTOSCOPY LEFT URETEROSCOPY WITH LEFT RETROGRADE PYELOGRAM (Left) LEFT STENT PLACEMENT (Left)  Patient Location: PACU  Anesthesia Type:General  Level of Consciousness: awake, sedated and responds to stimulation  Airway & Oxygen Therapy: Patient Spontanous Breathing and Patient connected to face mask oxygen  Post-op Assessment: Report given to RN and Post -op Vital signs reviewed and stable  Post vital signs: Reviewed and stable  Last Vitals:  Filed Vitals:   11/07/15 0749  BP: 150/78  Pulse: 73  Temp: 36.3 C  Resp: 16    Complications: No apparent anesthesia complications

## 2015-11-07 NOTE — Anesthesia Procedure Notes (Signed)
Procedure Name: LMA Insertion Date/Time: 11/07/2015 10:40 AM Performed by: Freddie Breech Pre-anesthesia Checklist: Patient identified, Emergency Drugs available, Suction available, Patient being monitored and Timeout performed Patient Re-evaluated:Patient Re-evaluated prior to inductionOxygen Delivery Method: Circle system utilized Preoxygenation: Pre-oxygenation with 100% oxygen Intubation Type: IV induction LMA: LMA inserted LMA Size: 3.0 Number of attempts: 1 Placement Confirmation: positive ETCO2,  CO2 detector and breath sounds checked- equal and bilateral Tube secured with: Tape Dental Injury: Teeth and Oropharynx as per pre-operative assessment

## 2015-11-07 NOTE — Interval H&P Note (Signed)
History and Physical Interval Note:  11/07/2015 10:09 AM  Tina Bell  has presented today for surgery, with the diagnosis of LEFT RENAL STONE   The various methods of treatment have been discussed with the patient and family. After consideration of risks, benefits and other options for treatment, the patient has consented to  Procedure(s): CYSTOSCOPY LEFT URETEROSCOPY   (Left) HOLMIUM LASER LITHOTRIPSY  AND STENT PLACEMENT (Left) as a surgical intervention .  The patient's history has been reviewed, patient examined, no change in status, stable for surgery.  I have reviewed the patient's chart and labs.  Questions were answered to the patient's satisfaction.  She has been well. No dysuria or fever. Discussed possible staged procedure given proximal nature of stones and stone burden.    Janae Bonser

## 2015-11-07 NOTE — Anesthesia Postprocedure Evaluation (Signed)
Anesthesia Post Note  Patient: KEVIANA BALSAM  Procedure(s) Performed: Procedure(s) (LRB): CYSTOSCOPY LEFT URETEROSCOPY WITH LEFT RETROGRADE PYELOGRAM (Left) LEFT STENT PLACEMENT (Left)  Patient location during evaluation: PACU Anesthesia Type: General Level of consciousness: awake and alert Pain management: pain level controlled Vital Signs Assessment: post-procedure vital signs reviewed and stable Respiratory status: spontaneous breathing, nonlabored ventilation, respiratory function stable and patient connected to nasal cannula oxygen Cardiovascular status: blood pressure returned to baseline and stable Postop Assessment: no signs of nausea or vomiting Anesthetic complications: no    Last Vitals:  Filed Vitals:   11/07/15 1216 11/07/15 1308  BP: 157/93 154/90  Pulse: 89   Temp: 36.4 C 36.7 C  Resp: 16 19    Last Pain:  Filed Vitals:   11/07/15 1309  PainSc: 0-No pain                 Catalina Gravel

## 2015-11-07 NOTE — Anesthesia Preprocedure Evaluation (Addendum)
Anesthesia Evaluation  Patient identified by MRN, date of birth, ID band Patient awake    Reviewed: Allergy & Precautions, NPO status , Patient's Chart, lab work & pertinent test results  History of Anesthesia Complications (+) PONV, PROLONGED EMERGENCE and history of anesthetic complications  Airway Mallampati: II  TM Distance: >3 FB Neck ROM: Full    Dental  (+) Teeth Intact, Dental Advisory Given,    Pulmonary former smoker,    Pulmonary exam normal breath sounds clear to auscultation       Cardiovascular + Peripheral Vascular Disease (AAA)  Normal cardiovascular exam Rhythm:Regular Rate:Normal     Neuro/Psych negative neurological ROS  negative psych ROS   GI/Hepatic negative GI ROS, Neg liver ROS,   Endo/Other  negative endocrine ROS  Renal/GU negative Renal ROS     Musculoskeletal  (+) Arthritis , Osteoarthritis,    Abdominal   Peds  Hematology  (+) Blood dyscrasia, anemia ,   Anesthesia Other Findings Day of surgery medications reviewed with the patient.  Reproductive/Obstetrics                            Anesthesia Physical Anesthesia Plan  ASA: III  Anesthesia Plan: General   Post-op Pain Management:    Induction: Intravenous  Airway Management Planned: LMA  Additional Equipment:   Intra-op Plan:   Post-operative Plan: Extubation in OR  Informed Consent: I have reviewed the patients History and Physical, chart, labs and discussed the procedure including the risks, benefits and alternatives for the proposed anesthesia with the patient or authorized representative who has indicated his/her understanding and acceptance.   Dental advisory given  Plan Discussed with: CRNA  Anesthesia Plan Comments: (Risks/benefits of general anesthesia discussed with patient including risk of damage to teeth, lips, gum, and tongue, nausea/vomiting, allergic reactions to medications,  and the possibility of heart attack, stroke and death.  All patient questions answered.  Patient wishes to proceed.)        Anesthesia Quick Evaluation

## 2015-11-10 ENCOUNTER — Encounter (HOSPITAL_COMMUNITY): Payer: Self-pay | Admitting: Urology

## 2015-11-10 ENCOUNTER — Other Ambulatory Visit: Payer: Self-pay | Admitting: Urology

## 2015-11-24 ENCOUNTER — Encounter (HOSPITAL_BASED_OUTPATIENT_CLINIC_OR_DEPARTMENT_OTHER): Payer: Self-pay | Admitting: *Deleted

## 2015-11-24 NOTE — Progress Notes (Signed)
NPO AFTER MN.  ARRIVE AT 0930.  CURRENT LAB RESULTS AND CHEST CT IN CHART AND EPIC.  MAY TAKE TRAMADOL AM DOS W/ SIPS OF WATER .

## 2015-11-28 ENCOUNTER — Ambulatory Visit (HOSPITAL_BASED_OUTPATIENT_CLINIC_OR_DEPARTMENT_OTHER): Payer: Medicare Other | Admitting: Anesthesiology

## 2015-11-28 ENCOUNTER — Encounter (HOSPITAL_BASED_OUTPATIENT_CLINIC_OR_DEPARTMENT_OTHER)
Admission: RE | Disposition: A | Discharge status: Home / Self Care | Payer: Self-pay | Source: Ambulatory Visit | Attending: Urology

## 2015-11-28 ENCOUNTER — Encounter (HOSPITAL_BASED_OUTPATIENT_CLINIC_OR_DEPARTMENT_OTHER): Payer: Self-pay | Admitting: *Deleted

## 2015-11-28 ENCOUNTER — Ambulatory Visit (HOSPITAL_BASED_OUTPATIENT_CLINIC_OR_DEPARTMENT_OTHER)
Admission: RE | Admit: 2015-11-28 | Discharge: 2015-11-28 | Disposition: A | Discharge status: Home / Self Care | Payer: Medicare Other | Source: Ambulatory Visit | Attending: Urology | Admitting: Urology

## 2015-11-28 DIAGNOSIS — I739 Peripheral vascular disease, unspecified: Secondary | ICD-10-CM | POA: Insufficient documentation

## 2015-11-28 DIAGNOSIS — M170 Bilateral primary osteoarthritis of knee: Secondary | ICD-10-CM | POA: Diagnosis not present

## 2015-11-28 DIAGNOSIS — Z87442 Personal history of urinary calculi: Secondary | ICD-10-CM | POA: Diagnosis not present

## 2015-11-28 DIAGNOSIS — N200 Calculus of kidney: Secondary | ICD-10-CM | POA: Diagnosis not present

## 2015-11-28 DIAGNOSIS — Z79899 Other long term (current) drug therapy: Secondary | ICD-10-CM | POA: Diagnosis not present

## 2015-11-28 DIAGNOSIS — Z96652 Presence of left artificial knee joint: Secondary | ICD-10-CM | POA: Insufficient documentation

## 2015-11-28 DIAGNOSIS — K449 Diaphragmatic hernia without obstruction or gangrene: Secondary | ICD-10-CM | POA: Insufficient documentation

## 2015-11-28 DIAGNOSIS — Z87891 Personal history of nicotine dependence: Secondary | ICD-10-CM | POA: Insufficient documentation

## 2015-11-28 HISTORY — PX: HOLMIUM LASER APPLICATION: SHX5852

## 2015-11-28 HISTORY — DX: Preglaucoma, unspecified, unspecified eye: H40.009

## 2015-11-28 HISTORY — DX: Thoracic aortic aneurysm, without rupture: I71.2

## 2015-11-28 HISTORY — PX: STONE EXTRACTION WITH BASKET: SHX5318

## 2015-11-28 HISTORY — DX: Diaphragmatic hernia without obstruction or gangrene: K44.9

## 2015-11-28 HISTORY — DX: Personal history of other diseases of the respiratory system: Z87.09

## 2015-11-28 HISTORY — DX: Aneurysm of the ascending aorta, without rupture: I71.21

## 2015-11-28 HISTORY — DX: Urgency of urination: R39.15

## 2015-11-28 HISTORY — PX: CYSTOSCOPY/URETEROSCOPY/HOLMIUM LASER/STENT PLACEMENT: SHX6546

## 2015-11-28 HISTORY — DX: Personal history of other infectious and parasitic diseases: Z86.19

## 2015-11-28 HISTORY — DX: Personal history of urinary (tract) infections: Z87.440

## 2015-11-28 SURGERY — CYSTOSCOPY/URETEROSCOPY/HOLMIUM LASER/STENT PLACEMENT
Anesthesia: General | Site: Renal | Laterality: Left

## 2015-11-28 MED ORDER — LEVOFLOXACIN IN D5W 500 MG/100ML IV SOLN
INTRAVENOUS | Status: AC
  Filled 2015-11-28: qty 100

## 2015-11-28 MED ORDER — SULFAMETHOXAZOLE-TRIMETHOPRIM 800-160 MG PO TABS: 1.0000 | ORAL_TABLET | Freq: Every day | ORAL | Status: DC

## 2015-11-28 MED ORDER — LIDOCAINE HCL (CARDIAC) 20 MG/ML IV SOLN
INTRAVENOUS | Status: DC | PRN
  Administered 2015-11-28: 80 mg via INTRAVENOUS

## 2015-11-28 MED ORDER — FENTANYL CITRATE (PF) 100 MCG/2ML IJ SOLN
INTRAMUSCULAR | Status: DC | PRN
  Administered 2015-11-28 (×4): 25 ug via INTRAVENOUS

## 2015-11-28 MED ORDER — LACTATED RINGERS IV SOLN
INTRAVENOUS | Status: DC
  Administered 2015-11-28: 10:00:00 via INTRAVENOUS
  Filled 2015-11-28: qty 1000

## 2015-11-28 MED ORDER — LEVOFLOXACIN IN D5W 500 MG/100ML IV SOLN
500.0000 mg | INTRAVENOUS | Status: DC
  Filled 2015-11-28: qty 100

## 2015-11-28 MED ORDER — DEXAMETHASONE SODIUM PHOSPHATE 4 MG/ML IJ SOLN
INTRAMUSCULAR | Status: DC | PRN
  Administered 2015-11-28: 10 mg via INTRAVENOUS

## 2015-11-28 MED ORDER — PROMETHAZINE HCL 25 MG/ML IJ SOLN
6.2500 mg | INTRAMUSCULAR | Status: DC | PRN
  Filled 2015-11-28: qty 1

## 2015-11-28 MED ORDER — ONDANSETRON HCL 4 MG/2ML IJ SOLN
INTRAMUSCULAR | Status: DC | PRN
  Administered 2015-11-28: 4 mg via INTRAVENOUS

## 2015-11-28 MED ORDER — PROPOFOL 10 MG/ML IV BOLUS
INTRAVENOUS | Status: DC | PRN
  Administered 2015-11-28: 50 mg via INTRAVENOUS
  Administered 2015-11-28: 150 mg via INTRAVENOUS

## 2015-11-28 MED ORDER — KETOROLAC TROMETHAMINE 30 MG/ML IJ SOLN
INTRAMUSCULAR | Status: DC | PRN
  Administered 2015-11-28: 30 mg via INTRAVENOUS

## 2015-11-28 MED ORDER — FENTANYL CITRATE (PF) 100 MCG/2ML IJ SOLN
INTRAMUSCULAR | Status: AC
  Filled 2015-11-28: qty 2

## 2015-11-28 MED ORDER — SODIUM CHLORIDE 0.9 % IR SOLN
Status: DC | PRN
  Administered 2015-11-28: 4000 mL

## 2015-11-28 MED ORDER — PROPOFOL 10 MG/ML IV BOLUS
INTRAVENOUS | Status: AC
  Filled 2015-11-28: qty 20

## 2015-11-28 MED ORDER — FENTANYL CITRATE (PF) 100 MCG/2ML IJ SOLN
25.0000 ug | INTRAMUSCULAR | Status: DC | PRN
  Filled 2015-11-28: qty 1

## 2015-11-28 MED ORDER — LACTATED RINGERS IV SOLN
INTRAVENOUS | Status: DC
  Filled 2015-11-28: qty 1000

## 2015-11-28 SURGICAL SUPPLY — 44 items
ADAPTER CATH URET PLST 4-6FR (CATHETERS) IMPLANT
BAG DRAIN URO-CYSTO SKYTR STRL (DRAIN) ×2 IMPLANT
BASKET LASER NITINOL 1.9FR (BASKET) IMPLANT
BASKET STNLS GEMINI 4WIRE 3FR (BASKET) IMPLANT
BASKET ZERO TIP NITINOL 2.4FR (BASKET) ×2 IMPLANT
CANISTER SUCT LVC 12 LTR MEDI- (MISCELLANEOUS) IMPLANT
CATH INTERMIT  6FR 70CM (CATHETERS) IMPLANT
CATH URET 5FR 28IN CONE TIP (BALLOONS)
CATH URET 5FR 28IN OPEN ENDED (CATHETERS) IMPLANT
CATH URET 5FR 70CM CONE TIP (BALLOONS) IMPLANT
CATH URET DUAL LUMEN 6-10FR 50 (CATHETERS) IMPLANT
CLOTH BEACON ORANGE TIMEOUT ST (SAFETY) ×2 IMPLANT
ELECT REM PT RETURN 9FT ADLT (ELECTROSURGICAL)
ELECTRODE REM PT RTRN 9FT ADLT (ELECTROSURGICAL) IMPLANT
FIBER LASER TRAC TIP (UROLOGICAL SUPPLIES) ×2 IMPLANT
GLOVE BIO SURGEON STRL SZ 6.5 (GLOVE) ×2 IMPLANT
GLOVE BIO SURGEON STRL SZ7.5 (GLOVE) ×2 IMPLANT
GLOVE BIOGEL PI IND STRL 6.5 (GLOVE) ×1 IMPLANT
GLOVE BIOGEL PI IND STRL 7.0 (GLOVE) ×1 IMPLANT
GLOVE BIOGEL PI INDICATOR 6.5 (GLOVE) ×1
GLOVE BIOGEL PI INDICATOR 7.0 (GLOVE) ×1
GLOVE SURG SS PI 7.0 STRL IVOR (GLOVE) ×2 IMPLANT
GLOVE SURG SS PI 7.5 STRL IVOR (GLOVE) ×2 IMPLANT
GOWN STRL REUS W/ TWL LRG LVL3 (GOWN DISPOSABLE) ×3 IMPLANT
GOWN STRL REUS W/ TWL XL LVL3 (GOWN DISPOSABLE) ×1 IMPLANT
GOWN STRL REUS W/TWL LRG LVL3 (GOWN DISPOSABLE) ×3
GOWN STRL REUS W/TWL XL LVL3 (GOWN DISPOSABLE) ×1
GUIDEWIRE 0.038 PTFE COATED (WIRE) IMPLANT
GUIDEWIRE ANG ZIPWIRE 038X150 (WIRE) ×2 IMPLANT
GUIDEWIRE STR DUAL SENSOR (WIRE) ×2 IMPLANT
IV NS 1000ML (IV SOLUTION) ×1
IV NS 1000ML BAXH (IV SOLUTION) ×1 IMPLANT
IV NS IRRIG 3000ML ARTHROMATIC (IV SOLUTION) ×2 IMPLANT
KIT BALLIN UROMAX 15FX10 (LABEL) IMPLANT
KIT BALLN UROMAX 15FX4 (MISCELLANEOUS) IMPLANT
KIT BALLN UROMAX 26 75X4 (MISCELLANEOUS)
KIT ROOM TURNOVER WOR (KITS) ×2 IMPLANT
MANIFOLD NEPTUNE II (INSTRUMENTS) ×2 IMPLANT
PACK CYSTO (CUSTOM PROCEDURE TRAY) ×2 IMPLANT
SET HIGH PRES BAL DIL (LABEL)
SHEATH ACCESS URETERAL 38CM (SHEATH) IMPLANT
STENT URET 6FRX26 CONTOUR (STENTS) ×2 IMPLANT
SYRINGE IRR TOOMEY STRL 70CC (SYRINGE) IMPLANT
TUBE CONNECTING 12X1/4 (SUCTIONS) IMPLANT

## 2015-11-28 NOTE — Op Note (Addendum)
Pre-op diagnosis: left renal stones  Post-op diagnosis: left renal stones  Procedure: cystoscopy, left ureteroscopy, holmium laser lithotripsy, stone basket extraction, ureteral stent exchange with string.  Surgeon: Samara Deist: Gen  Indication: Significant left lower pole stone burden patient brought for left ureteroscopy.  Prior stent was placed.  Findings:3 large stones in the left lower pole.  Fragmented well.  Largest pieces removed.  Description of procedure; after consent was obtained patient was brought to the operating room.  After adequate anesthesia she was placed in lithotomy position and prepped and draped in the usual sterile fashion.  A timeout was performed to confirm the patient and procedure.  The cystoscope was passed per urethra and the left ureteral stent was grasped and removed through the urethral meatus.  A sensor wire was advanced and coiled in the kidney.A second Glidewire was advanced and also coiled in the collecting system.  A ureteral access sheath was advanced without difficulty into the proximal ureter.  The dual-lumen digital ureteroscope was advanced into the kidney.  The calyces were inspected and the 3 large lower pole stones noted.  The lower pole was quite an angle on retroflex therefore a Nitinol basket was used to grasp the medium size stone and place it in the upper pole.  At a setting of 0.2 and 50 with a 200 micron laser fiber the stones were fragmented and dusted.  The stones fragmented quite well.  The largest stone was too big to get out of the lower pole and I whittled it down with the laser then grasp it and dropped in the upper pole as well.  It was dusted.  He then grabbed the last stone out of the lower pole and dropped in the upper pole and again dusted it with the laser.  As I said I was quite pleased with the fragmentation.  Nitinol basket was used to grasp- the largest fragments which were about 2 mm.  No other stones were visible on  fluoroscopy.  The 3 stones wre visible at the beginning. A careful inspection of the collecting system noted no other significant fragments or stones.  The pelvis appeared normal.  The proximal ureter appeared normal.  The access sheath and the scope were backed out together in the ureter inspected and noted to be normal.  The wire was backloaded on the cystoscope and a 6 x 26 cm ureteral stent was advanced.  The wire was removed with a good coil seen in the kidney and a good coil in the bladder.  She was awakened and taken to the recovery room in stable condition.  EBL: < 3 ml  Complication: none  Drains: 6 x 26 cm left ureteral stent with STRING  Specimens: Stone frags to office lab

## 2015-11-28 NOTE — Anesthesia Postprocedure Evaluation (Signed)
Anesthesia Post Note  Patient: Tina Bell  Procedure(s) Performed: Procedure(s) (LRB): CYSTOSCOPY LEFT /URETEROSCOPY/HOLMIUM LASER/STENT REPLACEMENT (Left) HOLMIUM LASER APPLICATION (Left) STONE EXTRACTION WITH BASKET (Left)  Patient location during evaluation: PACU Anesthesia Type: General Level of consciousness: awake and alert Pain management: pain level controlled Vital Signs Assessment: post-procedure vital signs reviewed and stable Respiratory status: spontaneous breathing, nonlabored ventilation, respiratory function stable and patient connected to nasal cannula oxygen Cardiovascular status: blood pressure returned to baseline and stable Postop Assessment: no signs of nausea or vomiting Anesthetic complications: no    Last Vitals:  Filed Vitals:   11/28/15 1330 11/28/15 1345  BP: 145/70 147/87  Pulse: 65 68  Temp:    Resp: 12 13    Last Pain:  Filed Vitals:   11/28/15 1350  PainSc: 3                  Effie Berkshire

## 2015-11-28 NOTE — H&P (Signed)
H&P  Chief Complaint: Left renal stones status post left ureteral stent  History of Present Illness: 80 year old the left ureteral stone and presents for definitive left stone management of 3 stones in her left renal pelvis, lower pole. She has been well with typical stent pain but had no significant dysuria. She's had no fever.  Past Medical History  Diagnosis Date  . History of kidney stones [id number]  . Arthritis     knees  . Bronchiectasis ([place])     pulmologist-  dr Lake Bells--   (  per note secondary to severe pertussis as child)   stable per LOV note 10-03-2015  . Thoracic ascending aortic aneurysm ([facility])     4 cm   per last ct 08-06-2015  (consult by dr Lucianne Lei tright 08-13-2015)  . H/O pleural empyema     w/ CAP  June 2016  s/p   VAT's with thoracotomy drainage empyema and decortication right lower lobe  . History of recurrent UTIs   . Renal calculus, left   . History of kidney stones   . History of pertussis as a child     severe   . Hiatal hernia   . PONV (postoperative nausea and vomiting)     and difficulty waking up  . Urgency of urination   . Wears glasses   . Borderline glaucoma     right eye   Past Surgical History  Procedure Laterality Date  . Knee arthroscopy Right 1999  . Video assisted thoracoscopy (vats)/empyema Right 01/06/2015    Procedure: VIDEO ASSISTED THORACOSCOPY (VATS)/EMPYEMA AND DRAINAGE OF EMPYEMA;  Surgeon: Ivin Poot, MD;  Location: [facility];  Service: Thoracic;  Laterality: Right;  . Total knee arthroplasty Left 05/12/2015    Procedure: LEFT TOTAL KNEE ARTHROPLASTY;  Surgeon: Gaynelle Arabian, MD;  Location: [facility];  Service: Orthopedics;  Laterality: Left;  . Cystoscopy with ureteroscopy and stent placement Left 11/07/2015    Procedure: CYSTOSCOPY LEFT URETEROSCOPY WITH LEFT RETROGRADE PYELOGRAM;  Surgeon: Festus Aloe, MD;  Location: [facility];  Service: Urology;  Laterality: Left;  . Holmium laser application Left [id number]    Procedure: LEFT STENT  PLACEMENT;  Surgeon: Festus Aloe, MD;  Location: [facility];  Service: Urology;  Laterality: Left;  . Pubovaginal sling  1998    bone anchor  . Extracorporeal shock wave lithotripsy  07-09-2013  . Dilation and curettage of uterus  x3  1980's  . Nasal septum surgery  1989  . Tubal ligation  1974  . Cataract extraction w/ intraocular lens implant Left March 2017    Home Medications:  Prescriptions prior to admission  Medication Sig Dispense Refill Last Dose  . brimonidine (ALPHAGAN P) 0.1 % SOLN Place 1 drop into the right eye 3 (three) times daily.   11/28/2015 at [place]  . Calcium Carb-Cholecalciferol (CALCIUM 600 + D PO) Take 1 tablet by mouth 2 (two) times daily.   Past Week at Unknown time  . KRILL OIL PO Take 1 tablet by mouth daily.   Past Month at Unknown time  . Multiple Vitamin (MULTIVITAMIN WITH MINERALS) TABS tablet Take 1 tablet by mouth daily.   Past Month at Unknown time  . nitrofurantoin (MACRODANTIN) 50 MG capsule Take 1 capsule (50 mg total) by mouth at bedtime. 30 capsule 0 11/27/2015 at Unknown time  . traMADol (ULTRAM) 50 MG tablet Take 1 tablet (50 mg total) by mouth every 6 (six) hours as needed. 30 tablet 0 11/27/2015 at Unknown time  . Misc  Natural Products (GLUCOSAMINE CHONDROITIN ADV PO) Take 1 tablet by mouth 2 (two) times daily.   Unknown at Unknown time   Allergies:  Allergies  Allergen Reactions  . Keflex [Cephalexin] Hives    Tolerated Augmentin  . Morphine And Related Hives    Family History  Problem Relation Age of Onset  . Cancer Mother     ovarian  . Cancer Sister     breast   Social History:  reports that she quit smoking about 53 years ago. Her smoking use included Cigarettes. She has a 1.25 pack-year smoking history. She has never used smokeless tobacco. She reports that she drinks alcohol. She reports that she does not use illicit drugs.  ROS: A complete review of systems was performed.  All systems are negative except for pertinent findings as  noted. ROS   Physical Exam:  Vital signs in last 24 hours: Temp:  [97.5 F (36.4 C)] 97.5 F (36.4 C) (04/21 0930) Pulse Rate:  [87] 87 (04/21 0930) Resp:  [16] 16 (04/21 0930) BP: (125)/(88) 125/88 mmHg (04/21 0930) SpO2:  [100 %] 100 % (04/21 0930) Weight:  [54.885 kg (121 lb)] 54.885 kg (121 lb) (04/21 0930) General:  Alert and oriented, No acute distress HEENT: Normocephalic, atraumatic Lungs: Regular rate and effort Extremities: No edema Neurologic: Grossly intact  Laboratory Data:  No results found for this or any previous visit (from the past 24 hour(s)). No results found for this or any previous visit (from the past 240 hour(s)). Creatinine: No results for input(s): CREATININE in the last 168 hours.  Impression/Assessment:  Left renal stones  Plan:  I discussed with the patient the nature risks benefits and alternatives to left ureteroscopy with laser lithotripsy and ureteral stent placement. We discussed the stone burden and that she may need a staged procedure. All questions answered and she elects to proceed.  Tina Bell 11/28/2015, 11:23 AM

## 2015-11-28 NOTE — Transfer of Care (Signed)
Immediate Anesthesia Transfer of Care Note  Patient: Tina Bell  Procedure(s) Performed: Procedure(s) (LRB): CYSTOSCOPY LEFT /URETEROSCOPY/HOLMIUM LASER/STENT REPLACEMENT (Left) HOLMIUM LASER APPLICATION (Left) STONE EXTRACTION WITH BASKET (Left)  Patient Location: PACU  Anesthesia Type: General  Level of Consciousness: awake, sedated, patient cooperative and responds to stimulation  Airway & Oxygen Therapy: Patient Spontanous Breathing and Patient connected to face mask oxygen  Post-op Assessment: Report given to PACU RN, Post -op Vital signs reviewed and stable and Patient moving all extremities  Post vital signs: Reviewed and stable  Complications: No apparent anesthesia complications

## 2015-11-28 NOTE — Anesthesia Procedure Notes (Signed)
Procedure Name: LMA Insertion Date/Time: 11/28/2015 11:46 AM Performed by: Justice Rocher Pre-anesthesia Checklist: Patient identified, Emergency Drugs available, Suction available and Patient being monitored Patient Re-evaluated:Patient Re-evaluated prior to inductionOxygen Delivery Method: Circle System Utilized Preoxygenation: Pre-oxygenation with 100% oxygen Intubation Type: IV induction Ventilation: Mask ventilation without difficulty LMA: LMA inserted LMA Size: 4.0 Number of attempts: 1 Airway Equipment and Method: bite block Placement Confirmation: positive ETCO2 Tube secured with: Tape Dental Injury: Teeth and Oropharynx as per pre-operative assessment

## 2015-11-28 NOTE — Discharge Instructions (Signed)
Ureteral Stent Implantation, Care After Refer to this sheet in the next few weeks. These instructions provide you with information on caring for yourself after your procedure. Your health care provider may also give you more specific instructions. Your treatment has been planned according to current medical practices, but problems sometimes occur. Call your health care provider if you have any problems or questions after your procedure.  REMOVAL OF THE STENT: Remove the stent by pulling the string with slow steady pressure on Tuesday morning, December 02, 2015.   WHAT TO EXPECT AFTER THE PROCEDURE You should be back to normal activity within 48 hours after the procedure. Nausea and vomiting may occur and are commonly the result of anesthesia. It is common to experience sharp pain in the back or lower abdomen and penis with voiding. This is caused by movement of the ends of the stent with the act of urinating.It usually goes away within minutes after you have stopped urinating. HOME CARE INSTRUCTIONS Make sure to drink plenty of fluids. You may have small amounts of bleeding, causing your urine to be red. This is normal. Certain movements may trigger pain or a feeling that you need to urinate.   Be sure to keep all follow-up appointments so your health care provider can check that you are healing properly. SEEK MEDICAL CARE IF:  You experience increasing pain.  Your pain medicine is not working. SEEK IMMEDIATE MEDICAL CARE IF:  Your urine is dark red or has blood clots.  You are leaking urine (incontinent).  You have a fever, chills, feeling sick to your stomach (nausea), or vomiting.  Your pain is not relieved by pain medicine.  The end of the stent comes out of the urethra.  You are unable to urinate.   This information is not intended to replace advice given to you by your health care provider. Make sure you discuss any questions you have with your health care provider.   Document  Released: 03/28/2013 Document Revised: 07/31/2013 Document Reviewed: 02/07/2015 Elsevier Interactive Patient Education 2016 [place] Anesthesia Home Care Instructions  Activity: Get plenty of rest for the remainder of the day. A responsible adult should stay with you for 24 hours following the procedure.  For the next 24 hours, DO NOT: -Drive a car -Paediatric nurse -Drink alcoholic beverages -Take any medication unless instructed by your physician -Make any legal decisions or sign important papers.  Meals: Start with liquid foods such as gelatin or soup. Progress to regular foods as tolerated. Avoid greasy, spicy, heavy foods. If nausea and/or vomiting occur, drink only clear liquids until the nausea and/or vomiting subsides. Call your physician if vomiting continues.  Special Instructions/Symptoms: Your throat may feel dry or sore from the anesthesia or the breathing tube placed in your throat during surgery. If this causes discomfort, gargle with warm salt water. The discomfort should disappear within 24 hours.  If you had a scopolamine patch placed behind your ear for the management of post- operative nausea and/or vomiting:  1. The medication in the patch is effective for 72 hours, after which it should be removed.  Wrap patch in a tissue and discard in the trash. Wash hands thoroughly with soap and water. 2. You may remove the patch earlier than 72 hours if you experience unpleasant side effects which may include dry mouth, dizziness or visual disturbances. 3. Avoid touching the patch. Wash your hands with soap and water after contact with the patch.

## 2015-11-28 NOTE — Anesthesia Preprocedure Evaluation (Addendum)
Anesthesia Evaluation  Patient identified by MRN, date of birth, ID band Patient awake    Reviewed: Allergy & Precautions, NPO status , Patient's Chart, lab work & pertinent test results  History of Anesthesia Complications (+) PONV and history of anesthetic complications  Airway Mallampati: I  TM Distance: >3 FB Neck ROM: Full    Dental  (+) Teeth Intact   Pulmonary former smoker,    breath sounds clear to auscultation       Cardiovascular + Peripheral Vascular Disease   Rhythm:Regular Rate:Normal     Neuro/Psych  Neuromuscular disease negative psych ROS   GI/Hepatic Neg liver ROS, hiatal hernia,   Endo/Other  negative endocrine ROS  Renal/GU Renal disease  negative genitourinary   Musculoskeletal  (+) Arthritis ,   Abdominal Normal abdominal exam  (+)   Peds negative pediatric ROS (+)  Hematology negative hematology ROS (+)   Anesthesia Other Findings   Reproductive/Obstetrics negative OB ROS                            01/2015 EKG: normal sinus rhythm.   Anesthesia Physical Anesthesia Plan  ASA: III  Anesthesia Plan: General   Post-op Pain Management:    Induction: Intravenous  Airway Management Planned: LMA  Additional Equipment:   Intra-op Plan:   Post-operative Plan: Extubation in OR  Informed Consent: I have reviewed the patients History and Physical, chart, labs and discussed the procedure including the risks, benefits and alternatives for the proposed anesthesia with the patient or authorized representative who has indicated his/her understanding and acceptance.   Dental advisory given  Plan Discussed with: CRNA  Anesthesia Plan Comments:         Anesthesia Quick Evaluation

## 2015-12-01 ENCOUNTER — Encounter (HOSPITAL_BASED_OUTPATIENT_CLINIC_OR_DEPARTMENT_OTHER): Payer: Self-pay | Admitting: Urology

## 2016-01-06 ENCOUNTER — Ambulatory Visit
Admission: RE | Admit: 2016-01-06 | Discharge: 2016-01-06 | Disposition: A | Discharge status: Home / Self Care | Payer: Medicare Other | Source: Ambulatory Visit | Attending: Allergy and Immunology | Admitting: Allergy and Immunology

## 2016-01-06 ENCOUNTER — Ambulatory Visit (INDEPENDENT_AMBULATORY_CARE_PROVIDER_SITE_OTHER): Payer: Medicare Other | Admitting: Allergy and Immunology

## 2016-01-06 ENCOUNTER — Other Ambulatory Visit: Payer: Self-pay | Admitting: Allergy and Immunology

## 2016-01-06 ENCOUNTER — Encounter: Payer: Self-pay | Admitting: Allergy and Immunology

## 2016-01-06 VITALS — BP 132/84 | HR 68 | Temp 97.5°F | Resp 18 | Ht 61.93 in | Wt 125.0 lb

## 2016-01-06 DIAGNOSIS — R059 Cough, unspecified: Secondary | ICD-10-CM

## 2016-01-06 DIAGNOSIS — Z8709 Personal history of other diseases of the respiratory system: Secondary | ICD-10-CM | POA: Diagnosis not present

## 2016-01-06 DIAGNOSIS — L500 Allergic urticaria: Secondary | ICD-10-CM

## 2016-01-06 DIAGNOSIS — L509 Urticaria, unspecified: Secondary | ICD-10-CM

## 2016-01-06 DIAGNOSIS — R05 Cough: Secondary | ICD-10-CM

## 2016-01-06 LAB — CBC WITH DIFFERENTIAL/PLATELET
Basophils Absolute: 56 cells/uL (ref 0–200)
Basophils Relative: 1 %
Eosinophils Absolute: 168 cells/uL (ref 15–500)
Eosinophils Relative: 3 %
HCT: 41.5 % (ref 35.0–45.0)
Hemoglobin: 13.6 g/dL (ref 11.7–15.5)
Lymphocytes Relative: 33 %
Lymphs Abs: 1848 cells/uL (ref 850–3900)
MCH: 27.1 pg (ref 27.0–33.0)
MCHC: 32.8 g/dL (ref 32.0–36.0)
MCV: 82.8 fL (ref 80.0–100.0)
MPV: 9.7 fL (ref 7.5–12.5)
Monocytes Absolute: 280 cells/uL (ref 200–950)
Monocytes Relative: 5 %
Neutro Abs: 3248 cells/uL (ref 1500–7800)
Neutrophils Relative %: 58 %
Platelets: 295 10*3/uL (ref 140–400)
RBC: 5.01 MIL/uL (ref 3.80–5.10)
RDW: 14.4 % (ref 11.0–15.0)
WBC: 5.6 10*3/uL (ref 3.8–10.8)

## 2016-01-06 LAB — TSH: TSH: 2.48 mIU/L

## 2016-01-06 LAB — T4, FREE: Free T4: 1.1 ng/dL (ref 0.8–1.8)

## 2016-01-06 MED ORDER — MONTELUKAST SODIUM 10 MG PO TABS: ORAL_TABLET | ORAL | Status: DC

## 2016-01-06 MED ORDER — RANITIDINE HCL 150 MG PO CAPS: 150.0000 mg | ORAL_CAPSULE | Freq: Two times a day (BID) | ORAL | Status: DC

## 2016-01-06 NOTE — Progress Notes (Signed)
Dear Dr. Shelia Media,  Thank you for referring Oswaldo Done to the [facility] on 01/06/2016.   Below is a summation of this patient's evaluation and recommendations.  Thank you for your referral. I will keep you informed about this patient's response to treatment.   If you have any questions please to do hestitate to contact me.   Sincerely,  Jiles Prows, MD [facility]   ______________________________________________________________________    NEW PATIENT NOTE  Referring Provider: Deland Pretty, MD Primary Provider: Horatio Pel, MD Date of office visit: 01/06/2016    Subjective:   Chief Complaint:  Tina Bell (DOB: 04-06-1936) is a 80 y.o. female with a chief complaint of Urticaria  who presents to the clinic on 01/06/2016 with the following problems:  HPI: Vernecia returns to this clinic in evaluation of urticaria. Apparently she's been having urticaria for the past 2 weeks manifested as red spots and dermatographia that is intensely itchy and lasts less than one hour and never heals with scar hyperpigmentation occurring on a daily basis without any associated systemic or constitutional symptoms or obvious trigger. However, it should be noted that her urticaria started around the same point in time in which she developed a problem with kidney stones. Apparently she was placed on nitrofurantoin and within several days developed urticaria but she continued to use her nitrofurantoin until she had a unsuccessful basket removal of her kidney stone performed. She was then switched to sulfamethoxazole on April 21 as pretreatment for another successful basket removal of her kidney stone. She iha not been on any antibiotics over the course of the past several weeks. She's been treated with prednisone which did not help her. She's been treated with Zyrtec which did not help her. She does  have seasonal allergic rhinitis that is relatively mild and that she treats with Zyrtec. Otherwise she has no other atopic history.  Past Medical History  Diagnosis Date  . History of kidney stones [id number]  . Arthritis     knees  . Bronchiectasis ([place])     pulmologist-  dr Lake Bells--   (  per note secondary to severe pertussis as child)   stable per LOV note 10-03-2015  . Thoracic ascending aortic aneurysm (HCC)     4 cm   per last ct 08-06-2015  (consult by dr Lucianne Lei tright 08-13-2015)  . H/O pleural empyema     w/ CAP  June 2016  s/p   VAT's with thoracotomy drainage empyema and decortication right lower lobe  . History of recurrent UTIs   . Renal calculus, left   . History of kidney stones   . History of pertussis as a child     severe   . Hiatal hernia   . PONV (postoperative nausea and vomiting)     and difficulty waking up  . Urgency of urination   . Wears glasses   . Borderline glaucoma     right eye    Past Surgical History  Procedure Laterality Date  . Knee arthroscopy Right 1999  . Video assisted thoracoscopy (vats)/empyema Right 01/06/2015    Procedure: VIDEO ASSISTED THORACOSCOPY (VATS)/EMPYEMA AND DRAINAGE OF EMPYEMA;  Surgeon: Ivin Poot, MD;  Location: [facility];  Service: Thoracic;  Laterality: Right;  . Total knee arthroplasty Left 05/12/2015    Procedure: LEFT TOTAL KNEE ARTHROPLASTY;  Surgeon: Gaynelle Arabian, MD;  Location: [facility];  Service:  Orthopedics;  Laterality: Left;  . Cystoscopy with ureteroscopy and stent placement Left 11/07/2015    Procedure: CYSTOSCOPY LEFT URETEROSCOPY WITH LEFT RETROGRADE PYELOGRAM;  Surgeon: Festus Aloe, MD;  Location: [facility];  Service: Urology;  Laterality: Left;  . Holmium laser application Left [id number]    Procedure: LEFT STENT PLACEMENT;  Surgeon: Festus Aloe, MD;  Location: [facility];  Service: Urology;  Laterality: Left;  . Pubovaginal sling  1998    bone anchor  . Extracorporeal shock wave lithotripsy  07-09-2013  .  Dilation and curettage of uterus  x3  1980's  . Nasal septum surgery  1989  . Tubal ligation  1974  . Cataract extraction w/ intraocular lens implant Left March 2017  . Cystoscopy/ureteroscopy/holmium laser/stent placement Left 11/28/2015    Procedure: CYSTOSCOPY LEFT /URETEROSCOPY/HOLMIUM LASER/STENT REPLACEMENT;  Surgeon: Festus Aloe, MD;  Location: [facility];  Service: Urology;  Laterality: Left;  . Holmium laser application Left [id number]    Procedure: HOLMIUM LASER APPLICATION;  Surgeon: Festus Aloe, MD;  Location: [facility];  Service: Urology;  Laterality: Left;  . Stone extraction with basket Left 11/28/2015    Procedure: STONE EXTRACTION WITH BASKET;  Surgeon: Festus Aloe, MD;  Location: [facility];  Service: Urology;  Laterality: Left;      Medication List           brimonidine 0.1 % Soln  Commonly known as:  ALPHAGAN P  Place 1 drop into the right eye 3 (three) times daily.     CALCIUM 600 + D PO  Take 1 tablet by mouth 2 (two) times daily.     GLUCOSAMINE CHONDROITIN ADV PO  Take 1 tablet by mouth 2 (two) times daily.     KRILL OIL PO  Take 1 tablet by mouth daily.        Allergies  Allergen Reactions  . Keflex [Cephalexin] Hives    Tolerated Augmentin  . Morphine And Related Hives    Review of systems negative except as noted in HPI / PMHx or noted below:  Review of Systems  Constitutional: Negative.   HENT: Negative.   Eyes: Negative.   Respiratory: Negative.   Cardiovascular: Negative.   Gastrointestinal: Negative.   Genitourinary: Negative.   Musculoskeletal: Negative.   Skin: Negative.   Neurological: Negative.   Endo/Heme/Allergies: Negative.   Psychiatric/Behavioral: Negative.     Family History  Problem Relation Age of Onset  . Cancer Mother     ovarian  . Cancer Sister     breast    Social History   Social History  . Marital Status: Married    Spouse Name: N/A  .  Number of Children: N/A  . Years of Education: N/A   Occupational History  . Not on file.   Social History Main Topics  . Smoking status: Former Smoker -- 0.25 packs/day for 5 years    Types: Cigarettes    Quit date: 05/03/1962  . Smokeless tobacco: Never Used     Comment: 1 PPWEEK  . Alcohol Use: Yes     Comment: occasional   . Drug Use: No  . Sexual Activity: Not on file   Other Topics Concern  . Not on file   Social History Narrative    Environmental and Social history  Lives at [facility], no animals located inside the household, carpeting in the bedroom, no plastic on the better pillow, and no smoking ongoing with inside the household.   Objective:  Filed Vitals:   01/06/16 1347  BP: 132/84  Pulse: 68  Temp: 97.5 F (36.4 C)  Resp: 18   Height: 5' 1.93" (157.3 cm) Weight: 125 lb (56.7 kg)  Physical Exam  Constitutional: She is well-developed, well-nourished, and in no distress.  HENT:  Head: Normocephalic.  Right Ear: Tympanic membrane, external ear and ear canal normal.  Left Ear: Tympanic membrane, external ear and ear canal normal.  Nose: Nose normal. No mucosal edema or rhinorrhea.  Mouth/Throat: Uvula is midline, oropharynx is clear and moist and mucous membranes are normal. No oropharyngeal exudate.  Eyes: Conjunctivae are normal.  Neck: Trachea normal. No tracheal tenderness present. No tracheal deviation present. No thyromegaly present.  Cardiovascular: Normal rate, regular rhythm, S1 normal, S2 normal and normal heart sounds.   No murmur heard. Pulmonary/Chest: Breath sounds normal. No stridor. No respiratory distress. She has no wheezes. She has no rales.  Musculoskeletal: She exhibits no edema.  Lymphadenopathy:       Head (right side): No tonsillar adenopathy present.       Head (left side): No tonsillar adenopathy present.    She has no cervical adenopathy.  Neurological: She is alert. Gait normal.  Skin: Rash (Several  scattered slightly erythematous urticarial lesions involving her arms) noted. She is not diaphoretic. No erythema. Nails show no clubbing.  Psychiatric: Mood and affect normal.     Diagnostics: Allergy skin tests were performed. She did not demonstrate any high degree of hypersensitivity against a screening panel of aeroallergens or foods. She had slight dermatographia.   Assessment and Plan:    1. Urticaria   2. History of bronchiectasis   3. History of empyema of pleura     1. Every day utilize the following medications:   A. cetirizine 10 mg twice a day  B. ranitidine 150 mg twice a day  C. montelukast 10 mg one tablet once a day  2. Stop all supplements including Krill oil  3. Can add Benadryl if needed  4. Obtain a chest x-ray  5. Obtain blood tests - CBC with diff., CMP, TSH, T4, TP, sed  6. Return to clinic in 2 weeks or earlier if problem  The source of Ajanae's immunological hyperreactivity is not entirely clear. I will check a few blood tests to make sure in investigation of a possible systemic disease contributing to her immunological activity and she will be using a collection of medical therapy in the hope of making her urticarial reaction less intense and more tolerable. I'm also given a have her stop all supplements including her Krill oil. Given the fact that she has had a complicated empyema I am going to obtain a chest x-ray to see if there is redevelopment of this issue. I'll regroup with her in approximately 2 weeks or earlier if there is a problem.  Jiles Prows, MD [facility]

## 2016-01-06 NOTE — Patient Instructions (Addendum)
  1. Every day utilize the following medications:   A. cetirizine 10 mg twice a day  B. ranitidine 150 mg twice a day  C. montelukast 10 mg one tablet once a day  2. Stop all supplements including Krill oil  3. Can add Benadryl if needed  4. Obtain a chest x-ray  5. Obtain blood tests - CBC with diff., CMP, TSH, T4, TP, sed  6. Return to clinic in 2 weeks or earlier if problem

## 2016-01-07 LAB — COMPREHENSIVE METABOLIC PANEL WITH GFR
ALT: 12 U/L (ref 6–29)
AST: 20 U/L (ref 10–35)
Albumin: 4.2 g/dL (ref 3.6–5.1)
Alkaline Phosphatase: 100 U/L (ref 33–130)
BUN: 17 mg/dL (ref 7–25)
CO2: 27 mmol/L (ref 20–31)
Calcium: 9.7 mg/dL (ref 8.6–10.4)
Chloride: 104 mmol/L (ref 98–110)
Creat: 0.81 mg/dL (ref 0.60–0.93)
Glucose, Bld: 91 mg/dL (ref 65–99)
Potassium: 4.3 mmol/L (ref 3.5–5.3)
Sodium: 141 mmol/L (ref 135–146)
Total Bilirubin: 0.6 mg/dL (ref 0.2–1.2)
Total Protein: 6.8 g/dL (ref 6.1–8.1)

## 2016-01-07 LAB — SEDIMENTATION RATE: Sed Rate: 11 mm/h (ref 0–30)

## 2016-01-07 LAB — THYROID PEROXIDASE ANTIBODY: Thyroperoxidase Ab SerPl-aCnc: 3 IU/mL (ref <9)

## 2016-01-20 ENCOUNTER — Encounter: Payer: Self-pay | Admitting: Allergy and Immunology

## 2016-01-20 ENCOUNTER — Ambulatory Visit (INDEPENDENT_AMBULATORY_CARE_PROVIDER_SITE_OTHER): Payer: Medicare Other | Admitting: Allergy and Immunology

## 2016-01-20 VITALS — BP 138/88 | HR 80 | Resp 16

## 2016-01-20 DIAGNOSIS — Z8709 Personal history of other diseases of the respiratory system: Secondary | ICD-10-CM | POA: Diagnosis not present

## 2016-01-20 DIAGNOSIS — L509 Urticaria, unspecified: Secondary | ICD-10-CM

## 2016-01-20 NOTE — Progress Notes (Signed)
Follow-up Note  Referring Provider: Deland Pretty, MD Primary Provider: Horatio Pel, MD Date of Office Visit: 01/20/2016  Subjective:   Tina Bell (DOB: 12-06-1935) is a 80 y.o. female who returns to the [facility] on 01/20/2016 in re-evaluation of the following:  HPI: Kinjal returns to this clinic in evaluation of her urticaria. While utilizing medical therapy established 01/07/2016 she has had complete resolution of her urticaria and has no itchiness.    Medication List           brimonidine 0.1 % Soln  Commonly known as:  ALPHAGAN P  Place 1 drop into the right eye 3 (three) times daily.     CALCIUM 600 + D PO  Take 1 tablet by mouth 2 (two) times daily. Reported on 01/20/2016     cetirizine 10 MG tablet  Commonly known as:  ZYRTEC  Take 10 mg by mouth 2 (two) times daily.     GLUCOSAMINE CHONDROITIN ADV PO  Take 1 tablet by mouth 2 (two) times daily. Reported on 01/20/2016     montelukast 10 MG tablet  Commonly known as:  SINGULAIR  Take one tablet once daily.     ranitidine 150 MG capsule  Commonly known as:  ZANTAC  Take 1 capsule (150 mg total) by mouth 2 (two) times daily.        Past Medical History  Diagnosis Date  . History of kidney stones [id number]  . Arthritis     knees  . Bronchiectasis ([place])     pulmologist-  dr Lake Bells--   (  per note secondary to severe pertussis as child)   stable per LOV note 10-03-2015  . Thoracic ascending aortic aneurysm (HCC)     4 cm   per last ct 08-06-2015  (consult by dr Lucianne Lei tright 08-13-2015)  . H/O pleural empyema     w/ CAP  June 2016  s/p   VAT's with thoracotomy drainage empyema and decortication right lower lobe  . History of recurrent UTIs   . Renal calculus, left   . History of kidney stones   . History of pertussis as a child     severe   . Hiatal hernia   . PONV (postoperative nausea and vomiting)     and difficulty waking up  . Urgency of urination   . Wears glasses    . Borderline glaucoma     right eye    Past Surgical History  Procedure Laterality Date  . Knee arthroscopy Right 1999  . Video assisted thoracoscopy (vats)/empyema Right 01/06/2015    Procedure: VIDEO ASSISTED THORACOSCOPY (VATS)/EMPYEMA AND DRAINAGE OF EMPYEMA;  Surgeon: Ivin Poot, MD;  Location: [facility];  Service: Thoracic;  Laterality: Right;  . Total knee arthroplasty Left 05/12/2015    Procedure: LEFT TOTAL KNEE ARTHROPLASTY;  Surgeon: Gaynelle Arabian, MD;  Location: [facility];  Service: Orthopedics;  Laterality: Left;  . Cystoscopy with ureteroscopy and stent placement Left 11/07/2015    Procedure: CYSTOSCOPY LEFT URETEROSCOPY WITH LEFT RETROGRADE PYELOGRAM;  Surgeon: Festus Aloe, MD;  Location[facility];  Service: Urology;  Laterality: Left;  . Holmium laser application Left [id number]    Procedure: LEFT STENT PLACEMENT;  Surgeon: Festus Aloe, MD;  Location: [facility];  Service: Urology;  Laterality: Left;  . Pubovaginal sling  1998    bone anchor  . Extracorporeal shock wave lithotripsy  07-09-2013  . Dilation and curettage of uterus  x3  1980's  .  Nasal septum surgery  1989  . Tubal ligation  1974  . Cataract extraction w/ intraocular lens implant Left March 2017  . Cystoscopy/ureteroscopy/holmium laser/stent placement Left 11/28/2015    Procedure: CYSTOSCOPY LEFT /URETEROSCOPY/HOLMIUM LASER/STENT REPLACEMENT;  Surgeon: Festus Aloe, MD;  Location: [facility];  Service: Urology;  Laterality: Left;  . Holmium laser application Left [id number]    Procedure: HOLMIUM LASER APPLICATION;  Surgeon: Festus Aloe, MD;  Location: [facility];  Service: Urology;  Laterality: Left;  . Stone extraction with basket Left 11/28/2015    Procedure: STONE EXTRACTION WITH BASKET;  Surgeon: Festus Aloe, MD;  Location: [facility];  Service: Urology;  Laterality: Left;    Allergies  Allergen Reactions  . Keflex [Cephalexin] Hives     Tolerated Augmentin  . Morphine And Related Hives    Review of systems negative except as noted in HPI / PMHx or noted below:  Review of Systems  Constitutional: Negative.   HENT: Negative.   Eyes: Negative.   Respiratory: Negative.   Cardiovascular: Negative.   Gastrointestinal: Negative.   Genitourinary: Negative.   Musculoskeletal: Negative.   Skin: Negative.   Neurological: Negative.   Endo/Heme/Allergies: Negative.   Psychiatric/Behavioral: Negative.      Objective:   Filed Vitals:   01/20/16 1629  BP: 138/88  Pulse: 80  Resp: 16          Physical Exam  Skin: No rash (No urticaria) noted.    Diagnostics: Results of blood tests obtained on 01/06/2016 identified normal hepatic and renal function, a white blood cell count of 5.6 with a normal differential, hemoglobin of 13.6 with an MCV of 82.8 and a platelet count of 295. She had a normal TSH and T4 and thyroid peroxidase antibody.  Results of a chest x-ray obtained on 11/06/2015 identified a questionable nodule at the right lung base. She had chronic changes of the right lung base that were unchanged from her chest x-ray 04/02/2015 and a CT scan of 08/06/2015. In comparison to her prior CT scan there appeared to be some areas of overlapping scarring which may have accounted for her nodule.      Assessment and Plan:   1. Urticaria   2. History of empyema of pleura     1. Every day utilize the following medications until August 1st:   A. cetirizine 10 mg twice a day  B. ranitidine 150 mg twice a day  C. montelukast 10 mg one tablet once a day  2. Starting August 1st use the following medications:   A. cetirizine 10 mg once a day  B. ranitidine 150 mg once a day  C. montelukast 10 mg once a day  3. On September 1 stop all 3 medications  4. Can add Benadryl if needed  5. Return to clinic if problem  Deoni's had an excellent response to medical treatment and we'll see if we can taper her off  medications as noted above. In regard to her chest x-ray abnormality, which is a very subtle call, she will follow-up with her thoracic surgeon. Apparently she has an appointment with him over the course of the next several months. I will be happy to see her back in this clinic should she develop significant problems with the plan mentioned above.  Allena Katz, MD [place]

## 2016-01-20 NOTE — Patient Instructions (Signed)
  1. Every day utilize the following medications until August 1st:   A. cetirizine 10 mg twice a day  B. ranitidine 150 mg twice a day  C. montelukast 10 mg one tablet once a day  2. Starting August 1st use the following medications:   A. cetirizine 10 mg once a day  B. ranitidine 150 mg once a day  C. montelukast 10 mg once a day  3. On September 1 stop all 3 medications  4. Can add Benadryl if needed  5. Return to clinic if problem

## 2016-02-26 ENCOUNTER — Other Ambulatory Visit: Payer: Self-pay | Admitting: Internal Medicine

## 2016-02-26 DIAGNOSIS — Z1231 Encounter for screening mammogram for malignant neoplasm of breast: Secondary | ICD-10-CM

## 2016-03-29 ENCOUNTER — Ambulatory Visit
Admission: RE | Admit: 2016-03-29 | Discharge: 2016-03-29 | Disposition: A | Discharge status: Home / Self Care | Payer: Medicare Other | Source: Ambulatory Visit | Attending: Internal Medicine | Admitting: Internal Medicine

## 2016-03-29 DIAGNOSIS — Z1231 Encounter for screening mammogram for malignant neoplasm of breast: Secondary | ICD-10-CM

## 2016-04-01 ENCOUNTER — Ambulatory Visit (INDEPENDENT_AMBULATORY_CARE_PROVIDER_SITE_OTHER): Payer: Medicare Other | Admitting: Pulmonary Disease

## 2016-04-01 ENCOUNTER — Encounter: Payer: Self-pay | Admitting: Pulmonary Disease

## 2016-04-01 DIAGNOSIS — J470 Bronchiectasis with acute lower respiratory infection: Secondary | ICD-10-CM | POA: Diagnosis not present

## 2016-04-01 NOTE — Progress Notes (Signed)
Attending:  I have seen and examined the patient with Tina Bell gross and acute findings from her note  Tina Bell has been doing quite well recently, no recent flares of bronchiectasis She had a lot of trouble with rash when she was taking nitrofurantoin  On exam: Lungs clear to auscultation normal effort No rash today Total knee replacement scar well-healed  Bronchiectasis: Stable interval, continue current management, she was prescribed azithromycin by her primary care physician to use on an as-needed basis when she travels, I agree with this Follow-up 6 months  In regards to the nitrofurantoin, I have asked her to not take this again given its significant pulmonary toxicity risk  Roselie Awkward, MD [facility] Pager: [phone] Cell: [phone] After 3pm or if no response, call [phone]

## 2016-04-01 NOTE — Patient Instructions (Addendum)
It is great to meet you today. Continue expectant monitoring, infection prevention measures, daily exercise for mucociliary clearance. Remember to get your flu shot this fall. Follow up with Dr. Lake Bells in 6 months   We will add Macrodantin to your list of medications you do not tolerate. Please contact office for sooner follow up if symptoms do not improve or worsen or seek emergency care

## 2016-04-01 NOTE — Assessment & Plan Note (Addendum)
Stable 3 month interval Plan: Continue expectant monitoring, infection prevention measures, daily exercise for mucociliary clearance. Remember to get your flu shot this fall. Follow up with Dr. Lake Bells in 6 months   We will add Macrodantin to your list of medications you do not tolerate. Please contact office for sooner follow up if symptoms do not improve or worsen or seek emergency care

## 2016-04-01 NOTE — Progress Notes (Signed)
History of Present Illness Tina Bell is a 80 y.o. female with Bronchiectasis , CAP in 01/2016 with empyema requiring VATS. She is followed by Dr. Lake Bells   04/01/2016 Follow Up appointment: Pt. Presents today for follow up. She has had a very stable interval in regards to her bronchiectasis. She has had 2 recent kidney surgeries for kidney stones, and left eye cataract surgery. Recent rash with Macrodantin.She has not had any flares, she has minimal secretions. She is exercising 5 times a week , and plays 18 holes of golf every Wednesday.She denies fever,chest pain, frequent cough, purulent secretions, orthopnea or hemoptysis.Weight has been stable. She was reminded to get her flu shot this fall.  Tests FEV1 93% predicted, no change with bronchodilator, total lung capacity normal, DLCO slightly low at 73% predicted Last sputum cultures 12/2014  Past medical hx Past Medical History:  Diagnosis Date  . Arthritis    knees  . Borderline glaucoma    right eye  . Bronchiectasis ([place])    pulmologist-  dr Lake Bells--   (  per note secondary to severe pertussis as child)   stable per LOV note 10-03-2015  . H/O pleural empyema    w/ CAP  June 2016  s/p   VAT's with thoracotomy drainage empyema and decortication right lower lobe  . Hiatal hernia   . History of kidney stones [id number]  . History of kidney stones   . History of pertussis as a child    severe   . History of recurrent UTIs   . PONV (postoperative nausea and vomiting)    and difficulty waking up  . Renal calculus, left   . Thoracic ascending aortic aneurysm (HCC)    4 cm   per last ct 08-06-2015  (consult by dr Lucianne Lei tright 08-13-2015)  . Urgency of urination   . Wears glasses      Past surgical hx, Family hx, Social hx all reviewed.  Current Outpatient Prescriptions on File Prior to Visit  Medication Sig  . brimonidine (ALPHAGAN P) 0.1 % SOLN Place 1 drop into the right eye 3 (three) times daily.  . Calcium  Carb-Cholecalciferol (CALCIUM 600 + D PO) Take 1 tablet by mouth 2 (two) times daily. Reported on 01/20/2016  . cetirizine (ZYRTEC) 10 MG tablet Take 10 mg by mouth 2 (two) times daily.  . Misc Natural Products (GLUCOSAMINE CHONDROITIN ADV PO) Take 1 tablet by mouth 2 (two) times daily. Reported on 01/20/2016  . montelukast (SINGULAIR) 10 MG tablet Take one tablet once daily.  . ranitidine (ZANTAC) 150 MG capsule Take 1 capsule (150 mg total) by mouth 2 (two) times daily.   Current Facility-Administered Medications on File Prior to Visit  Medication  . levofloxacin (LEVAQUIN) IVPB 500 mg     Allergies  Allergen Reactions  . Keflex [Cephalexin] Hives    Tolerated Augmentin  . Morphine And Related Hives  . Macrodantin [Nitrofurantoin Macrocrystal] Rash    Do not take    Review Of Systems:  Constitutional:   No  weight loss, night sweats,  Fevers, chills, fatigue, or  lassitude.  HEENT:   No headaches,  Difficulty swallowing,  Tooth/dental problems, or  Sore throat,                No sneezing, itching, ear ache, nasal congestion, post nasal drip,   CV:  No chest pain,  Orthopnea, PND, swelling in lower extremities, anasarca, dizziness, palpitations, syncope.   GI  No heartburn, indigestion, abdominal  pain, nausea, vomiting, diarrhea, change in bowel habits, loss of appetite, bloody stools.   Resp: No shortness of breath with exertion or at rest.  No excess mucus, no productive cough,  No non-productive cough,  No coughing up of blood.  No change in color of mucus.  No wheezing.  No chest wall deformity  Skin: no rash or lesions.  GU: no dysuria, change in color of urine, no urgency or frequency.  No flank pain, no hematuria   MS:  No joint pain or swelling.  No decreased range of motion.  No back pain.  Psych:  No change in mood or affect. No depression or anxiety.  No memory loss.   Vital Signs BP 126/66 (BP Location: Right Arm, Cuff Size: Normal)   Pulse 85   Ht 5\' 1"  (1.549  m)   Wt 127 lb 3.2 oz (57.7 kg)   SpO2 100%   BMI 24.03 kg/m    Physical Exam:  General- No distress,  A&Ox3, pleasant ENT: No sinus tenderness, TM clear, pale nasal mucosa, no oral exudate,no post nasal drip, no LAN Cardiac: S1, S2, regular rate and rhythm, no murmur Chest: No wheeze/ rales/ dullness, rhonchi per bases,; no accessory muscle use, no nasal flaring, no sternal retractions Abd.: Soft Non-tender Ext: No clubbing cyanosis, edema Neuro:  normal strength Skin: No rashes, warm and dry Psych: normal mood and behavior   Assessment/Plan  Bronchiectasis without acute exacerbation (HCC) Stable 3 month interval Plan: Continue expectant monitoring, infection prevention measures, daily exercise for mucociliary clearance. Remember to get your flu shot this fall. Follow up with Dr. Lake Bells in 6 months   We will add Macrodantin to your list of medications you do not tolerate. Please contact office for sooner follow up if symptoms do not improve or worsen or seek emergency care       Magdalen Spatz, NP 04/01/2016  2:06 PM

## 2016-04-27 ENCOUNTER — Ambulatory Visit (INDEPENDENT_AMBULATORY_CARE_PROVIDER_SITE_OTHER)
Admission: RE | Admit: 2016-04-27 | Discharge: 2016-04-27 | Disposition: A | Discharge status: Home / Self Care | Payer: Medicare Other | Source: Ambulatory Visit | Attending: Adult Health | Admitting: Adult Health

## 2016-04-27 ENCOUNTER — Ambulatory Visit (INDEPENDENT_AMBULATORY_CARE_PROVIDER_SITE_OTHER): Payer: Medicare Other | Admitting: Adult Health

## 2016-04-27 ENCOUNTER — Encounter: Payer: Self-pay | Admitting: Adult Health

## 2016-04-27 VITALS — BP 104/70 | HR 97 | Temp 97.9°F | Ht 63.0 in | Wt 122.0 lb

## 2016-04-27 DIAGNOSIS — R05 Cough: Secondary | ICD-10-CM

## 2016-04-27 DIAGNOSIS — J471 Bronchiectasis with (acute) exacerbation: Secondary | ICD-10-CM | POA: Diagnosis not present

## 2016-04-27 DIAGNOSIS — R059 Cough, unspecified: Secondary | ICD-10-CM

## 2016-04-27 MED ORDER — AMOXICILLIN-POT CLAVULANATE 875-125 MG PO TABS: 1.0000 | ORAL_TABLET | Freq: Two times a day (BID) | ORAL | 0 refills | Status: AC

## 2016-04-27 NOTE — Progress Notes (Signed)
Subjective:    Patient ID: Tina Bell, female    DOB: August 07, 1936, 80 y.o.   MRN: [id number]  HPI Synopsis: First referred to [facility] pulmonary in 2015. Found to have bronchiectasis on high-resolution CT in 2015 this is believed to be due to a severe pertussis infection as a child. Never smoked 12/2013 CT chest high res (entrikin read)> scattered throughout the lungs, particularly in the lingula and RML, there is cylindrical bronchiectasis and bronchiolectasis with nodularity suggestive of MAI, extensive air trapping and atherosclerosis, small hiatal hernia June 2015 pulmonary function test> normal ratio, FEV1 93% predicted, no change with bronchodilator, total lung capacity normal, DLCO slightly low at 73% predicted Previous Right VATS for RLL empyema   04/27/2016 Acute OV   Patient presents for an acute office visit wit her husband.  Complains of 3 weeks of cough , congestion , thick yellow and green mucus.  Had some low grade fevers and chills.  Just back from cruise in [place].  Husband had similar symptoms . Says many passengers had URI sx with cough .  Took ZPack , which did not help much  but still coughing up thick green mucus.  Took some cough syrup that caused confusion.  Confusion resolved after stopping cough syrup.  Denies chest pain, orthopnea, edema or hemoptysis .  Appetite is down some, no nv/d/.    Review of Systems Constitutional:   No  weight loss, night sweats,  Fevers, chills, +fatigue, or  lassitude.  HEENT:   No headaches,  Difficulty swallowing,  Tooth/dental problems, or  Sore throat,                No sneezing, itching, ear ache, nasal congestion, post nasal drip,   CV:  No chest pain,  Orthopnea, PND, swelling in lower extremities, anasarca, dizziness, palpitations, syncope.   GI  No   abdominal pain, nausea, vomiting, diarrhea, change in bowel habits, loss of appetite, bloody stools.   Resp:   No chest wall deformity  Skin: no rash or  lesions.  GU: no dysuria, change in color of urine, no urgency or frequency.  No flank pain, no hematuria   MS:  No joint pain or swelling.  No decreased range of motion.  No back pain.  Psych:  No change in mood or affect. No depression or anxiety.  No memory loss.         Objective:   Physical Exam   Vitals:   04/27/16 1059  BP: 104/70  Pulse: 97  Temp: 97.9 F (36.6 C)  TempSrc: Oral  SpO2: 94%  Weight: 122 lb (55.3 kg)  Height: 5\' 3"  (1.6 m)    GEN: A/Ox3;  frail and thin, in wheelchair    HEENT:  [place]/AT,  EACs-clear, TMs-wnl, NOSE-clear, THROAT-clear, no lesions, no postnasal drip or exudate noted.   NECK:  Supple w/ fair ROM; no JVD; normal carotid impulses w/o bruits; no thyromegaly or nodules palpated; no lymphadenopathy.    RESP   Few trace rhonchi ,    No accessory muscle use, no dullness to percussion  CARD:  RRR, no m/r/g  , no peripheral edema, pulses intact, no cyanosis or clubbing.  GI:   Soft & nt; nml bowel sounds; no organomegaly or masses detected.   Musco: Warm bil, no deformities or joint swelling noted.   Neuro: alert, no focal deficits noted.    Skin: Warm, no lesions or rashes  CXR  04/27/16 COPD changes with scarring along right base  Berlyn Saylor NP-C  [facility]  04/27/2016

## 2016-04-27 NOTE — Assessment & Plan Note (Signed)
Flare  CXR w/ no sign of PNA   Plan  Patient Instructions  Begin Augmetnin 875mg  Twice daily  For 7 days  Mucinex DM Twice daily  As needed  Cough/congestion  Eat yogurt and take probiotic while on antibiotic.  Follow up with Dr. Lake Bells 4-6 weeks and As needed   Please contact office for sooner follow up if symptoms do not improve or worsen or seek emergency care  Avoid codeine meds.

## 2016-04-27 NOTE — Patient Instructions (Addendum)
Begin Augmetnin 875mg  Twice daily  For 7 days  Mucinex DM Twice daily  As needed  Cough/congestion  Eat yogurt and take probiotic while on antibiotic.  Follow up with Dr. Lake Bells 4-6 weeks and As needed   Please contact office for sooner follow up if symptoms do not improve or worsen or seek emergency care  Avoid codeine meds.

## 2016-05-01 NOTE — Progress Notes (Signed)
Reviewed, agree 

## 2016-05-03 ENCOUNTER — Ambulatory Visit (INDEPENDENT_AMBULATORY_CARE_PROVIDER_SITE_OTHER): Payer: Medicare Other | Admitting: Acute Care

## 2016-05-03 ENCOUNTER — Encounter: Payer: Self-pay | Admitting: Acute Care

## 2016-05-03 VITALS — BP 138/82 | HR 62 | Temp 97.5°F | Ht 63.0 in | Wt 121.0 lb

## 2016-05-03 DIAGNOSIS — J209 Acute bronchitis, unspecified: Secondary | ICD-10-CM

## 2016-05-03 DIAGNOSIS — R197 Diarrhea, unspecified: Secondary | ICD-10-CM

## 2016-05-03 MED ORDER — AZITHROMYCIN 250 MG PO TABS: ORAL_TABLET | ORAL | 0 refills | Status: DC

## 2016-05-03 NOTE — Progress Notes (Signed)
History of Present Illness Tina Bell is a 80 y.o. female with bronchiectasis followed by Dr. Lake Bells  HPI Synopsis: First referred to [facility] pulmonary in 2015. Found to have bronchiectasis on high-resolution CT in 2015 this is believed to be due to a severe pertussis infection as a child. Never smoked 12/2013 CT chest high res (entrikin read)> scattered throughout the lungs, particularly in the lingula and RML, there is cylindrical bronchiectasis and bronchiolectasis with nodularity suggestive of MAI, extensive air trapping and atherosclerosis, small hiatal hernia June 2015 pulmonary function test> normal ratio, FEV1 93% predicted, no change with bronchodilator, total lung capacity normal, DLCO slightly low at 73% predicted Previous Right VATS for RLL empyema   05/03/2016 Acute OV: Pt. Returns today after being seen last week in the office for bronchitis. CXR at that visit was clear. She was started on Augmentin on 9/19. She has almost completed the medication , and is still having low grade fever, secretions are white, but she is having diarrhea and is weak due to what she feels is dehydration. She continues to have cough. She is using robitussin which is helping. She is taking probiotic and eating Yogurt with her antibiotic treatment .We did discuss that we will change antibiotic, and continue hydrating,  and if the  diarrhea does not clear we will need to do a stool analysis.She has been out of the country traveling to  [place], [place], [place], and [place] on a cruise where there were many other travelers who were sick  with upper respiratory illnesses. She did take a z-pack while on her trip for bronchitis on 04/13/2016.Her husband had a record of her temperatures, which have been in the 98 degree  range since 9/21. She is improving but not quite better with diarrhea due to antibiotic treatment.  Tests  CXR 04/27/2016:  IMPRESSION: COPD. Chronic changes/ scarring at the right lung  base. No active disease.  Past medical hx Past Medical History:  Diagnosis Date  . Arthritis    knees  . Borderline glaucoma    right eye  . Bronchiectasis ([place])    pulmologist-  dr Lake Bells--   (  per note secondary to severe pertussis as child)   stable per LOV note 10-03-2015  . H/O pleural empyema    w/ CAP  June 2016  s/p   VAT's with thoracotomy drainage empyema and decortication right lower lobe  . Hiatal hernia   . History of kidney stones [id number]  . History of kidney stones   . History of pertussis as a child    severe   . History of recurrent UTIs   . PONV (postoperative nausea and vomiting)    and difficulty waking up  . Renal calculus, left   . Thoracic ascending aortic aneurysm (HCC)    4 cm   per last ct 08-06-2015  (consult by dr Lucianne Lei tright 08-13-2015)  . Urgency of urination   . Wears glasses      Past surgical hx, Family hx, Social hx all reviewed.  Current Outpatient Prescriptions on File Prior to Visit  Medication Sig  . amoxicillin-clavulanate (AUGMENTIN) 875-125 MG tablet Take 1 tablet by mouth 2 (two) times daily.  . brimonidine (ALPHAGAN P) 0.1 % SOLN Place 1 drop into the right eye 3 (three) times daily.  . Calcium Carb-Cholecalciferol (CALCIUM 600 + D PO) Take 1 tablet by mouth 2 (two) times daily. Reported on 01/20/2016  . timolol (TIMOPTIC) 0.5 % ophthalmic solution 1 drop in the  right eye  . Calcium Carbonate-Vit D-Min (CALCIUM 1200 PO) Take 1 capsule by mouth daily.   Current Facility-Administered Medications on File Prior to Visit  Medication  . levofloxacin (LEVAQUIN) IVPB 500 mg     Allergies  Allergen Reactions  . Keflex [Cephalexin] Hives    Tolerated Augmentin  . Morphine And Related Hives  . Macrodantin [Nitrofurantoin Macrocrystal] Rash    Do not take    Review Of Systems:  Constitutional:   No  weight loss, +night sweats, + Fevers, chills, +fatigue, or  lassitude.  HEENT:   + headaches,  No Difficulty swallowing,   Tooth/dental problems, or  Sore throat,                No sneezing, itching, ear ache, nasal congestion, post nasal drip,   CV:  No chest pain,  Orthopnea, PND, swelling in lower extremities, anasarca, dizziness, palpitations, syncope.   GI  No heartburn, indigestion, abdominal pain, + nausea, vomiting, ++ diarrhea, change in bowel habits, + loss of appetite,no  bloody stools.   Resp: + shortness of breath with exertion or at rest.  + excess mucus, + productive cough,  No non-productive cough,  No coughing up of blood.  No change in color of mucus.  No wheezing.  No chest wall deformity  Skin: no rash or lesions.  GU: no dysuria, change in color of urine, no urgency or frequency.  No flank pain, no hematuria   MS:  No joint pain or swelling.  No decreased range of motion.  No back pain.  Psych:  No change in mood or affect. No depression or anxiety.  No memory loss.   Vital Signs BP 138/82 (BP Location: Right Arm, Cuff Size: Normal)   Pulse 62   Temp 97.5 F (36.4 C) (Oral)   Ht 5\' 3"  (1.6 m)   Wt 121 lb (54.9 kg)   SpO2 96%   BMI 21.43 kg/m    Physical Exam:  General- No distress,  A&Ox3, peasant, tired appearing. ENT: No sinus tenderness, TM clear, pale nasal mucosa, no oral exudate,no post nasal drip, no LAN Cardiac: S1, S2, regular rate and rhythm, no murmur Chest: No wheeze/ rales/ diminished bilaterally; no accessory muscle use, no nasal flaring, no sternal retractions Abd.: Soft Non-tender Ext: No clubbing cyanosis, edema Neuro:  normal strength Skin: No rashes, warm and dry Psych: normal mood and behavior   Assessment/Plan  Bronchitis with Diarrhea ( suspected to be due  to Augmentin treatment) Plan: Stop taking the Augmentin now. We will make note that Augmentin gives you diarrhea. Start the z-pack tonight. Complete the z-pack as directed. Continue Probiotic as you have been doing. Plenty of fluids and rest. BRAT diet. Call if diarrhea does not clear so  we can check stool specimen. Follow up with Dr. Lake Bells October 10th at 3:45 as is already scheduled. Continue Continue expectant monitoring, infection prevention measures,  mucociliary clearance measures as you are able. Flu Shot when you are better as you have scheduled with your PCP. Please contact office for sooner follow up if symptoms do not improve or worsen or seek emergency care    Magdalen Spatz, NP 05/03/2016  10:28 AM

## 2016-05-03 NOTE — Patient Instructions (Addendum)
It is good to see you again today. Stop taking the Augmentin now. We will make note that Augmentin gives you diarrhea. Start the z-pack tonight. Complete the z-pack as directed. Plenty of fluids and rest. BRAT diet. Call if diarrhea does not clear so we can check stool specimen. Follow up with Dr. Lake Bells October 10th at 3:45 as is already scheduled. Continue Continue expectant monitoring, infection prevention measures,  mucociliary clearance measures as you are able. Flu Shot when you are better as you have scheduled with your PCP. Please contact office for sooner follow up if symptoms do not improve or worsen or seek emergency care

## 2016-05-06 ENCOUNTER — Telehealth: Payer: Self-pay | Admitting: *Deleted

## 2016-05-06 DIAGNOSIS — R195 Other fecal abnormalities: Secondary | ICD-10-CM

## 2016-05-06 NOTE — Telephone Encounter (Addendum)
-----   Message from Juanito Doom, MD sent at 05/06/2016  3:16 PM EDT ----- Hi Guys,  Please contact Ms. Parchman and let her know that I would like for her to have a C diff stool test.  Please arrange through our lab.  Judson Roch, I will be out of town next week, please look out for the results.  Thanks Ruby Cola ------  Called spoke with pt and is aware above. Orders has been placed. Pt will go to the lab for this. nothing further needed

## 2016-05-06 NOTE — Progress Notes (Signed)
Reviewed.  Will have our office arrange for C diff testing.

## 2016-05-10 ENCOUNTER — Other Ambulatory Visit: Payer: Self-pay | Admitting: Pulmonary Disease

## 2016-05-10 ENCOUNTER — Other Ambulatory Visit: Payer: Medicare Other

## 2016-05-10 DIAGNOSIS — R195 Other fecal abnormalities: Secondary | ICD-10-CM

## 2016-05-11 LAB — CLOSTRIDIUM DIFFICILE BY PCR: Toxigenic C. Difficile by PCR: NOT DETECTED

## 2016-05-12 ENCOUNTER — Encounter: Payer: Self-pay | Admitting: Pulmonary Disease

## 2016-05-12 ENCOUNTER — Telehealth: Payer: Self-pay | Admitting: Pulmonary Disease

## 2016-05-12 ENCOUNTER — Ambulatory Visit (INDEPENDENT_AMBULATORY_CARE_PROVIDER_SITE_OTHER): Payer: Medicare Other | Admitting: Pulmonary Disease

## 2016-05-12 ENCOUNTER — Ambulatory Visit: Payer: Medicare Other | Admitting: Pulmonary Disease

## 2016-05-12 VITALS — BP 120/76 | HR 103 | Temp 99.8°F | Ht 63.0 in | Wt 121.4 lb

## 2016-05-12 DIAGNOSIS — J479 Bronchiectasis, uncomplicated: Secondary | ICD-10-CM

## 2016-05-12 DIAGNOSIS — R05 Cough: Secondary | ICD-10-CM | POA: Diagnosis not present

## 2016-05-12 DIAGNOSIS — K449 Diaphragmatic hernia without obstruction or gangrene: Secondary | ICD-10-CM | POA: Insufficient documentation

## 2016-05-12 DIAGNOSIS — R059 Cough, unspecified: Secondary | ICD-10-CM

## 2016-05-12 MED ORDER — DOXYCYCLINE HYCLATE 100 MG PO TABS: 100.0000 mg | ORAL_TABLET | Freq: Two times a day (BID) | ORAL | 0 refills | Status: DC

## 2016-05-12 MED ORDER — BENZONATATE 100 MG PO CAPS: 100.0000 mg | ORAL_CAPSULE | Freq: Three times a day (TID) | ORAL | 0 refills | Status: AC | PRN

## 2016-05-12 NOTE — Telephone Encounter (Signed)
PFT 01/10/14: FVC 2.37 L (88%) FEV1 1.93 L (97%) FEV1/FVC 0.82 FEF 25-75 1.80 L (114%) negative bronchodilator response TLC 4.51 L (92%) RV 99% ERV 86% DLCO uncorrected 73%  IMAGING CXR PA/LAT 04/27/16 (personally reviewed by me): Chronic blunting of right costophrenic angle. No appreciated pleural effusion. No parenchymal nodule or opacity. Heart normal in size & mediastinum normal in contour.  MICROBIOLOGY Left Pleural Effusion (01/06/15):  Bacteria Negative / AFB Negative / Fungus Negative   LABS 01/05/15 ABG on RA:  7.46 / 38 / 70 / Sat 95%  01/17/14 IgG:  807 IgA:  260 IgM:  232 C3:  103 C4:  34

## 2016-05-12 NOTE — Patient Instructions (Addendum)
   Continue using your Robitussin to help suppress your cough.  I want you to start using Mucinex/Guaifenesin 600mg  twice daily with plenty of liquids to help get the mucus out.  You can use the [employer] I am prescribing to help with your cough.  I'm starting you on Doxycycline. Remember to take with a full glass of water & remain upright for 1 hour after to allow the pill to pass properly. Do not take the medication with any dairy products as they will bind it and make it ineffective. Also, avoid excessive sunlight while on it due to increased risk of sunburn.  We will keep your appointment as scheduled next week with Dr. Lake Bells. Call if you have any questions or concerns.   TESTS ORDERED: 1. Sputum Culture for Bacteria, AFB, & Fungus.

## 2016-05-12 NOTE — Progress Notes (Signed)
Subjective:    Patient ID: Tina Bell, female    DOB: 1936-03-10, 80 y.o.   MRN: [id number]  [facility].:  Acute visit for Cough with known Bronchiectasis.  HPI Cough: She has been treated with repeat courses of antibiotics including azithromycin and Augmentin. Reportedly she had symptoms starting in September after traveling on a cruise ship with multiple passengers with coughing. Both she and her husband had a sore throat and sinus congestion with drainage. She used a Z-pack at that time. This was followed by Augmentin but she was intolerant due to diarrhea. She then went back on a Z-pack on 9/25. She reports Robitussin does seem to supress her cough. Cough produces a green mucus. She reports if she doesn't take Robitussin she will wake up coughing.   Bronchiectasis: Patient had severe pertussis as a child. Does have nodularity suggestive of NTM infection. Previously using cough suppression as Robitussin. Not currently on any airway clearance. No wheezing or dyspnea above her baseline.  Review of Systems Patient did have a subjective fever yesterday. Her maximum temp was 103.26F at home after her return in September. She has had some chills. No recent sweats. No chest tightness, pressure, or pain. She reports her diarrhea has resolved.  Allergies  Allergen Reactions  . Keflex [Cephalexin] Hives    Tolerated Augmentin  . Morphine And Related Hives  . Macrodantin [Nitrofurantoin Macrocrystal] Rash    Do not take    Current Outpatient Prescriptions on File Prior to Visit  Medication Sig Dispense Refill  . brimonidine (ALPHAGAN P) 0.1 % SOLN Place 1 drop into the right eye 3 (three) times daily.    . timolol (TIMOPTIC) 0.5 % ophthalmic solution 1 drop in the right eye    . Calcium Carb-Cholecalciferol (CALCIUM 600 + D PO) Take 1 tablet by mouth 2 (two) times daily. Reported on 01/20/2016    . Calcium Carbonate-Vit D-Min (CALCIUM 1200 PO) Take 1 capsule by mouth daily.     Current  Facility-Administered Medications on File Prior to Visit  Medication Dose Route Frequency Provider Last Rate Last Dose  . levofloxacin (LEVAQUIN) IVPB 500 mg  500 mg Intravenous Q24H Festus Aloe, MD   500 mg at 11/28/15 1139    Past Medical History:  Diagnosis Date  . Arthritis    knees  . Borderline glaucoma    right eye  . Bronchiectasis ([place])    pulmologist-  dr Lake Bells--   (  per note secondary to severe pertussis as child)   stable per LOV note 10-03-2015  . H/O pleural empyema    w/ CAP  June 2016  s/p   VAT's with thoracotomy drainage empyema and decortication right lower lobe  . Hiatal hernia   . History of kidney stones [id number]  . History of kidney stones   . History of pertussis as a child    severe   . History of recurrent UTIs   . PONV (postoperative nausea and vomiting)    and difficulty waking up  . Renal calculus, left   . Thoracic ascending aortic aneurysm (HCC)    4 cm   per last ct 08-06-2015  (consult by dr Lucianne Lei tright 08-13-2015)  . Urgency of urination   . Wears glasses     Past Surgical History:  Procedure Laterality Date  . CATARACT EXTRACTION W/ INTRAOCULAR LENS IMPLANT Left March 2017  . CYSTOSCOPY WITH URETEROSCOPY AND STENT PLACEMENT Left 11/07/2015   Procedure: CYSTOSCOPY LEFT URETEROSCOPY WITH LEFT RETROGRADE PYELOGRAM;  Surgeon: Festus Aloe, MD;  Location: [facility];  Service: Urology;  Laterality: Left;  . CYSTOSCOPY/URETEROSCOPY/HOLMIUM LASER/STENT PLACEMENT Left 11/28/2015   Procedure: CYSTOSCOPY LEFT /URETEROSCOPY/HOLMIUM LASER/STENT REPLACEMENT;  Surgeon: Festus Aloe, MD;  Location: [facility];  Service: Urology;  Laterality: Left;  . DILATION AND CURETTAGE OF UTERUS  x3  1980's  . EXTRACORPOREAL SHOCK WAVE LITHOTRIPSY  07-09-2013  . HOLMIUM LASER APPLICATION Left [id number]   Procedure: LEFT STENT PLACEMENT;  Surgeon: Festus Aloe, MD;  Location: [facility];  Service: Urology;  Laterality: Left;  . HOLMIUM LASER  APPLICATION Left [id number]   Procedure: HOLMIUM LASER APPLICATION;  Surgeon: Festus Aloe, MD;  Location: [facility];  Service: Urology;  Laterality: Left;  . KNEE ARTHROSCOPY Right 1999  . NASAL SEPTUM SURGERY  1989  . PUBOVAGINAL SLING  1998   bone anchor  . STONE EXTRACTION WITH BASKET Left 11/28/2015   Procedure: STONE EXTRACTION WITH BASKET;  Surgeon: Festus Aloe, MD;  Location: [facility];  Service: Urology;  Laterality: Left;  . TOTAL KNEE ARTHROPLASTY Left 05/12/2015   Procedure: LEFT TOTAL KNEE ARTHROPLASTY;  Surgeon: Gaynelle Arabian, MD;  Location: [facility];  Service: Orthopedics;  Laterality: Left;  . TUBAL LIGATION  1974  . VIDEO ASSISTED THORACOSCOPY (VATS)/EMPYEMA Right 01/06/2015   Procedure: VIDEO ASSISTED THORACOSCOPY (VATS)/EMPYEMA AND DRAINAGE OF EMPYEMA;  Surgeon: Ivin Poot, MD;  Location: [facility];  Service: Thoracic;  Laterality: Right;    Family History  Problem Relation Age of Onset  . Cancer Mother     ovarian  . Cancer Sister     breast    Social History   Social History  . Marital status: Married    Spouse name: N/A  . Number of children: N/A  . Years of education: N/A   Social History Main Topics  . Smoking status: Former Smoker    Packs/day: 0.25    Years: 5.00    Types: Cigarettes    Quit date: 05/03/1962  . Smokeless tobacco: Never Used     Comment: 1 PPWEEK  . Alcohol use Yes     Comment: occasional   . Drug use: No  . Sexual activity: Not Asked   Other Topics Concern  . None   Social History Narrative  . None      Objective:   Physical Exam BP 120/76 (BP Location: Left Arm, Cuff Size: Normal)   Pulse (!) 103   Temp 99.8 F (37.7 C) (Oral)   Ht 5\' 3"  (1.6 m)   Wt 121 lb 6.4 oz (55.1 kg)   SpO2 95%   BMI 21.51 kg/m  General:  Awake. Alert. No distress. Accompanied by husband today. Integument:  Warm & dry. No rash on exposed skin.  HEENT:  Moist mucus membranes. No oral ulcers. No  scleral injection or icterus.  Cardiovascular:  Regular rate. No edema. Normal S1 & S2.  Pulmonary:  Good aeration & clear to auscultation bilaterally. Symmetric chest wall expansion. No accessory muscle use on room air. Abdomen: Soft. Normal bowel sounds. Nondistended. Grossly nontender. Musculoskeletal:  Normal bulk and tone. No joint deformity or effusion appreciated.  PFT 01/10/14: FVC 2.37 L (88%) FEV1 1.93 L (97%) FEV1/FVC 0.82 FEF 25-75 1.80 L (114%) negative bronchodilator response TLC 4.51 L (92%) RV 99% ERV 86% DLCO uncorrected 73%  IMAGING CXR PA/LAT 04/27/16 (personally reviewed by me): Chronic blunting of right costophrenic angle. No appreciated pleural effusion. No parenchymal nodule or opacity. Heart normal in size &  mediastinum normal in contour.  MICROBIOLOGY Left Pleural Effusion (01/06/15):  Bacteria Negative / AFB Negative / Fungus Negative   LABS 01/05/15 ABG on RA:  7.46 / 38 / 70 / Sat 95%  01/17/14 IgG:  807 IgA:  260 IgM:  232 C3:  103 C4:  34    Assessment & Plan:  80 y.o. female with Known bronchiectasis and ongoing cough after a recent overseas cruise. I reviewed the patient's chest x-ray which showed no focal opacity to suggest a pneumonia. I suspect her ongoing cough could be post viral but given the ongoing fever and increased quantity of mucus as well as color change there is certainly a possibility that she has a bacterial superinfection. The patient and I reviewed the patient's previous microbiology which does not show any history of Pseudomonas that could indicate a resistance to the previous and currently proposed antibiotic regimens. I instructed the patient to notify my office if she had any worsening in her cough or breathing before her next appointment. I do not feel a course of steroids are necessary at this time in the absence of wheezing. Of note, the patient did not have any cough during entire interview with me today which lasted for  approximately 20 minutes.  1. Cough: Likely resolving acute bronchitis. Starting doxycycline 100 mg by mouth twice a day 10 days. Checking sputum culture for AFB, fungus, and bacteria. Cough suppression ongoing with Robitussin and Tessalon Perles. Advised using Mucinex/guaifenesin twice daily with plenty of liquids. 2. Bronchiectasis: No signs of acute exacerbation. 3. Follow-up: Patient to keep follow-up appointment on 10/10 with Dr. Lake Bells as scheduled.  Sonia Baller Ashok Cordia, M.D. [facility] Pager:  [phone] After 3pm or if no response, call [phone] 3:11 PM 05/12/16

## 2016-05-13 ENCOUNTER — Other Ambulatory Visit: Payer: Medicare Other

## 2016-05-13 ENCOUNTER — Other Ambulatory Visit: Payer: Self-pay | Admitting: Pulmonary Disease

## 2016-05-13 DIAGNOSIS — J479 Bronchiectasis, uncomplicated: Secondary | ICD-10-CM

## 2016-05-16 LAB — RESPIRATORY CULTURE OR RESPIRATORY AND SPUTUM CULTURE: Organism ID, Bacteria: NORMAL

## 2016-05-18 ENCOUNTER — Ambulatory Visit (INDEPENDENT_AMBULATORY_CARE_PROVIDER_SITE_OTHER): Payer: Medicare Other | Admitting: Pulmonary Disease

## 2016-05-18 ENCOUNTER — Encounter: Payer: Self-pay | Admitting: Pulmonary Disease

## 2016-05-18 DIAGNOSIS — J470 Bronchiectasis with acute lower respiratory infection: Secondary | ICD-10-CM

## 2016-05-18 NOTE — Assessment & Plan Note (Signed)
She has experienced a flareup of bronchiectasis over the last month since she went on a cruise. She is now starting to improve with doxycycline. She will complete 4 more days worth of therapy which I think is appropriate.  She does not have problems at mucociliary clearance right now so I don't think there be any benefit to adding anything like a flutter valve.  Plan: Follow-up sputum culture results including AFB and fungal studies Complete doxycycline Get a flu shot in about 3 weeks Follow-up with me in 4 weeks to make sure things are headed in the right direction Exercise more frequently

## 2016-05-18 NOTE — Patient Instructions (Signed)
Finish the doxycycline as prescribed Keep taking the probiotic Exercise more frequently Get a flu shot this month Come back and see me in November (about 4 weeks from now)

## 2016-05-18 NOTE — Progress Notes (Signed)
Subjective:    Patient ID: Tina Bell, female    DOB: [birth year], 80 y.o.   MRN: [id number]  Synopsis: First referred to [facility] in 2015. Found to have bronchiectasis on high-resolution CT in 2015 this is believed to be due to a severe pertussis infection as a child. Never smoked.  In June 2016 she had community acquired pneumonia with an empyema required VATs 12/2013 CT chest high res (entrikin read)> scattered throughout the lungs, particularly in the lingula and RML, there is cylindrical bronchiectasis and bronchiolectasis with nodularity suggestive of MAI, extensive air trapping and atherosclerosis, small hiatal hernia June 2015 pulmonary function test> normal ratio, FEV1 93% predicted, no change with bronchodilator, total lung capacity normal, DLCO slightly low at 73% predicted   HPI  Chief Complaint  Patient presents with  . Follow-up    4-6wk rov. pt states breathing has improved since acute visit with JN on 10-6. pt c/o dry cough at times prod with yellow to greenish mucus & occ chest discomfort.     She is feeling better after taking the doxycyline.    She was quite ill after her cruise, the cough started nearly as soon as she got on her cruise and lasted for the entire cruise.  She has been coughing up mucus since then.  She has taken many different antibiotics in the last few weeks, z-pack, augmentin, zpack again and then doxycycline.    She had some diarrhea which is now better.  She has no trouble clearing mucus, it is coming up OK.    No shortness of breath.   She no longer has fever or chills, though she had a fever to 103 apparently prior to seeing Dr. Ashok Cordia a week ago.    Past Medical History:  Diagnosis Date  . Arthritis    knees  . Borderline glaucoma    right eye  . Bronchiectasis ([place])    pulmologist-  dr Lake Bells--   (  per note secondary to severe pertussis as child)   stable per LOV note 10-03-2015  . H/O pleural empyema    w/ CAP  June  2016  s/p   VAT's with thoracotomy drainage empyema and decortication right lower lobe  . Hiatal hernia   . History of kidney stones [id number]  . History of kidney stones   . History of pertussis as a child    severe   . History of recurrent UTIs   . PONV (postoperative nausea and vomiting)    and difficulty waking up  . Renal calculus, left   . Thoracic ascending aortic aneurysm (HCC)    4 cm   per last ct 08-06-2015  (consult by dr Lucianne Lei tright 08-13-2015)  . Urgency of urination   . Wears glasses      Review of Systems  Constitutional: Negative for chills, fatigue and fever.  HENT: Negative for rhinorrhea and sinus pressure.   Respiratory: Negative for cough, chest tightness, shortness of breath and wheezing.   Cardiovascular: Negative for chest pain, palpitations and leg swelling.       Objective:   Physical Exam Vitals:   05/18/16 1542  BP: 118/72  BP Location: Left Arm  Pulse: 82  SpO2: 93%  Weight: 120 lb (54.4 kg)  Height: 5\' 3"  (1.6 m)   RA  Gen: no acute distress HEENT: NCAT, OP clear, PULM: CTA B, few crackles R base, some wheezes left side as well CV: RRR, no mgr, no JVD AB: BS+, soft,  nontender, no hsm Ext: warm, no edema, no clubbing, no cyanosis Derm: no rash or skin breakdown  Chest x-ray images from last month reviewed showing some scarring in the right base but no pleural effusion  Records from my partners reviewed where she was cared for for bronchiectasis flareup.      Assessment & Plan:   Bronchiectasis without acute exacerbation ([place]) She has experienced a flareup of bronchiectasis over the last month since she went on a cruise. She is now starting to improve with doxycycline. She will complete 4 more days worth of therapy which I think is appropriate.  She does not have problems at mucociliary clearance right now so I don't think there be any benefit to adding anything like a flutter valve.  Plan: Follow-up sputum culture results  including AFB and fungal studies Complete doxycycline Get a flu shot in about 3 weeks Follow-up with me in 4 weeks to make sure things are headed in the right direction Exercise more frequently   Updated Medication List Outpatient Encounter Prescriptions as of 05/18/2016  Medication Sig  . benzonatate (TESSALON PERLES) 100 MG capsule Take 1 capsule (100 mg total) by mouth 3 (three) times daily as needed for cough.  . Calcium Carbonate-Vit D-Min (CALCIUM 1200 PO) Take 1 capsule by mouth daily.  Marland Kitchen doxycycline (VIBRA-TABS) 100 MG tablet Take 1 tablet (100 mg total) by mouth 2 (two) times daily.  . timolol (TIMOPTIC) 0.5 % ophthalmic solution 1 drop in the right eye  . [DISCONTINUED] brimonidine (ALPHAGAN P) 0.1 % SOLN Place 1 drop into the right eye 3 (three) times daily.  . [DISCONTINUED] Calcium Carb-Cholecalciferol (CALCIUM 600 + D PO) Take 1 tablet by mouth 2 (two) times daily. Reported on 01/20/2016   Facility-Administered Encounter Medications as of 05/18/2016  Medication  . levofloxacin (LEVAQUIN) IVPB 500 mg

## 2016-06-14 IMAGING — CR DG CHEST 1V
1 series · 1 of 1 positions shown · non-contrast
Comparison: 01/01/2015 and current chest CT.

CLINICAL DATA: Status post right-sided thoracentesis.

EXAM:
CHEST  1 VIEW

[chest pa]
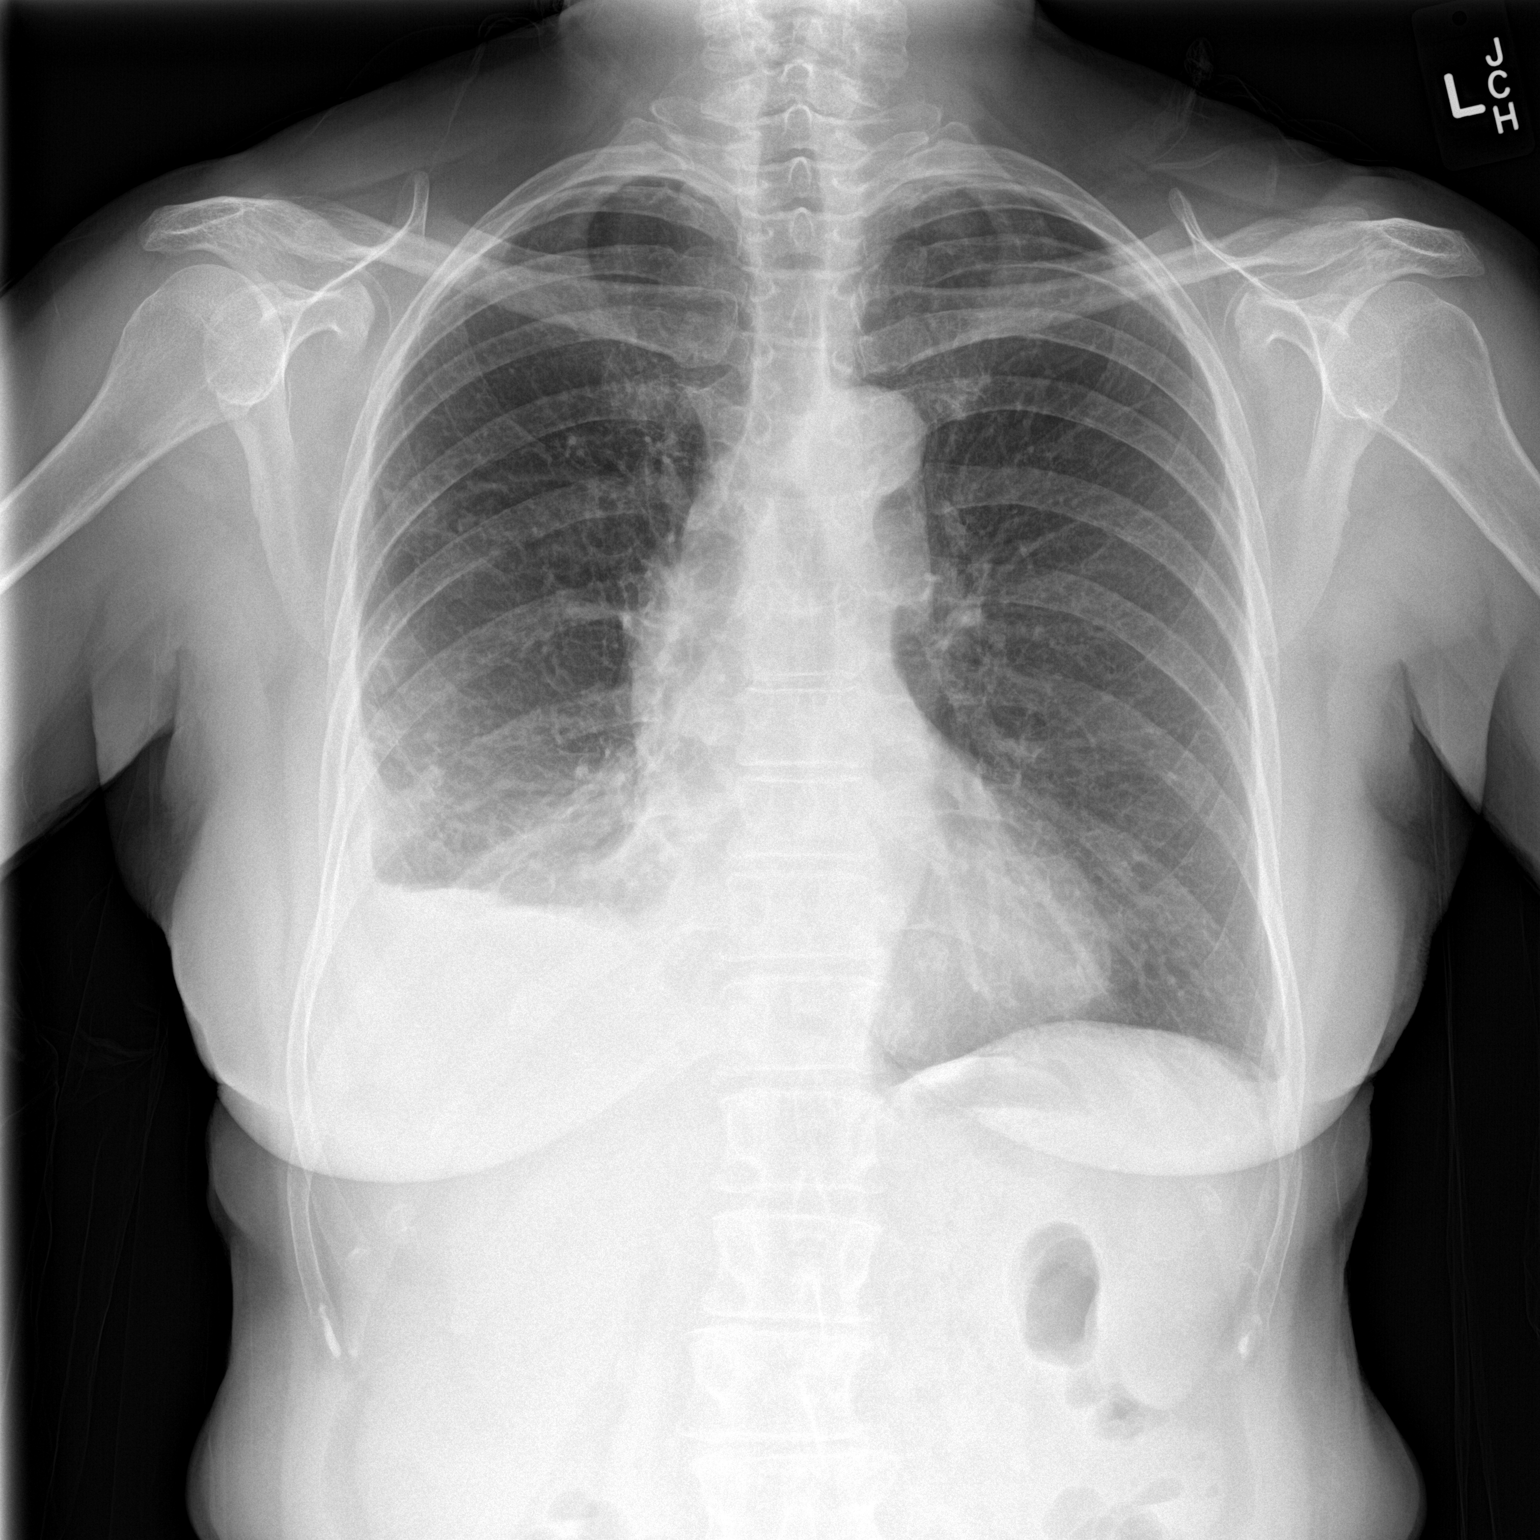

[1 of 1 positions shown; findings below may reference images not displayed]

FINDINGS: Opacity at the right lung base is similar to the prior chest
radiograph. Although a component of this may reflect residual fluid.
Patchy of this is likely consolidated lung.

There is no pneumothorax.  No left pleural effusion.
IMPRESSION: 1. No pneumothorax following right-sided thoracentesis. Lung base
opacity on the right is without significant change when compared to
the prior chest radiograph.

## 2016-06-17 IMAGING — CR DG CHEST 1V PORT
1 series · 1 of 1 positions shown · non-contrast
Comparison: 01/06/2015

CLINICAL DATA: Postop

EXAM:
PORTABLE CHEST - 1 VIEW

[AP]
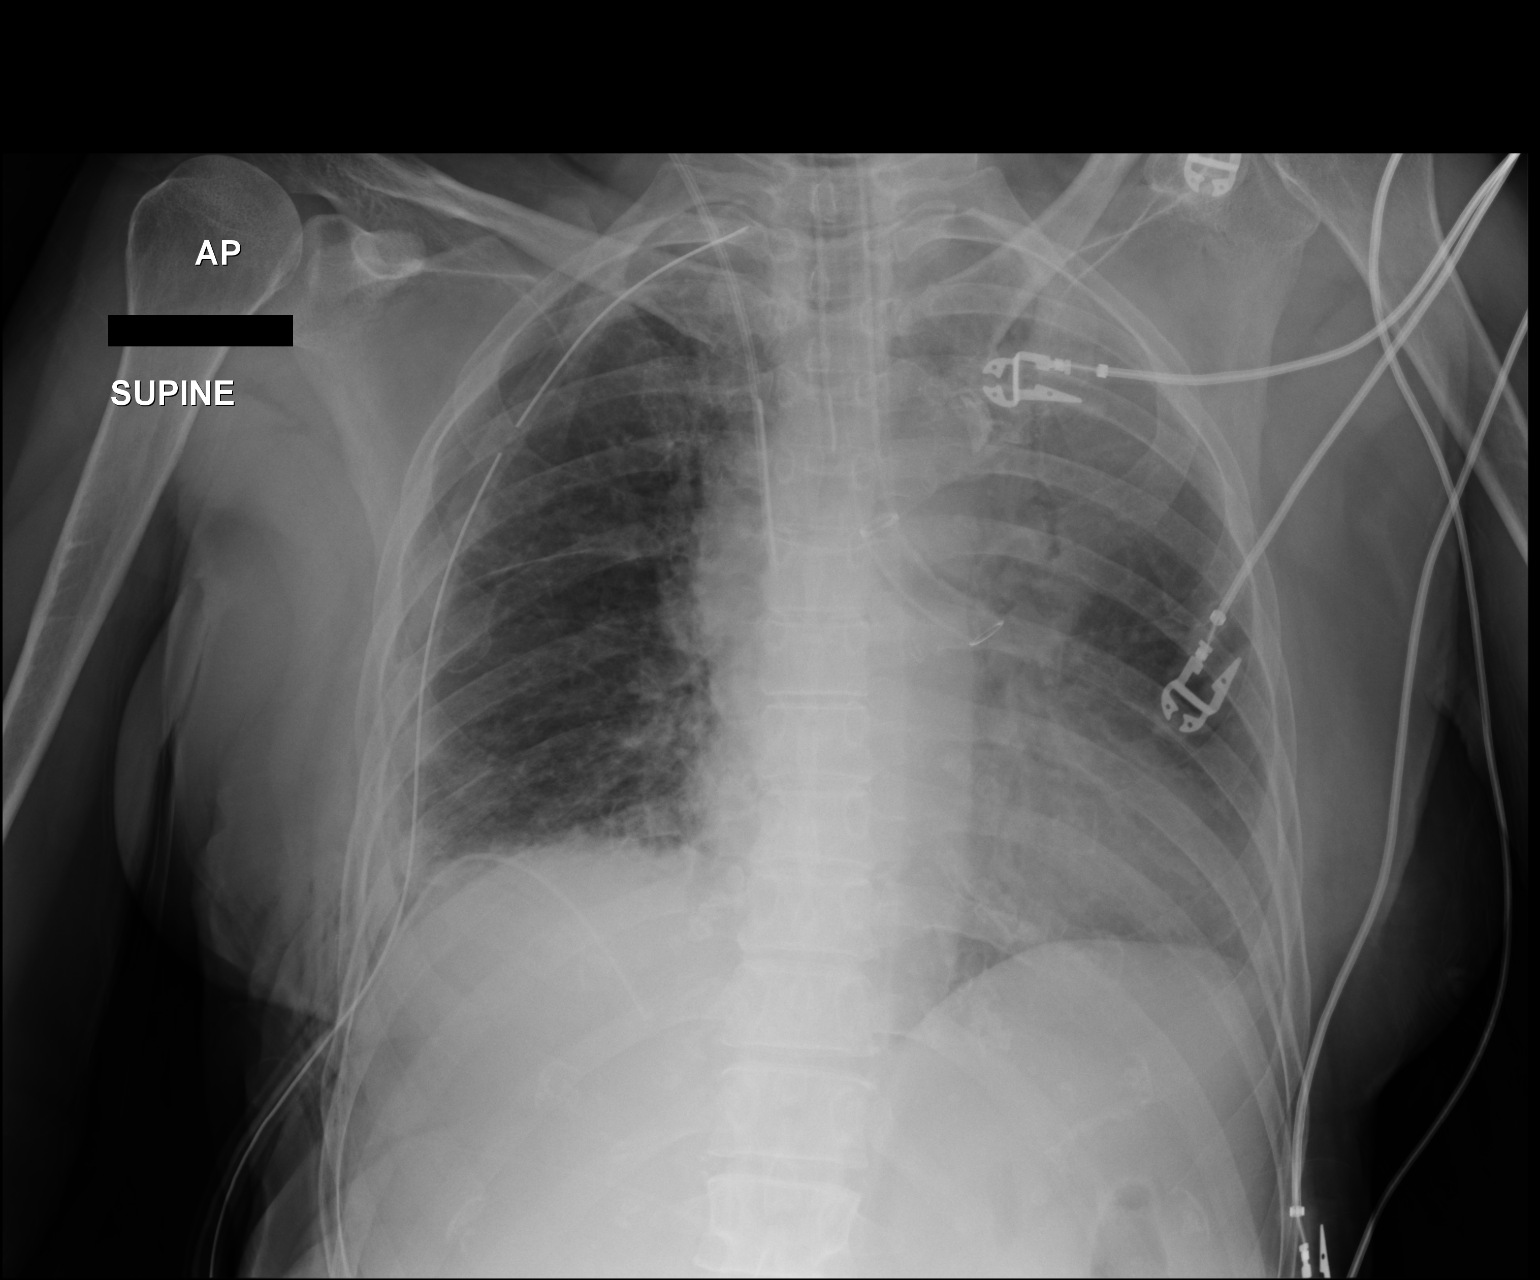

[1 of 1 positions shown; findings below may reference images not displayed]

FINDINGS: Borderline cardiomegaly is noted. There is double lumen endotracheal
tube with lower tip in left main bronchus. Mild atelectasis noted in
left upper lobe. Two right-sided chest tubes are noted. No
pneumothorax.
IMPRESSION: There is double lumen endotracheal tube with lower tip in left main
bronchus. Mild atelectasis noted in left upper lobe. Two right-sided
chest tubes are noted. No pneumothorax.

## 2016-06-17 IMAGING — CR DG CHEST 1V PORT
1 series · 1 of 1 positions shown · non-contrast
Comparison: Single view of the chest earlier this same day.

CLINICAL DATA: Central line placement.

EXAM:
PORTABLE CHEST - 1 VIEW

[AP]
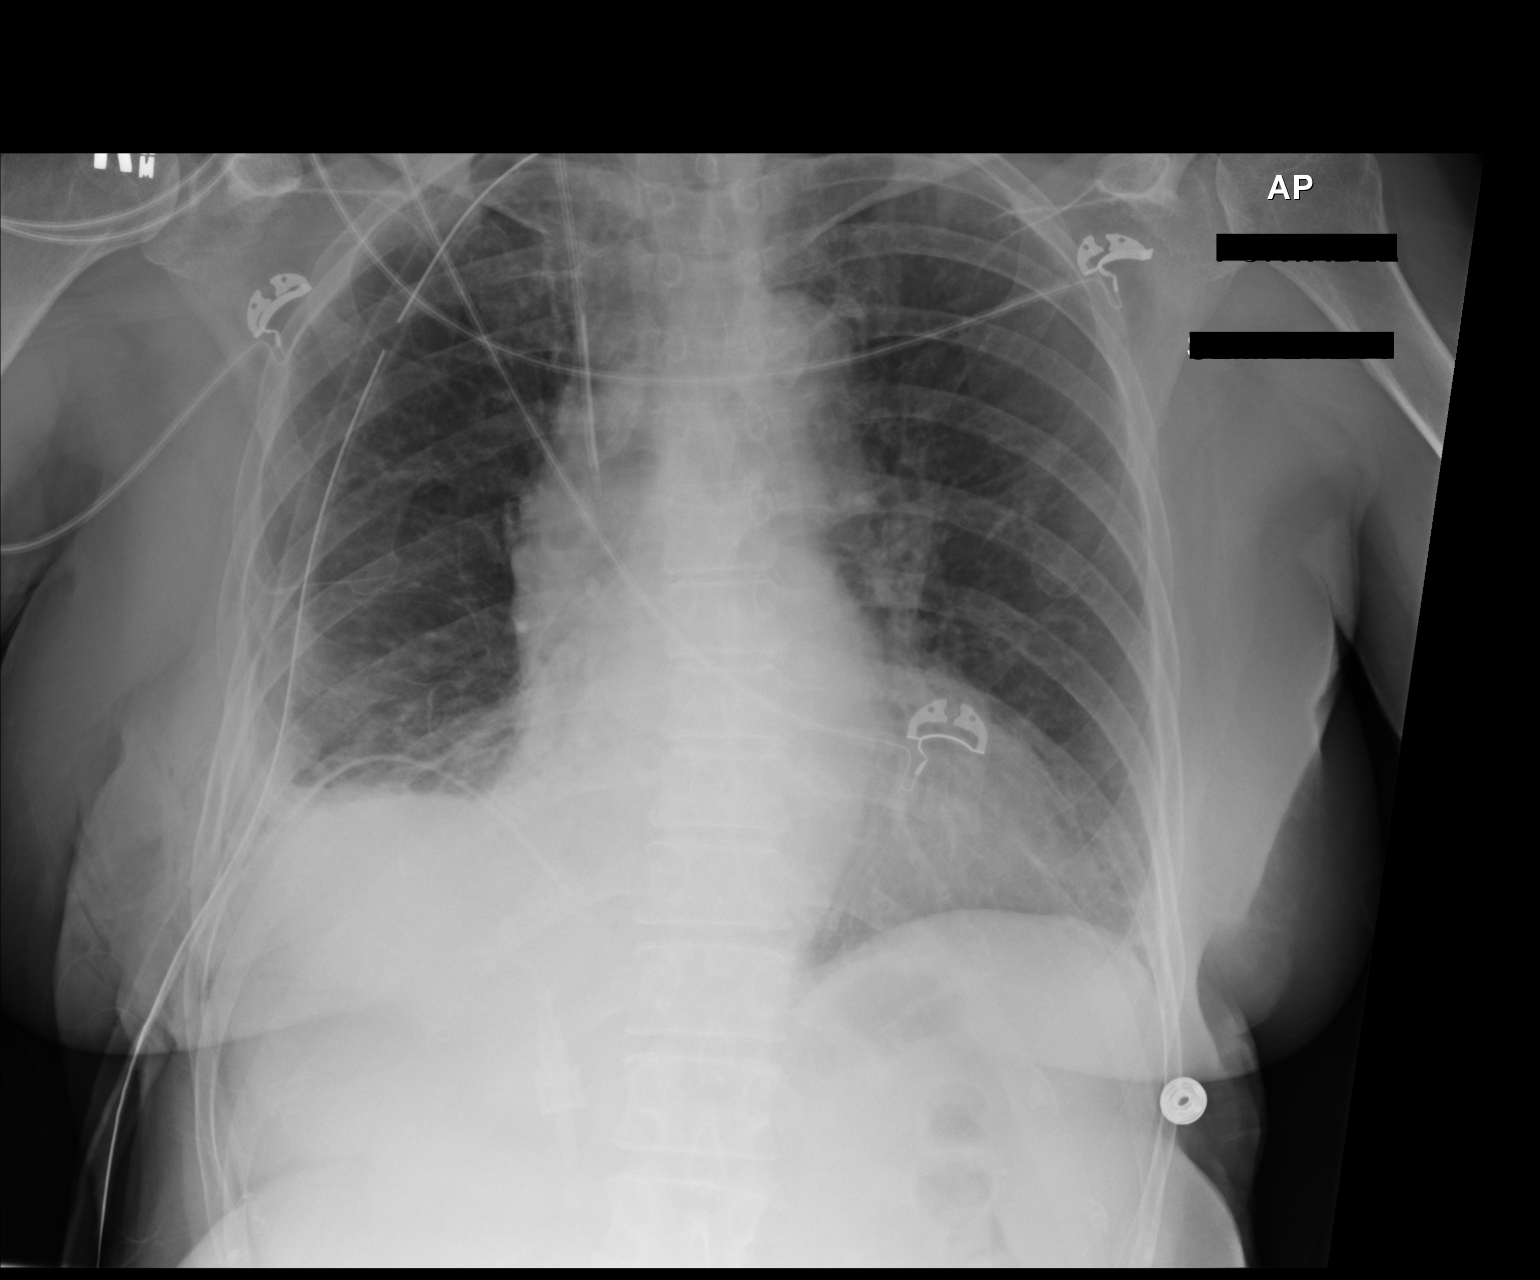

[1 of 1 positions shown; findings below may reference images not displayed]

FINDINGS: Right IJ catheter is identified with the tip projecting over the
lower superior vena cava. 2 right chest tubes are in place.
Endotracheal tube has been removed. No pneumothorax identified.
Small right effusion and basilar airspace disease are seen.
IMPRESSION: Right IJ catheter tip projects over the lower superior vena cava. No
pneumothorax with 2 right chest tubes in place.

Endotracheal tube has been removed.

Small right effusion and basilar airspace disease.

## 2016-06-17 IMAGING — DX DG CHEST 2V
2 series · 2 of 2 positions shown · non-contrast
Comparison: 01/05/2015

CLINICAL DATA: Effusion and shortness of Breath

EXAM:
CHEST  2 VIEW

[chest pa]
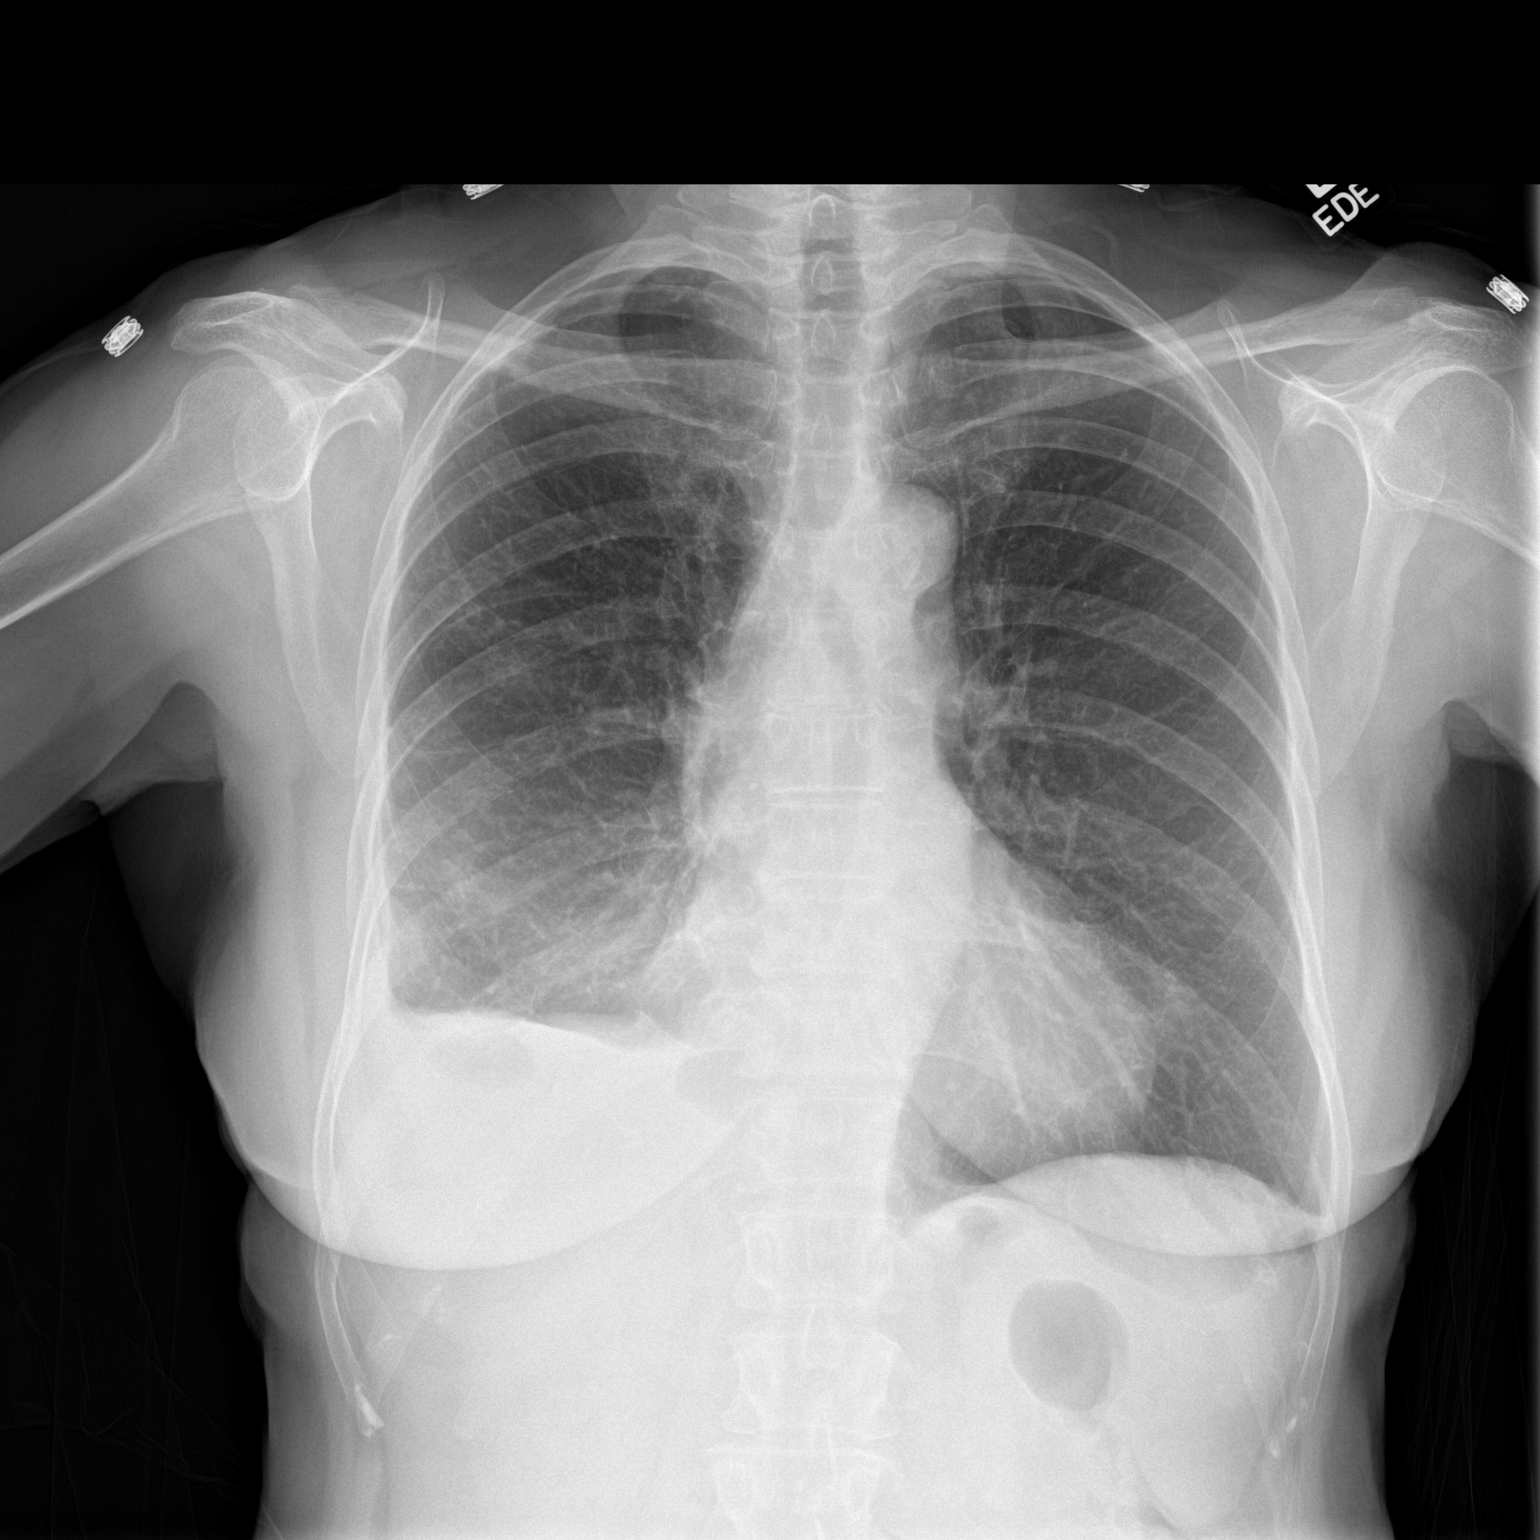

[chest lat]
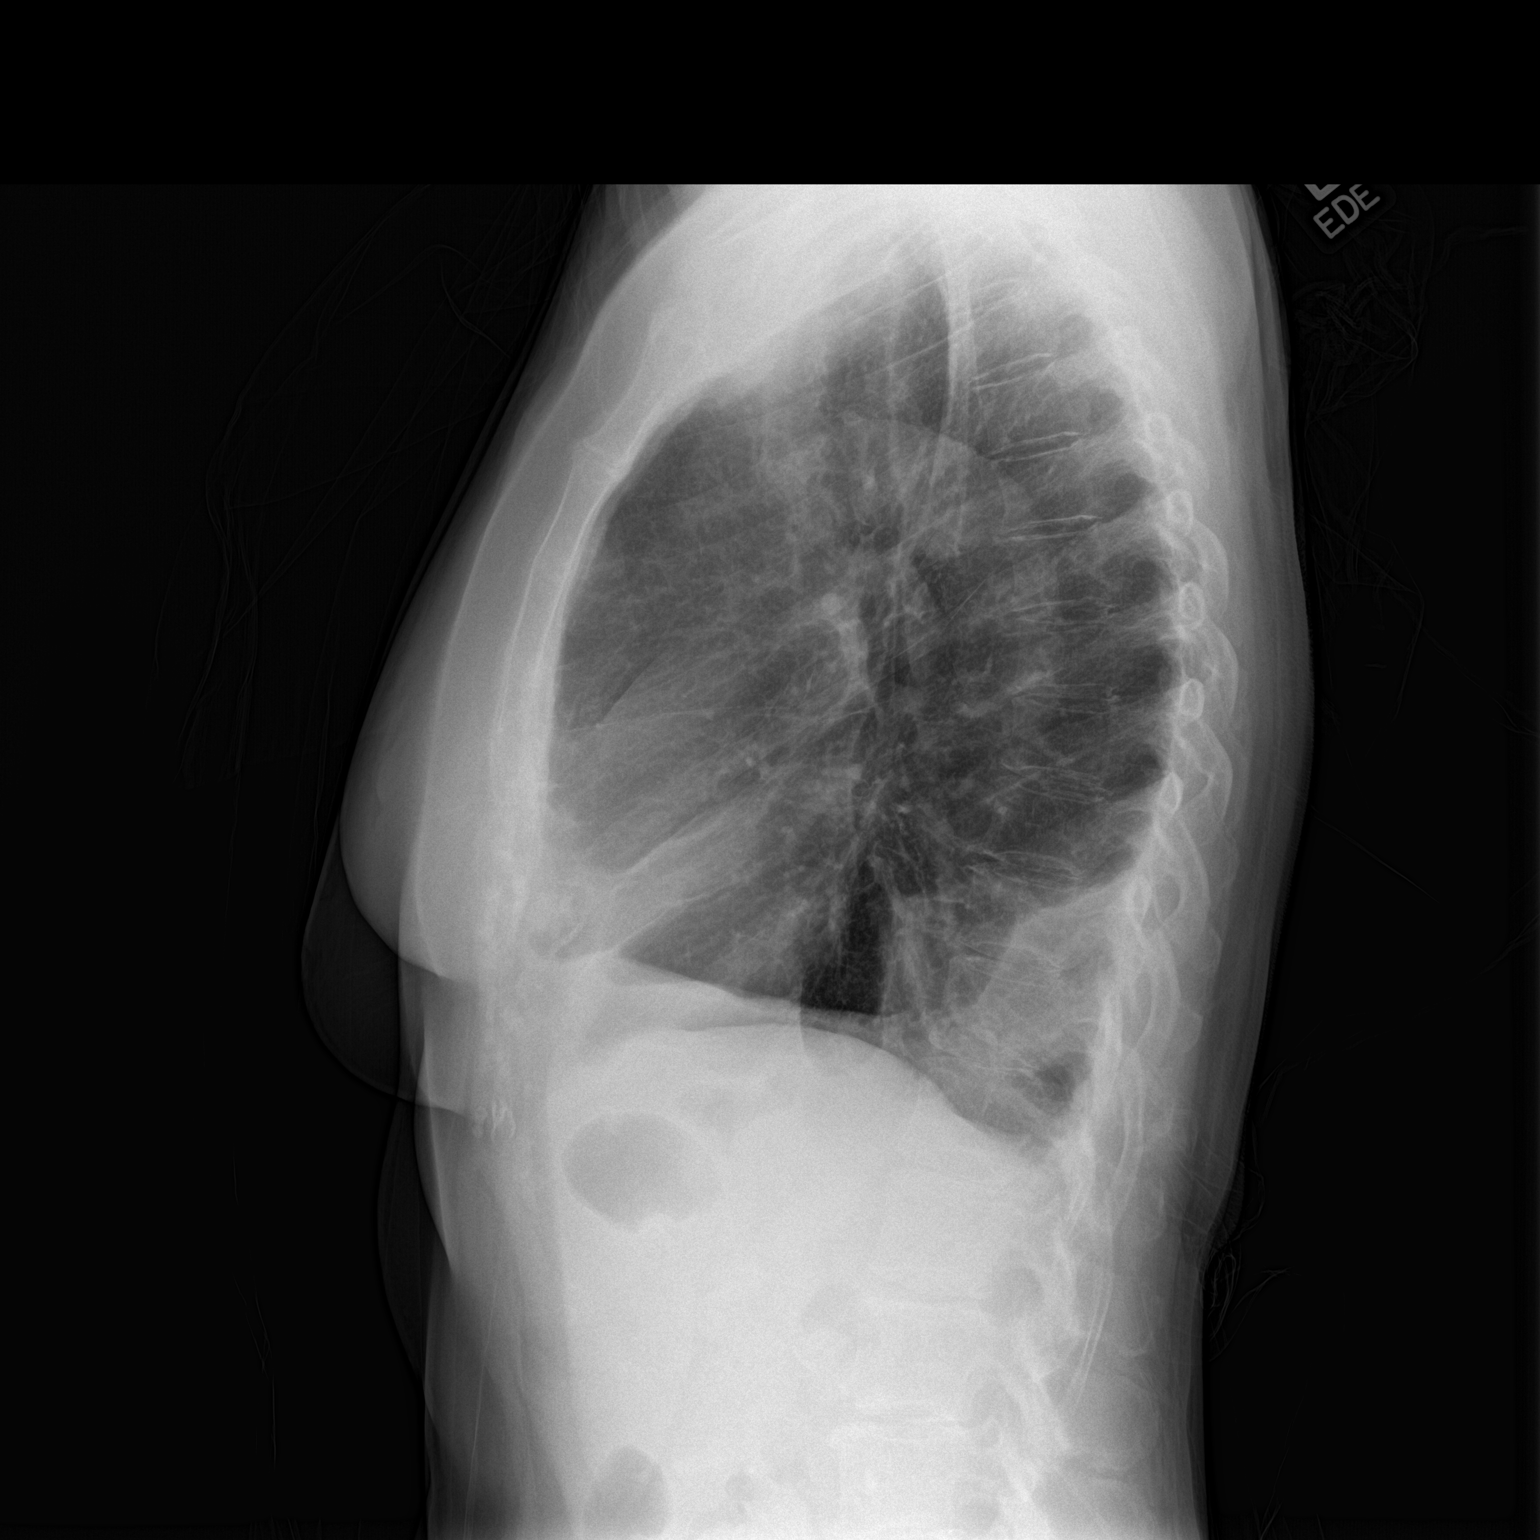

[2 of 2 positions shown; findings below may reference images not displayed]

FINDINGS: The heart size appears normal. No pleural effusion or edema. No
airspace consolidation identified. There is a loculated right
pleural effusion containing gas identified which appears unchanged
in volume from previous exam. Overlying compressive type atelectasis
noted.
IMPRESSION: 1. No change in loculated, gas containing right pleural effusion
with overlying consolidation.

## 2016-06-21 ENCOUNTER — Ambulatory Visit (INDEPENDENT_AMBULATORY_CARE_PROVIDER_SITE_OTHER): Payer: Medicare Other | Admitting: Pulmonary Disease

## 2016-06-21 ENCOUNTER — Encounter: Payer: Self-pay | Admitting: Pulmonary Disease

## 2016-06-21 DIAGNOSIS — J479 Bronchiectasis, uncomplicated: Secondary | ICD-10-CM | POA: Diagnosis not present

## 2016-06-21 MED ORDER — DOXYCYCLINE HYCLATE 100 MG PO TABS: 100.0000 mg | ORAL_TABLET | Freq: Two times a day (BID) | ORAL | 0 refills | Status: DC

## 2016-06-21 NOTE — Progress Notes (Signed)
Subjective:    Patient ID: Tina Bell, female    DOB: 10-Jul-1936, 80 y.o.   MRN: [id number]  Synopsis: First referred to [facility] in 2015. Found to have bronchiectasis on high-resolution CT in 2015 this is believed to be due to a severe pertussis infection as a child. Never smoked.  In June 2016 she had community acquired pneumonia with an empyema required VATs 12/2013 CT chest high res (entrikin read)> scattered throughout the lungs, particularly in the lingula and RML, there is cylindrical bronchiectasis and bronchiolectasis with nodularity suggestive of MAI, extensive air trapping and atherosclerosis, small hiatal hernia June 2015 pulmonary function test> normal ratio, FEV1 93% predicted, no change with bronchodilator, total lung capacity normal, DLCO slightly low at 73% predicted   HPI  Chief Complaint  Patient presents with  . Follow-up    pt feeling much better, has no breathing complaints today.     Tina Bell is doing well.  She has not had any difficulty breathing lately.  She has not quite been back to playing tennis lately.  She is nervous about this because of her knee.    She says her lungs are doing well.  No mucus production right now. No dyspnea, no wheezing.     Past Medical History:  Diagnosis Date  . Arthritis    knees  . Borderline glaucoma    right eye  . Bronchiectasis ([place])    pulmologist-  dr [place] Bells--   (  per note secondary to severe pertussis as child)   stable per LOV note 10-03-2015  . H/O pleural empyema    w/ CAP  June 2016  s/p   VAT's with thoracotomy drainage empyema and decortication right lower lobe  . Hiatal hernia   . History of kidney stones [id number]  . History of kidney stones   . History of pertussis as a child    severe   . History of recurrent UTIs   . PONV (postoperative nausea and vomiting)    and difficulty waking up  . Renal calculus, left   . Thoracic ascending aortic aneurysm (HCC)    4 cm   per last ct 08-06-2015   (consult by dr Lucianne Lei tright 08-13-2015)  . Urgency of urination   . Wears glasses      Review of Systems  Constitutional: Negative for chills, fatigue and fever.  HENT: Negative for rhinorrhea and sinus pressure.   Respiratory: Negative for cough, chest tightness, shortness of breath and wheezing.   Cardiovascular: Negative for chest pain, palpitations and leg swelling.       Objective:   Physical Exam Vitals:   06/21/16 1604  BP: 126/66  BP Location: Right Arm  Cuff Size: Normal  Pulse: (!) 53  SpO2: 96%  Weight: 123 lb 12.8 oz (56.2 kg)  Height: 5\' 3"  (1.6 m)   RA  Gen: no acute distress HEENT: NCAT, OP clear, PULM: CTA B, few crackles R base, some wheezes left side as well CV: RRR, no mgr, no JVD AB: BS+, soft, nontender, no hsm Ext: warm, no edema, no clubbing, no cyanosis Derm: no rash or skin breakdown  October 2017 sputum culture negative for fungal, AFB, bacterial.      Assessment & Plan:   Bronchiectasis without acute exacerbation ([place]) This is been a stable interval for Tina Bell without exacerbation.   Plan: Today we talked about preventative measures she can take when she goes on her next trip as every time she goes out of  the country she ends up having a flare bronchiectasis. We talked about the importance of hand hygiene, frequent handwashing, immunizations. In addition, we will give her a prescription of doxycycline to take with her as this seems to work better for her. I also think it may be helpful for her to take Breo 1 puff daily when she goes on the trip.   Updated Medication List Outpatient Encounter Prescriptions as of 06/21/2016  Medication Sig  . benzonatate (TESSALON PERLES) 100 MG capsule Take 1 capsule (100 mg total) by mouth 3 (three) times daily as needed for cough.  . Calcium Carbonate-Vit D-Min (CALCIUM 1200 PO) Take 1 capsule by mouth daily.  . timolol (TIMOPTIC) 0.5 % ophthalmic solution 1 drop in the right eye  . doxycycline  (VIBRA-TABS) 100 MG tablet Take 1 tablet (100 mg total) by mouth 2 (two) times daily.  . [DISCONTINUED] doxycycline (VIBRA-TABS) 100 MG tablet Take 1 tablet (100 mg total) by mouth 2 (two) times daily. (Patient not taking: Reported on 06/21/2016)   Facility-Administered Encounter Medications as of 06/21/2016  Medication  . levofloxacin (LEVAQUIN) IVPB 500 mg

## 2016-06-21 NOTE — Patient Instructions (Signed)
Call us before your next trip and we will give you a sample of Breo to take 1 puff daily We will see you back in 6 months or sooner if needed

## 2016-06-21 NOTE — Assessment & Plan Note (Signed)
This is been a stable interval for Tina Bell without exacerbation.   Plan: Today we talked about preventative measures she can take when she goes on her next trip as every time she goes out of the country she ends up having a flare bronchiectasis. We talked about the importance of hand hygiene, frequent handwashing, immunizations. In addition, we will give her a prescription of doxycycline to take with her as this seems to work better for her. I also think it may be helpful for her to take Breo 1 puff daily when she goes on the trip.

## 2016-07-02 LAB — FUNGUS CULTURE W SMEAR

## 2016-07-02 LAB — AFB CULTURE WITH SMEAR (NOT AT ARMC)

## 2016-08-05 ENCOUNTER — Other Ambulatory Visit: Payer: Self-pay | Admitting: Cardiothoracic Surgery

## 2016-08-05 DIAGNOSIS — R918 Other nonspecific abnormal finding of lung field: Secondary | ICD-10-CM

## 2016-08-31 ENCOUNTER — Encounter: Payer: Self-pay | Admitting: Pulmonary Disease

## 2016-08-31 ENCOUNTER — Ambulatory Visit (INDEPENDENT_AMBULATORY_CARE_PROVIDER_SITE_OTHER): Payer: Medicare Other | Admitting: Pulmonary Disease

## 2016-08-31 ENCOUNTER — Ambulatory Visit (INDEPENDENT_AMBULATORY_CARE_PROVIDER_SITE_OTHER)
Admission: RE | Admit: 2016-08-31 | Discharge: 2016-08-31 | Disposition: A | Discharge status: Home / Self Care | Payer: Medicare Other | Source: Ambulatory Visit | Attending: Pulmonary Disease | Admitting: Pulmonary Disease

## 2016-08-31 VITALS — BP 110/68 | HR 56 | Ht 63.0 in | Wt 126.3 lb

## 2016-08-31 DIAGNOSIS — K219 Gastro-esophageal reflux disease without esophagitis: Secondary | ICD-10-CM

## 2016-08-31 DIAGNOSIS — J209 Acute bronchitis, unspecified: Secondary | ICD-10-CM | POA: Insufficient documentation

## 2016-08-31 DIAGNOSIS — J40 Bronchitis, not specified as acute or chronic: Secondary | ICD-10-CM

## 2016-08-31 MED ORDER — RANITIDINE HCL 150 MG PO TABS: 150.0000 mg | ORAL_TABLET | Freq: Two times a day (BID) | ORAL | 0 refills | Status: DC

## 2016-08-31 MED ORDER — PREDNISONE 20 MG PO TABS: 20.0000 mg | ORAL_TABLET | Freq: Every day | ORAL | 0 refills | Status: DC

## 2016-08-31 NOTE — Assessment & Plan Note (Signed)
Patient with upper respiratory tract infection, likely viral, clinically improved, but not at baseline. Since she had a bad pneumonia in the past requiring surgery, she was being proactive. Denies fevers and chills. Slowly getting better. (-) sick contacts.   Plan : 1. Hold off on abx.  2. CXR to be certain that there is no PNA. 3. Continue Robitussin and or Mucinex as needed for cough. 4. Advised to call if symptoms get worse or if she starts having fevers, chills. At that point, we need to check her for the flu and likely will need antibiotics. 5. Prednisone for upper airway swelling/laryngitis > pred 20 mg/day for 5 days.

## 2016-08-31 NOTE — Patient Instructions (Signed)
It was a pleasure taking care of you today!  You are diagnosed with bronchitis as well as GERD (reflu disease).   We will obtain Chest Xray.   Start Ranitidine (Zantac), 150 mg per tablet, 1 tablet twice a day. Start prednisone, 20 mg per tablet, 1 tablet daily for 5 days.  Please call the office your symptoms are getting worse despite the meds/antibiotics.    Return to clinic as scheduled/ as needed.

## 2016-08-31 NOTE — Assessment & Plan Note (Signed)
Zantac, 150 mg BID for now. Asp precaution. Pt to call back if not better > may try PPI then.

## 2016-08-31 NOTE — Progress Notes (Signed)
Subjective:    Patient ID: Tina Bell, female    DOB: 1935-10-14, 81 y.o.   MRN: [id number]  HPI  Patient is being seen in the office for bronchiectasis. She sees Dr. Lake Bells regularly.   She is here today as an urgent visit for cough. Pt is here for cough, dry, with hoarseness. Cough is some better with Robitussin qid. Coughs in sleep. (-) fevers, chills. (-) sick contacts. (-) grandkids around. Also takes Mucinex and Nyquil. She feels better, but not at baseline. GERD acting up recently.     Review of Systems  Constitutional: Negative.   HENT: Negative.   Eyes: Negative.   Respiratory: Positive for cough.   Cardiovascular: Negative.   Gastrointestinal: Negative.   Endocrine: Negative.   Genitourinary: Negative.   Musculoskeletal: Negative.   Skin: Negative.   Allergic/Immunologic: Negative.   Neurological: Negative.   Hematological: Negative.   Psychiatric/Behavioral: Negative.   All other systems reviewed and are negative.   Past Medical History:  Diagnosis Date  . Arthritis    knees  . Borderline glaucoma    right eye  . Bronchiectasis ([place])    pulmologist-  dr [place] Bells--   (  per note secondary to severe pertussis as child)   stable per LOV note 10-03-2015  . H/O pleural empyema    w/ CAP  June 2016  s/p   VAT's with thoracotomy drainage empyema and decortication right lower lobe  . Hiatal hernia   . History of kidney stones [id number]  . History of kidney stones   . History of pertussis as a child    severe   . History of recurrent UTIs   . PONV (postoperative nausea and vomiting)    and difficulty waking up  . Renal calculus, left   . Thoracic ascending aortic aneurysm (HCC)    4 cm   per last ct 08-06-2015  (consult by dr Lucianne Lei tright 08-13-2015)  . Urgency of urination   . Wears glasses      Family History  Problem Relation Age of Onset  . Cancer Mother     ovarian  . Cancer Sister     breast     Past Surgical History:  Procedure Laterality  Date  . CATARACT EXTRACTION W/ INTRAOCULAR LENS IMPLANT Left March 2017  . CYSTOSCOPY WITH URETEROSCOPY AND STENT PLACEMENT Left 11/07/2015   Procedure: CYSTOSCOPY LEFT URETEROSCOPY WITH LEFT RETROGRADE PYELOGRAM;  Surgeon: Festus Aloe, MD;  Location: [facility];  Service: Urology;  Laterality: Left;  . CYSTOSCOPY/URETEROSCOPY/HOLMIUM LASER/STENT PLACEMENT Left 11/28/2015   Procedure: CYSTOSCOPY LEFT /URETEROSCOPY/HOLMIUM LASER/STENT REPLACEMENT;  Surgeon: Festus Aloe, MD;  Location: [facility];  Service: Urology;  Laterality: Left;  . DILATION AND CURETTAGE OF UTERUS  x3  1980's  . EXTRACORPOREAL SHOCK WAVE LITHOTRIPSY  07-09-2013  . HOLMIUM LASER APPLICATION Left [id number]   Procedure: LEFT STENT PLACEMENT;  Surgeon: Festus Aloe, MD;  Location: [facility];  Service: Urology;  Laterality: Left;  . HOLMIUM LASER APPLICATION Left [id number]   Procedure: HOLMIUM LASER APPLICATION;  Surgeon: Festus Aloe, MD;  Location: [facility];  Service: Urology;  Laterality: Left;  . KNEE ARTHROSCOPY Right 1999  . NASAL SEPTUM SURGERY  1989  . PUBOVAGINAL SLING  1998   bone anchor  . STONE EXTRACTION WITH BASKET Left 11/28/2015   Procedure: STONE EXTRACTION WITH BASKET;  Surgeon: Festus Aloe, MD;  Location[facility];  Service: Urology;  Laterality: Left;  . TOTAL KNEE ARTHROPLASTY  Left 05/12/2015   Procedure: LEFT TOTAL KNEE ARTHROPLASTY;  Surgeon: Gaynelle Arabian, MD;  Location: [facility];  Service: Orthopedics;  Laterality: Left;  . TUBAL LIGATION  1974  . VIDEO ASSISTED THORACOSCOPY (VATS)/EMPYEMA Right 01/06/2015   Procedure: VIDEO ASSISTED THORACOSCOPY (VATS)/EMPYEMA AND DRAINAGE OF EMPYEMA;  Surgeon: Ivin Poot, MD;  Location: [facility];  Service: Thoracic;  Laterality: Right;    Social History   Social History  . Marital status: Married    Spouse name: N/A  . Number of children: N/A  . Years of education: N/A   Occupational History  .  Not on file.   Social History Main Topics  . Smoking status: Former Smoker    Packs/day: 0.25    Years: 5.00    Types: Cigarettes    Quit date: 05/03/1962  . Smokeless tobacco: Never Used     Comment: 1 PPWEEK  . Alcohol use Yes     Comment: occasional   . Drug use: No  . Sexual activity: Not on file   Other Topics Concern  . Not on file   Social History Narrative  . No narrative on file     Allergies  Allergen Reactions  . Codeine Other (See Comments)    hallucination  . Keflex [Cephalexin] Hives    Tolerated Augmentin  . Morphine And Related Hives  . Macrodantin [Nitrofurantoin Macrocrystal] Rash    Do not take     Outpatient Medications Prior to Visit  Medication Sig Dispense Refill  . benzonatate (TESSALON PERLES) 100 MG capsule Take 1 capsule (100 mg total) by mouth 3 (three) times daily as needed for cough. 90 capsule 0  . Calcium Carbonate-Vit D-Min (CALCIUM 1200 PO) Take 1 capsule by mouth daily.    Marland Kitchen doxycycline (VIBRA-TABS) 100 MG tablet Take 1 tablet (100 mg total) by mouth 2 (two) times daily. 20 tablet 0  . timolol (TIMOPTIC) 0.5 % ophthalmic solution 1 drop in the right eye     Facility-Administered Medications Prior to Visit  Medication Dose Route Frequency Provider Last Rate Last Dose  . levofloxacin (LEVAQUIN) IVPB 500 mg  500 mg Intravenous Q24H Festus Aloe, MD   500 mg at 11/28/15 1139   Meds ordered this encounter  Medications  . Multiple Vitamin (MULTIVITAMIN) tablet    Sig: Take 1 tablet by mouth daily.  . ranitidine (ZANTAC) 150 MG tablet    Sig: Take 1 tablet (150 mg total) by mouth 2 (two) times daily.    Dispense:  60 tablet    Refill:  0  . predniSONE (DELTASONE) 20 MG tablet    Sig: Take 1 tablet (20 mg total) by mouth daily with breakfast.    Dispense:  5 tablet    Refill:  0     ..........................................................................................................................................................................................................................................................................................................................................................................................................................................................................................................................................................................................................................................................................................................................................................     Objective:   Physical Exam   Vitals:  Vitals:   08/31/16 1055  BP: 110/68  Pulse: (!) 56  SpO2: 96%  Weight: 126 lb 4.8 oz (57.3 kg)  Height: 5\' 3"  (1.6 m)    Constitutional/General:  Pleasant, well-nourished, well-developed, not in any distress,  Comfortably seating.  Well kempt  Body mass index is 22.37 kg/m. Wt Readings from Last 3 Encounters:  08/31/16 126 lb 4.8 oz (57.3 kg)  06/21/16 123 lb 12.8 oz (56.2 kg)  05/18/16 120 lb (54.4 kg)    HEENT: Pupils equal and reactive to light and accommodation. Anicteric sclerae. Normal nasal mucosa.   No oral  lesions,  mouth clear,  oropharynx clear, no postnasal drip. (-) Oral thrush. No dental caries.  Airway - Mallampati class III  Neck: No masses. Midline trachea. No JVD, (-) LAD. (-) bruits appreciated.  Respiratory/Chest: Grossly normal chest. (-) deformity. (-) Accessory muscle use.  Symmetric expansion. (-) Tenderness on palpation.  Resonant on percussion.  Diminished BS on both lower lung zones. (-) wheezing, crackles, rhonchi (-) egophony  Cardiovascular: Regular rate and  rhythm, heart sounds normal, no murmur or gallops, no peripheral edema  Gastrointestinal:  Normal bowel sounds. Soft,  non-tender. No hepatosplenomegaly.  (-) masses.   Musculoskeletal:  Normal muscle tone. Normal gait.   Extremities: Grossly normal. (-) clubbing, cyanosis.  (-) edema  Skin: (-) rash,lesions seen.   Neurological/Psychiatric : alert, oriented to time, place, person. Normal mood and affect         Assessment & Plan:  Acute bronchitis Patient with upper respiratory tract infection, likely viral, clinically improved, but not at baseline. Since she had a bad pneumonia in the past requiring surgery, she was being proactive. Denies fevers and chills. Slowly getting better. (-) sick contacts.   Plan : 1. Hold off on abx.  2. CXR to be certain that there is no PNA. 3. Continue Robitussin and or Mucinex as needed for cough. 4. Advised to call if symptoms get worse or if she starts having fevers, chills. At that point, we need to check her for the flu and likely will need antibiotics. 5. Prednisone for upper airway swelling/laryngitis > pred 20 mg/day for 5 days.   GERD (gastroesophageal reflux disease) Zantac, 150 mg BID for now. Asp precaution. Pt to call back if not better > may try PPI then.     Return to clinic as needed.   Monica Becton, MD 08/31/2016, 11:30 AM [facility] Pager [phone] After 3 pm or if no answer, call [phone]

## 2016-09-01 ENCOUNTER — Ambulatory Visit: Payer: Medicare Other | Admitting: Cardiothoracic Surgery

## 2016-09-01 ENCOUNTER — Other Ambulatory Visit: Payer: Medicare Other

## 2016-09-14 ENCOUNTER — Ambulatory Visit: Payer: Medicare Other | Admitting: Pulmonary Disease

## 2016-09-14 ENCOUNTER — Encounter: Payer: Self-pay | Admitting: Pulmonary Disease

## 2016-09-14 ENCOUNTER — Ambulatory Visit (INDEPENDENT_AMBULATORY_CARE_PROVIDER_SITE_OTHER): Payer: Medicare Other | Admitting: Pulmonary Disease

## 2016-09-14 VITALS — BP 106/64 | HR 57 | Ht 62.0 in | Wt 126.8 lb

## 2016-09-14 DIAGNOSIS — J471 Bronchiectasis with (acute) exacerbation: Secondary | ICD-10-CM | POA: Insufficient documentation

## 2016-09-14 DIAGNOSIS — K219 Gastro-esophageal reflux disease without esophagitis: Secondary | ICD-10-CM | POA: Diagnosis not present

## 2016-09-14 MED ORDER — DOXYCYCLINE HYCLATE 100 MG PO TABS: 100.0000 mg | ORAL_TABLET | Freq: Two times a day (BID) | ORAL | 0 refills | Status: DC

## 2016-09-14 NOTE — Patient Instructions (Signed)
   Use your flutter valve at least once daily and preferably twice daily. This will help to clear the mucus out of your lungs and keep it out. Blow into it 3 times in a row each time you use it.  Continue using Mucinex or Guaifenesin to help clear your mucus.  Avoid taking your Doxycycline/Antibiotic with any dairy products. Also take it with a full glass of water and remain upright for 1 hour afterward.  Remember that your sputum specimen should be from deep within your lungs. Early morning samples are the best. Do not refrigerate the specimen and drop it off at the lab within 4 hours.  Call our office if you aren't improving in a few days or you are getting worse.  We will see you back in 3 weeks to make sure you're getting better.  TESTS ORDERED: 1. Sputum culture for AFB, Fungus, & Bacteria.

## 2016-09-14 NOTE — Progress Notes (Signed)
Subjective:    Patient ID: Tina Bell, female    DOB: 1936/03/20, 81 y.o.   MRN: [id number]  HPI Patient follows with Dr. Lake Bells for her bronchiectasis. Patient developed a cough approximately 1 month. She reports the cough started abruptly. She feels her cough is progressively worsening. She is producing a "yellowish" mucus amounting to a few tablespoons daily. No sore throat. She denies any sinus congestion or pressure but does note that she is blowing her nose more frequently. Denies any significant dyspnea. She was seen on 1/23 and given Prednisone 20mg  for 5 days and Zantac 150mg  bid. Her last course of antibiotics was in October 2017. She will wake up at nigh coughing at times. Denies any wheezing.   Bronchiectasis:  Currently on nothing for airway clearance. Denies any hemoptysis. Previous sputum culture negative for AFB but does have history of lung nodules suspicious for NTM infection. She does have a flutter valve but doesn't use it.   Review of Systems No fever, chills, or sweats. No chest pain or pressure. No abdominal pain, nausea, or emesis. No myalgias or arthralgias.   Allergies  Allergen Reactions  . Codeine Other (See Comments)    hallucination  . Keflex [Cephalexin] Hives    Tolerated Augmentin  . Morphine And Related Hives  . Macrodantin [Nitrofurantoin Macrocrystal] Rash    Do not take    Current Outpatient Prescriptions on File Prior to Visit  Medication Sig Dispense Refill  . Calcium Carbonate-Vit D-Min (CALCIUM 1200 PO) Take 1 capsule by mouth daily.    . Multiple Vitamin (MULTIVITAMIN) tablet Take 1 tablet by mouth daily.    . ranitidine (ZANTAC) 150 MG tablet Take 1 tablet (150 mg total) by mouth 2 (two) times daily. 60 tablet 0  . timolol (TIMOPTIC) 0.5 % ophthalmic solution 1 drop in the right eye    . benzonatate (TESSALON PERLES) 100 MG capsule Take 1 capsule (100 mg total) by mouth 3 (three) times daily as needed for cough. (Patient not taking:  Reported on 09/14/2016) 90 capsule 0   Current Facility-Administered Medications on File Prior to Visit  Medication Dose Route Frequency Provider Last Rate Last Dose  . levofloxacin (LEVAQUIN) IVPB 500 mg  500 mg Intravenous Q24H Festus Aloe, MD   500 mg at 11/28/15 1139    Past Medical History:  Diagnosis Date  . Arthritis    knees  . Borderline glaucoma    right eye  . Bronchiectasis ([place])    pulmologist-  dr Lake Bells--   (  per note secondary to severe pertussis as child)   stable per LOV note 10-03-2015  . H/O pleural empyema    w/ CAP  June 2016  s/p   VAT's with thoracotomy drainage empyema and decortication right lower lobe  . Hiatal hernia   . History of kidney stones [id number]  . History of kidney stones   . History of pertussis as a child    severe   . History of recurrent UTIs   . PONV (postoperative nausea and vomiting)    and difficulty waking up  . Renal calculus, left   . Thoracic ascending aortic aneurysm (HCC)    4 cm   per last ct 08-06-2015  (consult by dr Lucianne Lei tright 08-13-2015)  . Urgency of urination   . Wears glasses     Past Surgical History:  Procedure Laterality Date  . CATARACT EXTRACTION W/ INTRAOCULAR LENS IMPLANT Left March 2017  . CYSTOSCOPY WITH URETEROSCOPY AND STENT  PLACEMENT Left 11/07/2015   Procedure: CYSTOSCOPY LEFT URETEROSCOPY WITH LEFT RETROGRADE PYELOGRAM;  Surgeon: Festus Aloe, MD;  Location: [facility];  Service: Urology;  Laterality: Left;  . CYSTOSCOPY/URETEROSCOPY/HOLMIUM LASER/STENT PLACEMENT Left 11/28/2015   Procedure: CYSTOSCOPY LEFT /URETEROSCOPY/HOLMIUM LASER/STENT REPLACEMENT;  Surgeon: Festus Aloe, MD;  Location: [facility];  Service: Urology;  Laterality: Left;  . DILATION AND CURETTAGE OF UTERUS  x3  1980's  . EXTRACORPOREAL SHOCK WAVE LITHOTRIPSY  07-09-2013  . HOLMIUM LASER APPLICATION Left [id number]   Procedure: LEFT STENT PLACEMENT;  Surgeon: Festus Aloe, MD;  Location: [facility];  Service:  Urology;  Laterality: Left;  . HOLMIUM LASER APPLICATION Left [id number]   Procedure: HOLMIUM LASER APPLICATION;  Surgeon: Festus Aloe, MD;  Location: [facility];  Service: Urology;  Laterality: Left;  . KNEE ARTHROSCOPY Right 1999  . NASAL SEPTUM SURGERY  1989  . PUBOVAGINAL SLING  1998   bone anchor  . STONE EXTRACTION WITH BASKET Left 11/28/2015   Procedure: STONE EXTRACTION WITH BASKET;  Surgeon: Festus Aloe, MD;  Location: [facility];  Service: Urology;  Laterality: Left;  . TOTAL KNEE ARTHROPLASTY Left 05/12/2015   Procedure: LEFT TOTAL KNEE ARTHROPLASTY;  Surgeon: Gaynelle Arabian, MD;  Location: [facility];  Service: Orthopedics;  Laterality: Left;  . TUBAL LIGATION  1974  . VIDEO ASSISTED THORACOSCOPY (VATS)/EMPYEMA Right 01/06/2015   Procedure: VIDEO ASSISTED THORACOSCOPY (VATS)/EMPYEMA AND DRAINAGE OF EMPYEMA;  Surgeon: Ivin Poot, MD;  Location: [facility];  Service: Thoracic;  Laterality: Right;    Family History  Problem Relation Age of Onset  . Cancer Mother     ovarian  . Cancer Sister     breast    Social History   Social History  . Marital status: Married    Spouse name: N/A  . Number of children: N/A  . Years of education: N/A   Social History Main Topics  . Smoking status: Former Smoker    Packs/day: 0.25    Years: 5.00    Types: Cigarettes    Quit date: 05/03/1962  . Smokeless tobacco: Never Used     Comment: 1 PPWEEK  . Alcohol use Yes     Comment: occasional   . Drug use: No  . Sexual activity: Not Asked   Other Topics Concern  . None   Social History Narrative  . None      Objective:   Physical Exam BP 106/64 (BP Location: Left Arm, Patient Position: Sitting, Cuff Size: Normal)   Pulse (!) 57   Ht 5\' 2"  (1.575 m)   Wt 126 lb 12.8 oz (57.5 kg)   SpO2 95%   BMI 23.19 kg/m  General:  Awake. Alert. No acute distress. Husband with patient today. Integument:  Warm & dry. No rash on exposed skin. No  bruising. Extremities:  No cyanosis or clubbing.  HEENT:  Moist mucus membranes. No oral ulcers. No scleral injection or icterus. Minimal nasal turbinate swelling. Cardiovascular:  Regular rate. No edema. No appreciable JVD.  Pulmonary:  Minimal crackles in the lung bases. Otherwise good aeration bilaterally. Speaking in complete sentences. Abdomen: Soft. Normal bowel sounds. Nondistended.  Musculoskeletal:  Normal bulk and tone. No joint deformity or effusion appreciated.  PFT 01/10/14: FVC 2.37 L (80%) FEV1 1.93 L (97%) FEV1/FVC 0.82 FEF 25-75 1.80 L (114%) negative bronchodilator response TLC 4.51 L (92%) RV 99% ERV 86% DLCO uncorrected 73%  IMAGING CXR PA/LAT 08/31/16 (personally reviewed by me): Chronic opacification right costophrenic  angle suggestive of scar tissue formation when compared with previous imaging. No pleural effusion. No parenchymal mass or opacity appreciated. Heart normal in size & mediastinum normal in contour.  MICROBIOLOGY Sputum Culture (05/13/16):  Oral Flora / AFB negative / Fungus negative  Left Pleural Effusion (01/06/15): Bacteria Negative / AFB Negative / Fungus Negative   LABS 01/05/15 ABG on RA: 7.46 / 38 / 70 / Sat 95%  01/17/14 IgG: 807 IgA: 260 IgM: 232 C3: 103 C4: 34    Assessment & Plan:  81 y.o. Female with known underlying bronchiectasis. Patient has a worsening cough with mucus production. Patient currently is not using her flutter valve for airway clearance and I feel this is most consistent with an ongoing exacerbation of her underlying bronchiectasis. She has no symptoms with regards to her reflux. In the absence of wheezing on physical exam and by report I do not feel that further steroids are necessary at this time. I instructed the patient contact my office if she had any new breathing problems or questions before her next appointment. We discussed her previous chest x-ray and the patient is scheduled for a CT scan of her chest  tomorrow by cardiothoracic surgery.  1. Bronchiectasis with acute exacerbation: Treating with doxycycline 100 mg by mouth twice a day 10 days. Checking sputum culture for AFB, fungus, and bacteria. Holding off on chest imaging pending CT scan scheduled for tomorrow. Encourage the patient to use her flutter valve at least once and preferably twice daily for airway clearance on a chronic basis. 2. GERD: Continuing Zantac by mouth twice a day. 3. Follow-up: Patient to return to clinic in 3 weeks or sooner if needed.  Sonia Baller Ashok Cordia, M.D. [facility] Pager:  [phone] After 3pm or if no response, call [phone] 9:52 AM 09/14/16

## 2016-09-15 ENCOUNTER — Ambulatory Visit: Payer: Medicare Other | Admitting: Cardiothoracic Surgery

## 2016-09-15 ENCOUNTER — Ambulatory Visit
Admission: RE | Admit: 2016-09-15 | Discharge: 2016-09-15 | Disposition: A | Discharge status: Home / Self Care | Payer: Medicare Other | Source: Ambulatory Visit | Attending: Cardiothoracic Surgery | Admitting: Cardiothoracic Surgery

## 2016-09-15 ENCOUNTER — Other Ambulatory Visit: Payer: Self-pay | Admitting: Cardiothoracic Surgery

## 2016-09-15 DIAGNOSIS — R918 Other nonspecific abnormal finding of lung field: Secondary | ICD-10-CM

## 2016-09-15 MED ORDER — IOPAMIDOL (ISOVUE-300) INJECTION 61%
75.0000 mL | Freq: Once | INTRAVENOUS | Status: AC | PRN
  Administered 2016-09-15: 75 mL via INTRAVENOUS

## 2016-09-29 ENCOUNTER — Ambulatory Visit: Payer: Medicare Other | Admitting: Cardiothoracic Surgery

## 2016-10-05 ENCOUNTER — Ambulatory Visit: Payer: Medicare Other | Admitting: Adult Health

## 2016-10-06 ENCOUNTER — Other Ambulatory Visit: Payer: Self-pay | Admitting: *Deleted

## 2016-10-06 ENCOUNTER — Ambulatory Visit (INDEPENDENT_AMBULATORY_CARE_PROVIDER_SITE_OTHER): Payer: Medicare Other | Admitting: Cardiothoracic Surgery

## 2016-10-06 ENCOUNTER — Encounter: Payer: Self-pay | Admitting: Cardiothoracic Surgery

## 2016-10-06 VITALS — BP 160/90 | HR 56 | Resp 20 | Ht 62.0 in | Wt 126.0 lb

## 2016-10-06 DIAGNOSIS — R911 Solitary pulmonary nodule: Secondary | ICD-10-CM | POA: Diagnosis not present

## 2016-10-06 DIAGNOSIS — I712 Thoracic aortic aneurysm, without rupture, unspecified: Secondary | ICD-10-CM

## 2016-10-06 MED ORDER — AZITHROMYCIN 250 MG PO TABS: ORAL_TABLET | ORAL | 0 refills | Status: DC

## 2016-10-06 NOTE — Progress Notes (Signed)
PCP is Horatio Pel, MD Referring Provider is Deland Pretty, MD  Chief Complaint  Patient presents with  . Thoracic Aortic Aneurysm    1 year f/u, review Chest CT from 09/15/2016   . Lung Lesion    HPI: Patient presents for routine 1 year follow-up with CT scan of chest The patient had a right VATS for drainage of empyema in spring 2016 She is followed for a mild-moderate dilatation of the ascending aorta 3.9-4.0 cm. This is asymptomatic and has been stable. She has significant history of COPD from bronchiectasis with recurrent bronchitis. She has not been admitted to the hospital recently but has been on oral antibiotics as an outpatient. She is followed at the pulmonary clinic. Currently she denies problems with cough or shortness of breath.  CT scan of the chest early this month is personally reviewed and counseled with patient. She has inflammatory changes of chronic lung disease with pleural thickening especially the right lung. There is some tree-in-bud changes consistent with inflammation of the right upper lobe. There is a consolidated density in the subpleural medial aspect of the right middle lobe. This is new and measures 14 mm.  The ascending aorta shows no changes in remains at 3.9 cm in diameter-as a dramatic fusiform aneurysm with low risk for dissection and no indication for surgery at this time.  We'll plan on getting a follow-up CT scan in August to follow the new right middle lobe density although this is probably inflammatory.  Past Medical History:  Diagnosis Date  . Arthritis    knees  . Borderline glaucoma    right eye  . Bronchiectasis ([place])    pulmologist-  dr Lake Bells--   (  per note secondary to severe pertussis as child)   stable per LOV note 10-03-2015  . H/O pleural empyema    w/ CAP  June 2016  s/p   VAT's with thoracotomy drainage empyema and decortication right lower lobe  . Hiatal hernia   . History of kidney stones [id number]  . History of  kidney stones   . History of pertussis as a child    severe   . History of recurrent UTIs   . PONV (postoperative nausea and vomiting)    and difficulty waking up  . Renal calculus, left   . Thoracic ascending aortic aneurysm (HCC)    4 cm   per last ct 08-06-2015  (consult by dr Lucianne Lei tright 08-13-2015)  . Urgency of urination   . Wears glasses     Past Surgical History:  Procedure Laterality Date  . CATARACT EXTRACTION W/ INTRAOCULAR LENS IMPLANT Left March 2017  . CYSTOSCOPY WITH URETEROSCOPY AND STENT PLACEMENT Left 11/07/2015   Procedure: CYSTOSCOPY LEFT URETEROSCOPY WITH LEFT RETROGRADE PYELOGRAM;  Surgeon: Festus Aloe, MD;  [facility];  Service: Urology;  Laterality: Left;  . CYSTOSCOPY/URETEROSCOPY/HOLMIUM LASER/STENT PLACEMENT Left 11/28/2015   Procedure: CYSTOSCOPY LEFT /URETEROSCOPY/HOLMIUM LASER/STENT REPLACEMENT;  Surgeon: Festus Aloe, MD;  Location: [facility];  Service: Urology;  Laterality: Left;  . DILATION AND CURETTAGE OF UTERUS  x3  1980's  . EXTRACORPOREAL SHOCK WAVE LITHOTRIPSY  07-09-2013  . HOLMIUM LASER APPLICATION Left [id number]   Procedure: LEFT STENT PLACEMENT;  Surgeon: Festus Aloe, MD;  [facility];  Service: Urology;  Laterality: Left;  . HOLMIUM LASER APPLICATION Left [id number]   Procedure: HOLMIUM LASER APPLICATION;  Surgeon: Festus Aloe, MD;  Location: [facility];  Service: Urology;  Laterality: Left;  . KNEE ARTHROSCOPY  Right 1999  . NASAL SEPTUM SURGERY  1989  . PUBOVAGINAL SLING  1998   bone anchor  . STONE EXTRACTION WITH BASKET Left 11/28/2015   Procedure: STONE EXTRACTION WITH BASKET;  Surgeon: Festus Aloe, MD;  Location: [facility];  Service: Urology;  Laterality: Left;  . TOTAL KNEE ARTHROPLASTY Left 05/12/2015   Procedure: LEFT TOTAL KNEE ARTHROPLASTY;  Surgeon: Gaynelle Arabian, MD;  [facility];  Service: Orthopedics;  Laterality: Left;  . TUBAL LIGATION  1974   . VIDEO ASSISTED THORACOSCOPY (VATS)/EMPYEMA Right 01/06/2015   Procedure: VIDEO ASSISTED THORACOSCOPY (VATS)/EMPYEMA AND DRAINAGE OF EMPYEMA;  Surgeon: Ivin Poot, MD;  Location: [facility];  Service: Thoracic;  Laterality: Right;    Family History  Problem Relation Age of Onset  . Cancer Mother     ovarian  . Cancer Sister     breast    Social History Social History  Substance Use Topics  . Smoking status: Former Smoker    Packs/day: 0.25    Years: 5.00    Types: Cigarettes    Quit date: 05/03/1962  . Smokeless tobacco: Never Used     Comment: 1 PPWEEK  . Alcohol use Yes     Comment: occasional     Current Outpatient Prescriptions  Medication Sig Dispense Refill  . benzonatate (TESSALON PERLES) 100 MG capsule Take 1 capsule (100 mg total) by mouth 3 (three) times daily as needed for cough. 90 capsule 0  . Calcium Carbonate-Vit D-Min (CALCIUM 1200 PO) Take 1 capsule by mouth daily.    Marland Kitchen guaiFENesin (MUCINEX) 600 MG 12 hr tablet Take by mouth 2 (two) times daily as needed.    . Multiple Vitamin (MULTIVITAMIN) tablet Take 1 tablet by mouth daily.    . ranitidine (ZANTAC) 150 MG tablet Take 1 tablet (150 mg total) by mouth 2 (two) times daily. 60 tablet 0  . timolol (TIMOPTIC) 0.5 % ophthalmic solution 1 drop in the right eye     No current facility-administered medications for this visit.    Facility-Administered Medications Ordered in Other Visits  Medication Dose Route Frequency Provider Last Rate Last Dose  . levofloxacin (LEVAQUIN) IVPB 500 mg  500 mg Intravenous Q24H Festus Aloe, MD   500 mg at 11/28/15 1139    Allergies  Allergen Reactions  . Codeine Other (See Comments)    hallucination  . Keflex [Cephalexin] Hives    Tolerated Augmentin  . Morphine And Related Hives  . Macrodantin [Nitrofurantoin Macrocrystal] Rash    Do not take    Review of Systems   No recent admissions for pulmonary infection Currently walking daily Minimal productive cough No  fatigue myalgias or weakness  BP (!) 160/90   Pulse (!) 56   Resp 20   Ht 5\' 2"  (1.575 m)   Wt 126 lb (57.2 kg)   SpO2 97% Comment: RA  BMI 23.05 kg/m  Physical Exam      Exam    General- alert and comfortable   Lungs- clear without rales, wheezes. Well-healed right VATS incision   Cor- regular rate and rhythm, no murmur , gallop   Abdomen- soft, non-tender   Extremities - warm, non-tender, minimal edema   Neuro- oriented, appropriate, no focal weakness   Diagnostic Tests: CT scan reviewed with patient-results as above  Impression: Chronic changes of bronchiectasis and inflammation in the right lung with pleural thickening. New consolidated density of the right middle lobe medially-14 mm Although this is probably inflammatory she'll need  a follow-up CT scan in 6 months which I will arrange Plan: Return with repeat CT scan of chest in 6 months.  Len Childs, MD [facility] [phone]

## 2016-11-24 ENCOUNTER — Other Ambulatory Visit: Payer: Self-pay | Admitting: Internal Medicine

## 2016-11-24 DIAGNOSIS — Z1231 Encounter for screening mammogram for malignant neoplasm of breast: Secondary | ICD-10-CM

## 2017-02-19 ENCOUNTER — Encounter: Payer: Self-pay | Admitting: Cardiothoracic Surgery

## 2017-02-22 ENCOUNTER — Other Ambulatory Visit: Payer: Self-pay | Admitting: Cardiothoracic Surgery

## 2017-02-22 DIAGNOSIS — J471 Bronchiectasis with (acute) exacerbation: Secondary | ICD-10-CM

## 2017-03-30 ENCOUNTER — Encounter: Payer: Self-pay | Admitting: Cardiothoracic Surgery

## 2017-03-30 ENCOUNTER — Ambulatory Visit
Admission: RE | Admit: 2017-03-30 | Discharge: 2017-03-30 | Disposition: A | Discharge status: Home / Self Care | Payer: Medicare Other | Source: Ambulatory Visit | Attending: Cardiothoracic Surgery | Admitting: Cardiothoracic Surgery

## 2017-03-30 ENCOUNTER — Ambulatory Visit (INDEPENDENT_AMBULATORY_CARE_PROVIDER_SITE_OTHER): Payer: Medicare Other | Admitting: Cardiothoracic Surgery

## 2017-03-30 VITALS — BP 160/95 | HR 72 | Resp 20 | Ht 62.0 in | Wt 128.4 lb

## 2017-03-30 DIAGNOSIS — J471 Bronchiectasis with (acute) exacerbation: Secondary | ICD-10-CM

## 2017-03-30 DIAGNOSIS — R911 Solitary pulmonary nodule: Secondary | ICD-10-CM | POA: Diagnosis not present

## 2017-03-30 DIAGNOSIS — I712 Thoracic aortic aneurysm, without rupture, unspecified: Secondary | ICD-10-CM

## 2017-03-30 DIAGNOSIS — J869 Pyothorax without fistula: Secondary | ICD-10-CM | POA: Diagnosis not present

## 2017-03-30 MED ORDER — IOPAMIDOL (ISOVUE-300) INJECTION 61%
75.0000 mL | Freq: Once | INTRAVENOUS | Status: AC | PRN
  Administered 2017-03-30: 75 mL via INTRAVENOUS

## 2017-03-31 ENCOUNTER — Ambulatory Visit: Payer: Medicare Other

## 2017-03-31 ENCOUNTER — Ambulatory Visit
Admission: RE | Admit: 2017-03-31 | Discharge: 2017-03-31 | Disposition: A | Discharge status: Home / Self Care | Payer: Medicare Other | Source: Ambulatory Visit | Attending: Internal Medicine | Admitting: Internal Medicine

## 2017-03-31 DIAGNOSIS — Z1231 Encounter for screening mammogram for malignant neoplasm of breast: Secondary | ICD-10-CM

## 2017-04-01 NOTE — Progress Notes (Signed)
PCP is Deland Pretty, MD Referring Provider is Deland Pretty, MD  Chief Complaint  Patient presents with  . Follow-up    6 month f/u with Chest CT eval pleural thickening of right lung    HPI: She returns with interval CT chest after prior study [ 6 months] demonstrated R nodular densities thought likely to represent infection/inflamation  Today the CT shows resolution of those densities. She still has chronic changes of bronchiectasis  and possible MAC  The thoracic aorta remains at 4 cm- asymptomatic fusiform aneurysm  Past Medical History:  Diagnosis Date  . Arthritis    knees  . Borderline glaucoma    right eye  . Bronchiectasis ([place])    pulmologist-  dr Lake Bells--   (  per note secondary to severe pertussis as child)   stable per LOV note 10-03-2015  . H/O pleural empyema    w/ CAP  June 2016  s/p   VAT's with thoracotomy drainage empyema and decortication right lower lobe  . Hiatal hernia   . History of kidney stones [id number]  . History of kidney stones   . History of pertussis as a child    severe   . History of recurrent UTIs   . PONV (postoperative nausea and vomiting)    and difficulty waking up  . Renal calculus, left   . Thoracic ascending aortic aneurysm (HCC)    4 cm   per last ct 08-06-2015  (consult by dr Lucianne Lei tright 08-13-2015)  . Urgency of urination   . Wears glasses     Past Surgical History:  Procedure Laterality Date  . BREAST EXCISIONAL BIOPSY Right   . CATARACT EXTRACTION W/ INTRAOCULAR LENS IMPLANT Left March 2017  . CYSTOSCOPY WITH URETEROSCOPY AND STENT PLACEMENT Left 11/07/2015   Procedure: CYSTOSCOPY LEFT URETEROSCOPY WITH LEFT RETROGRADE PYELOGRAM;  Surgeon: Festus Aloe, MD;  [facility];  Service: Urology;  Laterality: Left;  . CYSTOSCOPY/URETEROSCOPY/HOLMIUM LASER/STENT PLACEMENT Left 11/28/2015   Procedure: CYSTOSCOPY LEFT /URETEROSCOPY/HOLMIUM LASER/STENT REPLACEMENT;  Surgeon: Festus Aloe, MD;  Location[facility];  Service: Urology;  Laterality: Left;  . DILATION AND CURETTAGE OF UTERUS  x3  1980's  . EXTRACORPOREAL SHOCK WAVE LITHOTRIPSY  07-09-2013  . HOLMIUM LASER APPLICATION Left 7/54/4920   Procedure: LEFT STENT PLACEMENT;  Surgeon: Festus Aloe, MD;  [facility];  Service: Urology;  Laterality: Left;  . HOLMIUM LASER APPLICATION Left 1/00/7121   Procedure: HOLMIUM LASER APPLICATION;  Surgeon: Festus Aloe, MD;  Location: [facility];  Service: Urology;  Laterality: Left;  . KNEE ARTHROSCOPY Right 1999  . NASAL SEPTUM SURGERY  1989  . PUBOVAGINAL SLING  1998   bone anchor  . STONE EXTRACTION WITH BASKET Left 11/28/2015   Procedure: STONE EXTRACTION WITH BASKET;  Surgeon: Festus Aloe, MD;  Location: [facility];  Service: Urology;  Laterality: Left;  . TOTAL KNEE ARTHROPLASTY Left 05/12/2015   Procedure: LEFT TOTAL KNEE ARTHROPLASTY;  Surgeon: Gaynelle Arabian, MD;  [facility];  Service: Orthopedics;  Laterality: Left;  . TUBAL LIGATION  1974  . VIDEO ASSISTED THORACOSCOPY (VATS)/EMPYEMA Right 01/06/2015   Procedure: VIDEO ASSISTED THORACOSCOPY (VATS)/EMPYEMA AND DRAINAGE OF EMPYEMA;  Surgeon: Ivin Poot, MD;  Location: [facility];  Service: Thoracic;  Laterality: Right;    Family History  Problem Relation Age of Onset  . Cancer Mother        ovarian  . Cancer Sister        breast  Social History Social History  Substance Use Topics  . Smoking status: Former Smoker    Packs/day: 0.25    Years: 5.00    Types: Cigarettes    Quit date: 05/03/1962  . Smokeless tobacco: Never Used     Comment: 1 PPWEEK  . Alcohol use Yes     Comment: occasional     Current Outpatient Prescriptions  Medication Sig Dispense Refill  . benzonatate (TESSALON PERLES) 100 MG capsule Take 1 capsule (100 mg total) by mouth 3 (three) times daily as needed for cough. 90 capsule 0  . Calcium Carbonate-Vit D-Min (CALCIUM 1200 PO) Take 1 capsule by  mouth daily.    Marland Kitchen guaiFENesin (MUCINEX) 600 MG 12 hr tablet Take by mouth 2 (two) times daily as needed.    . latanoprost (XALATAN) 0.005 % ophthalmic solution Place 1 drop into both eyes at bedtime.    . Multiple Vitamin (MULTIVITAMIN) tablet Take 1 tablet by mouth daily.     No current facility-administered medications for this visit.    Facility-Administered Medications Ordered in Other Visits  Medication Dose Route Frequency Provider Last Rate Last Dose  . levofloxacin (LEVAQUIN) IVPB 500 mg  500 mg Intravenous Q24H Festus Aloe, MD   500 mg at 11/28/15 1139    Allergies  Allergen Reactions  . Codeine Other (See Comments)    hallucination  . Keflex [Cephalexin] Hives    Tolerated Augmentin  . Morphine And Related Hives  . Macrodantin [Nitrofurantoin Macrocrystal] Rash    Do not take    Review of Systems  Feels well Baseline DOE  BP (!) 160/95   Pulse 72   Resp 20   Ht 5' 2"  (1.575 m)   Wt 128 lb 6.4 oz (58.2 kg)   SpO2 92%   BMI 23.48 kg/m  Physical Exam      Exam    General- alert and comfortable   Lungs- clear without rales, wheezes   Cor- regular rate and rhythm, no murmur , gallop   Abdomen- soft, non-tender   Extremities - warm, non-tender, minimal edema   Neuro- oriented, appropriate, no focal weakness   Diagnostic Tests: CT chest images personally reviewed and counseled with patient  Impression: COPD No evidence of malignancy  Plan: folow aschending 4 cm fusiform aneurysm with CT in a year  Len Childs, MD [facility] [phone]

## 2017-09-06 ENCOUNTER — Other Ambulatory Visit: Payer: Self-pay | Admitting: Obstetrics and Gynecology

## 2017-09-06 DIAGNOSIS — R971 Elevated cancer antigen 125 [CA 125]: Secondary | ICD-10-CM

## 2017-09-06 DIAGNOSIS — Z8041 Family history of malignant neoplasm of ovary: Secondary | ICD-10-CM

## 2017-09-09 ENCOUNTER — Other Ambulatory Visit: Payer: Medicare Other

## 2017-09-12 ENCOUNTER — Ambulatory Visit
Admission: RE | Admit: 2017-09-12 | Discharge: 2017-09-12 | Disposition: A | Discharge status: Home / Self Care | Payer: Medicare Other | Source: Ambulatory Visit | Attending: Obstetrics and Gynecology | Admitting: Obstetrics and Gynecology

## 2017-09-12 ENCOUNTER — Other Ambulatory Visit: Payer: Medicare Other

## 2017-09-12 DIAGNOSIS — Z8041 Family history of malignant neoplasm of ovary: Secondary | ICD-10-CM

## 2017-09-12 DIAGNOSIS — R971 Elevated cancer antigen 125 [CA 125]: Secondary | ICD-10-CM

## 2017-09-12 MED ORDER — IOPAMIDOL (ISOVUE-300) INJECTION 61%
100.0000 mL | Freq: Once | INTRAVENOUS | Status: AC | PRN
  Administered 2017-09-12: 100 mL via INTRAVENOUS

## 2017-12-27 ENCOUNTER — Encounter: Payer: Self-pay | Admitting: Allergy and Immunology

## 2017-12-27 ENCOUNTER — Ambulatory Visit: Payer: Medicare Other | Admitting: Allergy and Immunology

## 2017-12-27 VITALS — BP 140/98 | HR 68 | Resp 20

## 2017-12-27 DIAGNOSIS — L509 Urticaria, unspecified: Secondary | ICD-10-CM

## 2017-12-27 MED ORDER — MONTELUKAST SODIUM 10 MG PO TABS: 10.0000 mg | ORAL_TABLET | Freq: Every day | ORAL | 5 refills | Status: DC

## 2017-12-27 NOTE — Progress Notes (Signed)
Follow-up Note  Referring Provider: Deland Pretty, MD Primary Provider: Deland Pretty, MD Date of Office Visit: 12/27/2017  Subjective:   Tina Bell (DOB: 1935/10/24) is a 82 y.o. female who returns to the [facility] [place] on 12/27/2017 in re-evaluation of the following:  HPI: Tina Bell returns to this clinic in reevaluation of urticaria.  Her last visit to this clinic was 20 January 2016 at which point in time she was doing very well and she was tapered off her medications that were controlling her urticaria.    Apparently over the course of the past year or longer she has redeveloped red raised itchy lesions across her body with a frequency of approximately every 6 weeks that last approximately 2 weeks and resolve without any scar or hyperpigmentation.  She always has some degree of urticaria but it is minimal on a chronic basis only to develop these significant outbreaks on multiple areas of her body every 6 weeks.  Once again there is no associated systemic or constitutional symptoms and no obvious trigger.  Allergies as of 12/27/2017      Reactions   Codeine Other (See Comments)   hallucination   Keflex [cephalexin] Hives   Tolerated Augmentin   Morphine And Related Hives   Macrodantin [nitrofurantoin Macrocrystal] Rash   Do not take      Medication List      CALCIUM 1200 PO Take 1 capsule by mouth daily.   latanoprost 0.005 % ophthalmic solution Commonly known as:  XALATAN Place 1 drop into both eyes at bedtime.   multivitamin tablet Take 1 tablet by mouth daily.       Past Medical History:  Diagnosis Date  . Arthritis    knees  . Borderline glaucoma    right eye  . Bronchiectasis ([place])    pulmologist-  dr [place] Bells--   (  per note secondary to severe pertussis as child)   stable per LOV note 10-03-2015  . H/O pleural empyema    w/ CAP  June 2016  s/p   VAT's with thoracotomy drainage empyema and decortication right lower lobe  . Hiatal hernia   .  History of kidney stones 3235,5732  . History of kidney stones   . History of pertussis as a child    severe   . History of recurrent UTIs   . PONV (postoperative nausea and vomiting)    and difficulty waking up  . Renal calculus, left   . Thoracic ascending aortic aneurysm (HCC)    4 cm   per last ct 08-06-2015  (consult by dr Lucianne Lei tright 08-13-2015)  . Urgency of urination   . Wears glasses     Past Surgical History:  Procedure Laterality Date  . BREAST EXCISIONAL BIOPSY Right   . CATARACT EXTRACTION W/ INTRAOCULAR LENS IMPLANT Left March 2017  . CYSTOSCOPY WITH URETEROSCOPY AND STENT PLACEMENT Left 11/07/2015   Procedure: CYSTOSCOPY LEFT URETEROSCOPY WITH LEFT RETROGRADE PYELOGRAM;  Surgeon: Festus Aloe, MD;  Location: [facility];  Service: Urology;  Laterality: Left;  . CYSTOSCOPY/URETEROSCOPY/HOLMIUM LASER/STENT PLACEMENT Left 11/28/2015   Procedure: CYSTOSCOPY LEFT /URETEROSCOPY/HOLMIUM LASER/STENT REPLACEMENT;  Surgeon: Festus Aloe, MD;  Location[facility];  Service: Urology;  Laterality: Left;  . DILATION AND CURETTAGE OF UTERUS  x3  1980's  . EXTRACORPOREAL SHOCK WAVE LITHOTRIPSY  07-09-2013  . HOLMIUM LASER APPLICATION Left 09/10/5425   Procedure: LEFT STENT PLACEMENT;  Surgeon: Festus Aloe, MD;  Location: [facility];  Service: Urology;  Laterality: Left;  . HOLMIUM LASER APPLICATION Left 2/63/7858   Procedure: HOLMIUM LASER APPLICATION;  Surgeon: Festus Aloe, MD;  Location: [facility];  Service: Urology;  Laterality: Left;  . KNEE ARTHROSCOPY Right 1999  . NASAL SEPTUM SURGERY  1989  . PUBOVAGINAL SLING  1998   bone anchor  . STONE EXTRACTION WITH BASKET Left 11/28/2015   Procedure: STONE EXTRACTION WITH BASKET;  Surgeon: Festus Aloe, MD;  Location[facility];  Service: Urology;  Laterality: Left;  . TOTAL KNEE ARTHROPLASTY Left 05/12/2015   Procedure: LEFT TOTAL KNEE ARTHROPLASTY;  Surgeon: Gaynelle Arabian, MD;   Location: [facility];  Service: Orthopedics;  Laterality: Left;  . TUBAL LIGATION  1974  . VIDEO ASSISTED THORACOSCOPY (VATS)/EMPYEMA Right 01/06/2015   Procedure: VIDEO ASSISTED THORACOSCOPY (VATS)/EMPYEMA AND DRAINAGE OF EMPYEMA;  Surgeon: Ivin Poot, MD;  [facility];  Service: Thoracic;  Laterality: Right;    Review of systems negative except as noted in HPI / PMHx or noted below:  Review of Systems  Constitutional: Negative.   HENT: Negative.   Eyes: Negative.   Respiratory: Negative.   Cardiovascular: Negative.   Gastrointestinal: Negative.   Genitourinary: Negative.   Musculoskeletal: Negative.   Skin: Negative.   Neurological: Negative.   Endo/Heme/Allergies: Negative.   Psychiatric/Behavioral: Negative.      Objective:   Vitals:   12/27/17 0928  BP: (!) 140/98  Pulse: 68  Resp: 20          Physical Exam  HENT:  Head: Normocephalic.  Right Ear: Tympanic membrane, external ear and ear canal normal.  Left Ear: Tympanic membrane, external ear and ear canal normal.  Nose: Nose normal. No mucosal edema or rhinorrhea.  Mouth/Throat: Uvula is midline, oropharynx is clear and moist and mucous membranes are normal. No oropharyngeal exudate.  Eyes: Conjunctivae are normal.  Neck: Trachea normal. No tracheal tenderness present. No tracheal deviation present. No thyromegaly present.  Cardiovascular: Normal rate, regular rhythm, S1 normal, S2 normal and normal heart sounds.  No murmur heard. Pulmonary/Chest: Breath sounds normal. No stridor. No respiratory distress. She has no wheezes. She has no rales.  Musculoskeletal: She exhibits no edema.  Lymphadenopathy:       Head (right side): No tonsillar adenopathy present.       Head (left side): No tonsillar adenopathy present.    She has no cervical adenopathy.  Neurological: She is alert.  Skin: Rash (Erythematous nonindurated patch left forearm approximately 2 cm diameter) noted. She is not diaphoretic. No  erythema. Nails show no clubbing.    Diagnostics: none  Assessment and Plan:   1. Urticaria     1. Every day utilize the following medications:   A. cetirizine 10 mg once a day  B. ranitidine 150 mg once a day  C. montelukast 10 mg once a day  2. Can add Benadryl if needed  3. 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  Hopefully the medical plan noted above will work for her and if so then we will attempt to taper down her medicines looking for the least amount of medication required to control this issue.  At this point am not sure that any additional evaluation looking for the cause of her immunological hyperactivity will be of much value.  She does not appear to have a progressive pattern suggesting a progressive disease giving rise to this issue.  Allena Katz, MD Allergy /  Immunology [facility] [place]

## 2017-12-27 NOTE — Patient Instructions (Signed)
  1. Every day utilize the following medications:   A. cetirizine 10 mg once a day  B. ranitidine 150 mg once a day  C. montelukast 10 mg once a day  2. Can add Benadryl if needed  3. Contact clinic in 2 weeks regarding response to treatment

## 2017-12-28 ENCOUNTER — Encounter: Payer: Self-pay | Admitting: Allergy and Immunology

## 2018-01-09 ENCOUNTER — Telehealth: Payer: Self-pay | Admitting: Allergy and Immunology

## 2018-01-09 NOTE — Telephone Encounter (Signed)
Dr. Kozlow please advise and thank you.  

## 2018-01-09 NOTE — Telephone Encounter (Signed)
Called patient and explained Xolair injections. Appt made for patient to come in tomorrow to start injections.

## 2018-01-09 NOTE — Telephone Encounter (Signed)
Please have her start omalizumab 300 mg every 4 weeks with a sample in the clinic and approval from her insurance company.

## 2018-01-09 NOTE — Telephone Encounter (Signed)
Patient was seen 2 weeks ago and told to call back with update Patient is NOT feeling better Hives are NOT going away There are more now than before and more itching What should she do?? Please call

## 2018-01-10 ENCOUNTER — Ambulatory Visit (INDEPENDENT_AMBULATORY_CARE_PROVIDER_SITE_OTHER): Payer: Medicare Other | Admitting: *Deleted

## 2018-01-10 DIAGNOSIS — L500 Allergic urticaria: Secondary | ICD-10-CM | POA: Diagnosis not present

## 2018-01-10 MED ORDER — OMALIZUMAB 150 MG ~~LOC~~ SOLR
300.0000 mg | SUBCUTANEOUS | Status: DC
  Administered 2018-01-10 – 2018-02-06 (×2): 300 mg via SUBCUTANEOUS

## 2018-01-10 MED ORDER — EPINEPHRINE 0.3 MG/0.3ML IJ SOAJ: INTRAMUSCULAR | 1 refills | Status: DC

## 2018-01-10 NOTE — Progress Notes (Signed)
Immunotherapy   Patient Details  Name: Tina Bell MRN: [id number] Date of Birth: [birth year]  01/10/2018  Oswaldo Done started injections for  Xolair 300 mg  Frequency:Every 28 days Epi-Pen:Prescription for Epi-Pen given Consent signed and patient instructions given. Lot [id number] exp: 3/20 patient waited 60 min without problems appointment made for 4 weeks   Orlene Erm 01/10/2018, 6:33 PM

## 2018-01-30 ENCOUNTER — Other Ambulatory Visit: Payer: Self-pay | Admitting: Obstetrics and Gynecology

## 2018-01-30 DIAGNOSIS — Z1231 Encounter for screening mammogram for malignant neoplasm of breast: Secondary | ICD-10-CM

## 2018-02-06 ENCOUNTER — Ambulatory Visit (INDEPENDENT_AMBULATORY_CARE_PROVIDER_SITE_OTHER): Payer: Medicare Other | Admitting: *Deleted

## 2018-02-06 DIAGNOSIS — L501 Idiopathic urticaria: Secondary | ICD-10-CM

## 2018-02-07 ENCOUNTER — Ambulatory Visit: Payer: Self-pay

## 2018-02-17 ENCOUNTER — Other Ambulatory Visit: Payer: Self-pay | Admitting: *Deleted

## 2018-02-17 DIAGNOSIS — I712 Thoracic aortic aneurysm, without rupture, unspecified: Secondary | ICD-10-CM

## 2018-03-06 ENCOUNTER — Ambulatory Visit (INDEPENDENT_AMBULATORY_CARE_PROVIDER_SITE_OTHER): Payer: Medicare Other

## 2018-03-06 DIAGNOSIS — L500 Allergic urticaria: Secondary | ICD-10-CM

## 2018-03-28 ENCOUNTER — Encounter: Payer: Self-pay | Admitting: Allergy and Immunology

## 2018-03-28 ENCOUNTER — Ambulatory Visit: Payer: Medicare Other | Admitting: Allergy and Immunology

## 2018-03-28 VITALS — BP 116/76 | HR 68 | Resp 16

## 2018-03-28 DIAGNOSIS — L509 Urticaria, unspecified: Secondary | ICD-10-CM

## 2018-03-28 NOTE — Progress Notes (Signed)
Follow-up Note  Referring Provider: Deland Pretty, MD Primary Provider: Deland Pretty, MD Date of Office Visit: 03/28/2018  Subjective:   Tina Bell (DOB: Mar 23, 1936) is a 82 y.o. female who returns to the [facility] [place] on 03/28/2018 in re-evaluation of the following:  HPI: Tina Bell returns to this clinic in reevaluation of her urticaria.  I last saw her in this clinic 27 Dec 2017.  It appears that Tina Bell has found the trigger for her immunological hyperreactivity and skin disorder.  There appeared to be a rather significant infestation of her bedroom with bedbugs.  All of this has been rectified and she has not been having any issues with her skin since this issue has been taking care of with the help of an exterminator and removal of all bedding.  Allergies as of 03/28/2018      Reactions   Codeine Other (See Comments)   hallucination   Keflex [cephalexin] Hives   Tolerated Augmentin   Morphine And Related Hives   Macrodantin [nitrofurantoin Macrocrystal] Rash   Do not take      Medication List      CALCIUM 1200 PO Take 1 capsule by mouth daily.   EPINEPHrine 0.3 mg/0.3 mL Soaj injection Commonly known as:  EPI-PEN Use as directed for severe allergic reaction   latanoprost 0.005 % ophthalmic solution Commonly known as:  XALATAN Place 1 drop into both eyes at bedtime.   montelukast 10 MG tablet Commonly known as:  SINGULAIR Take 1 tablet (10 mg total) by mouth at bedtime.   multivitamin tablet Take 1 tablet by mouth daily.       Past Medical History:  Diagnosis Date  . Arthritis    knees  . Borderline glaucoma    right eye  . Bronchiectasis ([place])    pulmologist-  dr [place]ells--   (  per note secondary to severe pertussis as child)   stable per LOV note 10-03-2015  . H/O pleural empyema    w/ CAP  June 2016  s/p   VAT's with thoracotomy drainage empyema and decortication right lower lobe  . Hiatal hernia   . History of kidney stones  1610,9604  . History of kidney stones   . History of pertussis as a child    severe   . History of recurrent UTIs   . PONV (postoperative nausea and vomiting)    and difficulty waking up  . Renal calculus, left   . Thoracic ascending aortic aneurysm (HCC)    4 cm   per last ct 08-06-2015  (consult by dr Lucianne Lei tright 08-13-2015)  . Urgency of urination   . Wears glasses     Past Surgical History:  Procedure Laterality Date  . BREAST EXCISIONAL BIOPSY Right   . CATARACT EXTRACTION W/ INTRAOCULAR LENS IMPLANT Left March 2017  . CYSTOSCOPY WITH URETEROSCOPY AND STENT PLACEMENT Left 11/07/2015   Procedure: CYSTOSCOPY LEFT URETEROSCOPY WITH LEFT RETROGRADE PYELOGRAM;  Surgeon: Festus Aloe, MD;  [facility];  Service: Urology;  Laterality: Left;  . CYSTOSCOPY/URETEROSCOPY/HOLMIUM LASER/STENT PLACEMENT Left 11/28/2015   Procedure: CYSTOSCOPY LEFT /URETEROSCOPY/HOLMIUM LASER/STENT REPLACEMENT;  Surgeon: Festus Aloe, MD;  [facility];  Service: Urology;  Laterality: Left;  . DILATION AND CURETTAGE OF UTERUS  x3  1980's  . EXTRACORPOREAL SHOCK WAVE LITHOTRIPSY  07-09-2013  . HOLMIUM LASER APPLICATION Left 5/40/9811   Procedure: LEFT STENT PLACEMENT;  Surgeon: Festus Aloe, MD;  [facility];  Service: Urology;  Laterality:  Left;  . HOLMIUM LASER APPLICATION Left 8/34/1962   Procedure: HOLMIUM LASER APPLICATION;  Surgeon: Festus Aloe, MD;  Location: [facility];  Service: Urology;  Laterality: Left;  . KNEE ARTHROSCOPY Right 1999  . NASAL SEPTUM SURGERY  1989  . PUBOVAGINAL SLING  1998   bone anchor  . STONE EXTRACTION WITH BASKET Left 11/28/2015   Procedure: STONE EXTRACTION WITH BASKET;  Surgeon: Festus Aloe, MD;  Location: [facility];  Service: Urology;  Laterality: Left;  . TOTAL KNEE ARTHROPLASTY Left 05/12/2015   Procedure: LEFT TOTAL KNEE ARTHROPLASTY;  Surgeon: Gaynelle Arabian, MD;  [facility];   Service: Orthopedics;  Laterality: Left;  . TUBAL LIGATION  1974  . VIDEO ASSISTED THORACOSCOPY (VATS)/EMPYEMA Right 01/06/2015   Procedure: VIDEO ASSISTED THORACOSCOPY (VATS)/EMPYEMA AND DRAINAGE OF EMPYEMA;  Surgeon: Ivin Poot, MD;  Location: [facility];  Service: Thoracic;  Laterality: Right;    Review of systems negative except as noted in HPI / PMHx or noted below:  Review of Systems  Constitutional: Negative.   HENT: Negative.   Eyes: Negative.   Respiratory: Negative.   Cardiovascular: Negative.   Gastrointestinal: Negative.   Genitourinary: Negative.   Musculoskeletal: Negative.   Skin: Negative.   Neurological: Negative.   Endo/Heme/Allergies: Negative.   Psychiatric/Behavioral: Negative.      Objective:   Vitals:   03/28/18 1456  BP: 116/76  Pulse: 68  Resp: 16          Physical Exam  Skin: No rash (No urticaria) noted.    Diagnostics: none  Assessment and Plan:   1. 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  If she has difficulty in the face of this approach she will contact me for further evaluation and treatment.  Allena Katz, MD Allergy / Immunology [place]

## 2018-03-29 ENCOUNTER — Encounter: Payer: Self-pay | Admitting: Allergy and Immunology

## 2018-04-03 ENCOUNTER — Ambulatory Visit
Admission: RE | Admit: 2018-04-03 | Discharge: 2018-04-03 | Disposition: A | Discharge status: Home / Self Care | Payer: Medicare Other | Source: Ambulatory Visit | Attending: Obstetrics and Gynecology | Admitting: Obstetrics and Gynecology

## 2018-04-03 ENCOUNTER — Ambulatory Visit: Payer: Self-pay

## 2018-04-03 DIAGNOSIS — Z1231 Encounter for screening mammogram for malignant neoplasm of breast: Secondary | ICD-10-CM

## 2018-04-05 ENCOUNTER — Encounter: Payer: Self-pay | Admitting: Cardiothoracic Surgery

## 2018-04-05 ENCOUNTER — Ambulatory Visit: Payer: Medicare Other | Admitting: Cardiothoracic Surgery

## 2018-04-05 ENCOUNTER — Ambulatory Visit
Admission: RE | Admit: 2018-04-05 | Discharge: 2018-04-05 | Disposition: A | Discharge status: Home / Self Care | Payer: Medicare Other | Source: Ambulatory Visit | Attending: Cardiothoracic Surgery | Admitting: Cardiothoracic Surgery

## 2018-04-05 VITALS — BP 148/88 | HR 68 | Resp 20 | Ht 62.0 in | Wt 123.0 lb

## 2018-04-05 DIAGNOSIS — I712 Thoracic aortic aneurysm, without rupture, unspecified: Secondary | ICD-10-CM

## 2018-04-05 MED ORDER — IOPAMIDOL (ISOVUE-300) INJECTION 61%
75.0000 mL | Freq: Once | INTRAVENOUS | Status: AC | PRN
  Administered 2018-04-05: 75 mL via INTRAVENOUS

## 2018-04-05 NOTE — Progress Notes (Signed)
PCP is Deland Pretty, MD Referring Provider is Deland Pretty, MD  Chief Complaint  Patient presents with  . Thoracic Aortic Aneurysm    1 year f/u with Chest CT    HPI: One year follow-up of a 4 cm fusiform ascending aneurysm, stable for at least 3-4 years.  It is asymptomatic.  Patient is status post right VATS for drainage of empyema and decortication of right lower lobe 2016.  She has chronic lung disease and chronic bronchitis.  She is a never smoker.  Currently she denies any symptoms of chest pain or productive cough.  No recent antibiotic requirement.  No shortness of breath.  She is turning 82 in 6 weeks.  Review of her CT scan shows stable fusiform dilatation of the ascending aorta at 4.0 cm.  No mural thickening or ulceration.  Her left lower lobe shows some tree-in-bud inflammatory changes.  She has history of bronchiectasis.  We discussed the recommendation that she avoid Levaquin or other for quinolones because of detrimental effect on the aortic media and risk of further aortic enlargement Past Medical History:  Diagnosis Date  . Arthritis    knees  . Borderline glaucoma    right eye  . Bronchiectasis ([place])    pulmologist-  dr [place]--   (  per note secondary to severe pertussis as child)   stable per LOV note 10-03-2015  . H/O pleural empyema    w/ CAP  June 2016  s/p   VAT's with thoracotomy drainage empyema and decortication right lower lobe  . Hiatal hernia   . History of kidney stones 6195,0932  . History of kidney stones   . History of pertussis as a child    severe   . History of recurrent UTIs   . PONV (postoperative nausea and vomiting)    and difficulty waking up  . Renal calculus, left   . Thoracic ascending aortic aneurysm (HCC)    4 cm   per last ct 08-06-2015  (consult by dr Lucianne Lei tright 08-13-2015)  . Urgency of urination   . Wears glasses     Past Surgical History:  Procedure Laterality Date  . BREAST EXCISIONAL BIOPSY Right   . CATARACT  EXTRACTION W/ INTRAOCULAR LENS IMPLANT Left March 2017  . CYSTOSCOPY WITH URETEROSCOPY AND STENT PLACEMENT Left 11/07/2015   Procedure: CYSTOSCOPY LEFT URETEROSCOPY WITH LEFT RETROGRADE PYELOGRAM;  Surgeon: Festus Aloe, MD;  [facility];  Service: Urology;  Laterality: Left;  . CYSTOSCOPY/URETEROSCOPY/HOLMIUM LASER/STENT PLACEMENT Left 11/28/2015   Procedure: CYSTOSCOPY LEFT /URETEROSCOPY/HOLMIUM LASER/STENT REPLACEMENT;  Surgeon: Festus Aloe, MD;  [facility];  Service: Urology;  Laterality: Left;  . DILATION AND CURETTAGE OF UTERUS  x3  1980's  . EXTRACORPOREAL SHOCK WAVE LITHOTRIPSY  07-09-2013  . HOLMIUM LASER APPLICATION Left 6/71/2458   Procedure: LEFT STENT PLACEMENT;  Surgeon: Festus Aloe, MD;  [facility];  Service: Urology;  Laterality: Left;  . HOLMIUM LASER APPLICATION Left 0/99/8338   Procedure: HOLMIUM LASER APPLICATION;  Surgeon: Festus Aloe, MD;  Location: [facility];  Service: Urology;  Laterality: Left;  . KNEE ARTHROSCOPY Right 1999  . NASAL SEPTUM SURGERY  1989  . PUBOVAGINAL SLING  1998   bone anchor  . STONE EXTRACTION WITH BASKET Left 11/28/2015   Procedure: STONE EXTRACTION WITH BASKET;  Surgeon: Festus Aloe, MD;  Location: [facility];  Service: Urology;  Laterality: Left;  . TOTAL KNEE ARTHROPLASTY Left 05/12/2015   Procedure: LEFT TOTAL KNEE  ARTHROPLASTY;  Surgeon: Gaynelle Arabian, MD;  [facility];  Service: Orthopedics;  Laterality: Left;  . TUBAL LIGATION  1974  . VIDEO ASSISTED THORACOSCOPY (VATS)/EMPYEMA Right 01/06/2015   Procedure: VIDEO ASSISTED THORACOSCOPY (VATS)/EMPYEMA AND DRAINAGE OF EMPYEMA;  Surgeon: Ivin Poot, MD;  Location[facility];  Service: Thoracic;  Laterality: Right;    Family History  Problem Relation Age of Onset  . Cancer Mother        ovarian  . Cancer Sister        breast  . Breast cancer Neg Hx     Social History Social History    Tobacco Use  . Smoking status: Former Smoker    Packs/day: 0.25    Years: 5.00    Pack years: 1.25    Types: Cigarettes    Last attempt to quit: 05/03/1962    Years since quitting: 55.9  . Smokeless tobacco: Never Used  . Tobacco comment: 1 PPWEEK  Substance Use Topics  . Alcohol use: Yes    Comment: occasional   . Drug use: No    Current Outpatient Medications  Medication Sig Dispense Refill  . Calcium Carbonate-Vit D-Min (CALCIUM 1200 PO) Take 1 capsule by mouth daily.    Marland Kitchen latanoprost (XALATAN) 0.005 % ophthalmic solution Place 1 drop into both eyes at bedtime.    . Multiple Vitamin (MULTIVITAMIN) tablet Take 1 tablet by mouth daily.     Current Facility-Administered Medications  Medication Dose Route Frequency Provider Last Rate Last Dose  . omalizumab Arvid Right) injection 300 mg  300 mg Subcutaneous Q28 days Jiles Prows, MD   300 mg at 02/06/18 1345   Facility-Administered Medications Ordered in Other Visits  Medication Dose Route Frequency Provider Last Rate Last Dose  . levofloxacin (LEVAQUIN) IVPB 500 mg  500 mg Intravenous Q24H Festus Aloe, MD   500 mg at 11/28/15 1139    Allergies  Allergen Reactions  . Codeine Other (See Comments)    hallucination  . Keflex [Cephalexin] Hives    Tolerated Augmentin  . Morphine And Related Hives  . Macrodantin [Nitrofurantoin Macrocrystal] Rash    Do not take    Review of Systems  Week stable No fever No headache or vision change No difficulty swallowing No edema Chest pain  BP (!) 148/88   Pulse 68   Resp 20   Ht 5\' 2"  (1.575 m)   Wt 123 lb (55.8 kg)   SpO2 96% Comment: RA  BMI 22.50 kg/m  Physical Exam      Exam    General- alert and comfortable    Neck- no JVD, no cervical adenopathy palpable, no carotid bruit   Lungs- clear without rales, wheezes   Cor- regular rate and rhythm, no murmur , gallop   Abdomen- soft, non-tender   Extremities - warm, non-tender, minimal edema   Neuro- oriented,  appropriate, no focal weakness  Diagnostic Tests: CT scan images personally reviewed and counseled patient Stable fusiform aneurysm, mild-moderate  Impression: Patient is a very low risk for aortic dissection with aortic diameter 4.0 cm.  Risk is less than 2%.  She is not on antihypertensive therapy.  She understands she should be aware of her blood pressure, monitor the blood pressure, and avoid systolic blood pressure greater than 140.  Avoid Levaquin.  Plan: Return for screening CT scan of aorta in 1 year.   Len Childs, MD [facility] [phone]

## 2018-04-07 ENCOUNTER — Other Ambulatory Visit: Payer: Self-pay | Admitting: Physician Assistant

## 2018-07-10 ENCOUNTER — Telehealth: Payer: Self-pay | Admitting: *Deleted

## 2018-07-10 NOTE — Telephone Encounter (Signed)
Called and left the patient a message to call the office back. Need to schedule the patient for a new patient appt  

## 2018-07-23 NOTE — Progress Notes (Addendum)
Consult Note: New Patient First Visit   Consult was requested by Dr. Everlene Farrier for an elevated CA125 with a family history of ovarian cancer   Chief Complaint  Patient presents with  . Elevated CA-125    GYN Oncologic Summary 1. TBD o .  HPI: Ms. Tina Bell  is a very nice 82 y.o.  P2  Her mother was diagnosed at age 60 with ovarian cancer and died from her disease. The patient has been getting intermittent CA125 levels for years. She presents a trend with these written down for me today. Starting in 2017 her levels started to increase and over the past year they have actually been abnormal  05/2016 = 37  (with that lab normal being 38) 06/2017 = 43.5 08/2017 = 19 06/20/2018 = 81.8 07/04/2018 = 92  Dr. Gaetano Net has been monitoring her and an [place] in 08/2017 showed no uterine mass, EM 2.64mm. no adnexal masses, no free fluid. Again in Nov 2019 she had another TVUS and the uterus and adnexal regions were normal and no FF was seen, with EM 0.2cm.  A CTScan abdo/pelvis was done 09/2017 with no significant GYN findings. She had a small left hepatic cyst, 22mm left kidney stone, no enlarged nodes, and diverticulosis.   However, given the rise in CA125 to the recent 80/90's she was referred for a Gynecologic Oncology opinion/recommendations.  She has not had genetic testing.  NOTABLE is a history of childhood pertussis and she is followed by pulmonary (CVTS) medicine for sequelae. She has had VATS for drainage of empyema and decortication of right lower lobe 2016.  She has chronic lung disease and chronic bronchitis. 2017 was marked for treatment of nephrolithiasis.  Her main concern is why the CA125 is increasing in recent year(s). She denies a change in appetite, denies weight loss, denies N/V. Denies bloating and pelvic pain. Her bowels are working normally for her.    Imported EPIC Oncologic History:   No history exists.    Measurement of disease: . TBD  Radiology: No  results found. .  . 03/2018 - [facility] - IMPRESSION:1. Stable to incrementally enlarged fusiform thoracic aorticaneurysm with a maximal diameter of 4.1 cm compared to 4.0 cmpreviously.. New patchy tree-in-bud micro nodularity and ground-glassattenuation airspace opacities in the periphery of the left lowerlobe. In the appropriate clinicalsetting, this could represent anacute infectious/inflammatory process including bronchopneumonia.Alternately, this may represent progression of the patient's chronicindolent atypical infection (presumably MAI).3. Otherwise, stable appearance of multifocal areas of mildcylindrical bronchiectasis, bronchial impactionand tree-in-budmicro nodularity in the periphery of the right upper and middlelobes consistent with a chronic indolent atypical infection such asMAI.4. Incompletely imaged left-sided nephrolithiasis appears grosslyunchanged compared to prior abdominal imaging.  Outpatient Encounter Medications as of 07/24/2018  Medication Sig  . Calcium Carbonate-Vit D-Min (CALCIUM 1200 PO) Take 1 capsule by mouth daily.  Marland Kitchen latanoprost (XALATAN) 0.005 % ophthalmic solution Place 1 drop into both eyes at bedtime.  . Multiple Vitamin (MULTIVITAMIN) tablet Take 1 tablet by mouth daily.   Facility-Administered Encounter Medications as of 07/24/2018  Medication  . levofloxacin (LEVAQUIN) IVPB 500 mg   Allergies  Allergen Reactions  . Levaquin [Levofloxacin]     Patient has history of fusiform thoracic ascending aneurysm 4.0 cm  . Codeine Other (See Comments)    Hallucination,hives   . Keflex [Cephalexin] Hives    Tolerated Augmentin  . Morphine And Related Hives  . Macrodantin [Nitrofurantoin Macrocrystal] Rash    Do not take    Past Medical  History:  Diagnosis Date  . Arthritis    knees  . Borderline glaucoma    right eye  . Bronchiectasis ([place])    pulmologist-  dr Lake Bells--   (  per note secondary to severe pertussis as child)   stable per LOV note 10-03-2015   . H/O pleural empyema    w/ CAP  June 2016  s/p   VAT's with thoracotomy drainage empyema and decortication right lower lobe  . Hiatal hernia   . History of kidney stones 0539,7673  . History of pertussis as a child    severe   . History of recurrent UTIs   . PONV (postoperative nausea and vomiting)    and difficulty waking up  . Thoracic ascending aortic aneurysm ([facility])    4 cm   per last ct 08-06-2015  (consult by dr Lucianne Lei tright 08-13-2015)  . Urgency of urination    Past Surgical History:  Procedure Laterality Date  . BREAST EXCISIONAL BIOPSY Right   . CATARACT EXTRACTION W/ INTRAOCULAR LENS IMPLANT Bilateral 10/2015  . CYSTOSCOPY WITH URETEROSCOPY AND STENT PLACEMENT Left 11/07/2015   Procedure: CYSTOSCOPY LEFT URETEROSCOPY WITH LEFT RETROGRADE PYELOGRAM;  Surgeon: Festus Aloe, MD;  Location: [facility];  Service: Urology;  Laterality: Left;  . CYSTOSCOPY/URETEROSCOPY/HOLMIUM LASER/STENT PLACEMENT Left 11/28/2015   Procedure: CYSTOSCOPY LEFT /URETEROSCOPY/HOLMIUM LASER/STENT REPLACEMENT;  Surgeon: Festus Aloe, MD;  Location[facility];  Service: Urology;  Laterality: Left;  . DILATION AND CURETTAGE OF UTERUS  x3  1980's  . EXTRACORPOREAL SHOCK WAVE LITHOTRIPSY  07-09-2013  . HOLMIUM LASER APPLICATION Left 11/25/3788   Procedure: LEFT STENT PLACEMENT;  Surgeon: Festus Aloe, MD;  Location: [facility];  Service: Urology;  Laterality: Left;  . HOLMIUM LASER APPLICATION Left 2/40/9735   Procedure: HOLMIUM LASER APPLICATION;  Surgeon: Festus Aloe, MD;  Location: [facility];  Service: Urology;  Laterality: Left;  . KNEE ARTHROSCOPY Right 1999  . NASAL SEPTUM SURGERY  1989  . PUBOVAGINAL SLING  1998   bone anchor  . STONE EXTRACTION WITH BASKET Left 11/28/2015   Procedure: STONE EXTRACTION WITH BASKET;  Surgeon: Festus Aloe, MD;  Location: [facility];  Service: Urology;  Laterality: Left;  . TOTAL KNEE ARTHROPLASTY Left  05/12/2015   Procedure: LEFT TOTAL KNEE ARTHROPLASTY;  Surgeon: Gaynelle Arabian, MD;  Location: [facility];  Service: Orthopedics;  Laterality: Left;  . TUBAL LIGATION  1974  . VIDEO ASSISTED THORACOSCOPY (VATS)/EMPYEMA Right 01/06/2015   Procedure: VIDEO ASSISTED THORACOSCOPY (VATS)/EMPYEMA AND DRAINAGE OF EMPYEMA;  Surgeon: Ivin Poot, MD;  Location: [place];  Service: Thoracic;  Laterality: Right;        Past Gynecological History:   GYNECOLOGIC HISTORY:  . No LMP recorded. Patient is postmenopausal. age 35 . Menarche: 82 years old . P 2 . Contraceptive h/o OCP, IUD . HRT prior use  . Last Pap 6/ 2018  Family Hx:  Family History  Problem Relation Age of Onset  . Cancer Mother        ovarian  . Cancer Sister        breast  . Stomach cancer Paternal Uncle   . Stomach cancer Maternal Grandmother   . Breast cancer Neg Hx    Social Hx:  Marland Kitchen Tobacco use: previous, quit 1963 . Alcohol use: 1 / week . Illicit Drug use: none . Illicit IV Drug use: none    Review of Systems: Review of Systems  HENT:   Positive for voice change.  Respiratory: Positive for cough.   Genitourinary: Positive for frequency.   Musculoskeletal: Positive for back pain.  Psychiatric/Behavioral: The patient is nervous/anxious.   All other systems reviewed and are negative.   Vitals:  Vitals:   07/24/18 0833  BP: (!) 151/85  Pulse: 63  Resp: 20  Temp: 97.6 F (36.4 C)  SpO2: 97%   Vitals:   07/24/18 0833  Weight: 123 lb 3.2 oz (55.9 kg)  Height: 5\' 2"  (1.575 m)   Body mass index is 22.53 kg/m.  Physical Exam: General :  Well developed, 82 y.o., female in no apparent distress HEENT:  Normocephalic/atraumatic, symmetric, EOMI, eyelids normal Neck:   Supple, no masses.  Lymphatics:  No cervical/ submandibular/ supraclavicular/ infraclavicular/ inguinal adenopathy Respiratory:  Respirations unlabored, no use of accessory muscles CV:   Deferred Breast:  Deferred Musculoskeletal: No CVA  tenderness, normal muscle strength. Abdomen:   Soft, non-tender and nondistended. No evidence of hernia. No masses. Extremities:  No lymphedema, no erythema, non-tender. Skin:   Normal inspection Neuro/Psych:  No focal motor deficit, no abnormal mental status. Normal gait. Normal affect. Alert and oriented to person, place, and time  Genito Urinary: deferred until next visit   Assessment  Elevated CA125, Family history of ovarian cancer   Plan   1. We reviewed in detail her prior CA125 levels 2. I reviewed her imaging personally in her presence today.  ? The transvaginal ultrasounds are not available for my review, but these had no findings. 3. I suspect the CA125 elevation has to do with her pulmonary process, potentially due to her nephrolithiasis, but more likely the inflammatory response with her pulmonary process o We discussed the nonspecificity of the CA125 test itself 4. I recommend observation at this time o Repeat TVUS and CA125 in May 2020 here at [facility] so I can review o I will have her return in May after these tests and we can do a pelvic at that time (she just had one with Dr. Gaetano Net in November) o CT A/P now to be sure no issues given significant rise in CA125 since Jan when last scan was performed.  5. I encouraged her to consider genetic counseling o It may be she has to pay out of her pocket but the counselor can better inform her of her best option for testing/coverage. o Should she test positive for risk of ovarian cancer we'll need to meet back with her sooner, but should it be negative then followup in May 2020 after TVUS/Ca125 repeat is reasonable. o She agrees to referral for the genetic testing 6. She could visit with integrative medicine for lifestyle changes -- example antiinflammatory diet etc,--  but this is not something I strongly recommend. In addition insurance is unlikely to cover these costs.  Face to face time with patient was 60 minutes. Over  50% of this time was spent on counseling and coordination of care.   Mart Piggs, MD Gynecologic Oncologist 07/24/2018, 10:40 PM    Cc: Everlene Farrier, MD (Referring Ob/Gyn) Deland Pretty, MD  (PCP)

## 2018-07-24 ENCOUNTER — Inpatient Hospital Stay (HOSPITAL_BASED_OUTPATIENT_CLINIC_OR_DEPARTMENT_OTHER): Payer: Medicare Other | Admitting: Obstetrics

## 2018-07-24 ENCOUNTER — Inpatient Hospital Stay: Payer: Medicare Other | Attending: Obstetrics

## 2018-07-24 ENCOUNTER — Encounter: Payer: Self-pay | Admitting: Obstetrics

## 2018-07-24 VITALS — BP 151/85 | HR 63 | Temp 97.6°F | Resp 20 | Ht 62.0 in | Wt 123.2 lb

## 2018-07-24 DIAGNOSIS — R971 Elevated cancer antigen 125 [CA 125]: Secondary | ICD-10-CM | POA: Diagnosis not present

## 2018-07-24 DIAGNOSIS — Z8041 Family history of malignant neoplasm of ovary: Secondary | ICD-10-CM | POA: Diagnosis not present

## 2018-07-24 DIAGNOSIS — R978 Other abnormal tumor markers: Secondary | ICD-10-CM

## 2018-07-24 LAB — BASIC METABOLIC PANEL
Anion gap: 9 (ref 5–15)
BUN: 14 mg/dL (ref 8–23)
CO2: 27 mmol/L (ref 22–32)
Calcium: 9.2 mg/dL (ref 8.9–10.3)
Chloride: 107 mmol/L (ref 98–111)
Creatinine, Ser: 0.8 mg/dL (ref 0.44–1.00)
GFR calc Af Amer: >60 mL/min (ref >60)
GFR calc non Af Amer: >60 mL/min (ref >60)
Glucose, Bld: 85 mg/dL (ref 70–99)
Potassium: 3.6 mmol/L (ref 3.5–5.1)
Sodium: 143 mmol/L (ref 135–145)

## 2018-07-24 NOTE — Patient Instructions (Addendum)
1. We'll refer you to genetic counseling 2. In May we will order an ultrasound and CA125 and then I will see you back to go over those 3. For this month we will obtain a CT Scan and let you know the results 4. Consider integrative medicine counseling for anti-inflammatory diet

## 2018-07-26 ENCOUNTER — Other Ambulatory Visit: Payer: Self-pay | Admitting: Physician Assistant

## 2018-07-31 ENCOUNTER — Ambulatory Visit (HOSPITAL_COMMUNITY): Payer: Medicare Other

## 2018-07-31 ENCOUNTER — Ambulatory Visit (HOSPITAL_COMMUNITY)
Admission: RE | Admit: 2018-07-31 | Discharge: 2018-07-31 | Disposition: A | Discharge status: Home / Self Care | Payer: Medicare Other | Source: Ambulatory Visit | Attending: Obstetrics | Admitting: Obstetrics

## 2018-07-31 DIAGNOSIS — R971 Elevated cancer antigen 125 [CA 125]: Secondary | ICD-10-CM | POA: Diagnosis present

## 2018-07-31 MED ORDER — SODIUM CHLORIDE (PF) 0.9 % IJ SOLN
INTRAMUSCULAR | Status: AC
  Filled 2018-07-31: qty 50

## 2018-07-31 MED ORDER — IOHEXOL 300 MG/ML  SOLN
100.0000 mL | Freq: Once | INTRAMUSCULAR | Status: AC | PRN
  Administered 2018-07-31: 100 mL via INTRAVENOUS

## 2018-08-16 ENCOUNTER — Inpatient Hospital Stay: Payer: Medicare Other

## 2018-08-16 ENCOUNTER — Inpatient Hospital Stay: Payer: Medicare Other | Attending: Obstetrics | Admitting: Genetic Counselor

## 2018-08-16 ENCOUNTER — Encounter: Payer: Self-pay | Admitting: Genetic Counselor

## 2018-08-16 ENCOUNTER — Encounter: Payer: Self-pay | Admitting: Gynecologic Oncology

## 2018-08-16 DIAGNOSIS — Z8041 Family history of malignant neoplasm of ovary: Secondary | ICD-10-CM

## 2018-08-16 DIAGNOSIS — Z803 Family history of malignant neoplasm of breast: Secondary | ICD-10-CM | POA: Insufficient documentation

## 2018-08-16 DIAGNOSIS — Z800 Family history of malignant neoplasm of digestive organs: Secondary | ICD-10-CM

## 2018-08-16 DIAGNOSIS — C439 Malignant melanoma of skin, unspecified: Secondary | ICD-10-CM

## 2018-08-16 DIAGNOSIS — Z1379 Encounter for other screening for genetic and chromosomal anomalies: Secondary | ICD-10-CM

## 2018-08-16 NOTE — Progress Notes (Unsigned)
Preparing for your Surgery  Plan for surgery on September 04, 2018 with Dr. Precious Haws at [facility] will be scheduled for a diagnostic laparoscopy with omental biopsy.   Pre-operative Testing -You will receive a phone call from presurgical testing at [facility] to arrange for a pre-operative testing appointment before your surgery.  This appointment normally occurs one to two weeks before your scheduled surgery.   -Bring your insurance card, copy of an advanced directive if applicable, medication list  -At that visit, you will be asked to sign a consent for a possible blood transfusion in case a transfusion becomes necessary during surgery.  The need for a blood transfusion is rare but having consent is a necessary part of your care.     -You should not be taking blood thinners or aspirin at least ten days prior to surgery unless instructed by your surgeon.  Day Before Surgery at [facility] will be asked to take in a light diet the day before surgery.  Avoid carbonated beverages.  You will be advised to have nothing to eat or drink after midnight the evening before.    Eat a light diet the day before surgery.  Examples including soups, broths, toast, yogurt, mashed potatoes.  Things to avoid include carbonated beverages (fizzy beverages), raw fruits and raw vegetables, or beans.   If your bowels are filled with gas, your surgeon will have difficulty visualizing your pelvic organs which increases your surgical risks.  Your role in recovery Your role is to become active as soon as directed by your doctor, while still giving yourself time to heal.  Rest when you feel tired. You will be asked to do the following in order to speed your recovery:  - Cough and breathe deeply. This helps toclear and expand your lungs and can prevent pneumonia. You may be given a spirometer to practice deep breathing. A staff member will show you how to use the spirometer. - Do  mild physical activity. Walking or moving your legs help your circulation and body functions return to normal. A staff member will help you when you try to walk and will provide you with simple exercises. Do not try to get up or walk alone the first time. - Actively manage your pain. Managing your pain lets you move in comfort. We will ask you to rate your pain on a scale of zero to 10. It is your responsibility to tell your doctor or nurse where and how much you hurt so your pain can be treated.  Special Considerations -If you are diabetic, you may be placed on insulin after surgery to have closer control over your blood sugars to promote healing and recovery.  This does not mean that you will be discharged on insulin.  If applicable, your oral antidiabetics will be resumed when you are tolerating a solid diet.  -Your final pathology results from surgery should be available around one week after surgery and the results will be relayed to you when available.  -FMLA forms can be faxed to [phone] and please allow 5-7 business days for completion.   Blood Transfusion Information WHAT IS A BLOOD TRANSFUSION? A transfusion is the replacement of blood or some of its parts. Blood is made up of multiple cells which provide different functions.  Red blood cells carry oxygen and are used for blood loss replacement.  White blood cells fight against infection.  Platelets control bleeding.  Plasma helps clot blood.  Other blood  products are available for specialized needs, such as hemophilia or other clotting disorders. BEFORE THE TRANSFUSION  Who gives blood for transfusions?   You may be able to donate blood to be used at a later date on yourself (autologous donation).  Relatives can be asked to donate blood. This is generally not any safer than if you have received blood from a stranger. The same precautions are taken to ensure safety when a relative's blood is donated.  Healthy volunteers  who are fully evaluated to make sure their blood is safe. This is blood bank blood. Transfusion therapy is the safest it has ever been in the practice of medicine. Before blood is taken from a donor, a complete history is taken to make sure that person has no history of diseases nor engages in risky social behavior (examples are intravenous drug use or sexual activity with multiple partners). The donor's travel history is screened to minimize risk of transmitting infections, such as malaria. The donated blood is tested for signs of infectious diseases, such as HIV and hepatitis. The blood is then tested to be sure it is compatible with you in order to minimize the chance of a transfusion reaction. If you or a relative donates blood, this is often done in anticipation of surgery and is not appropriate for emergency situations. It takes many days to process the donated blood. RISKS AND COMPLICATIONS Although transfusion therapy is very safe and saves many lives, the main dangers of transfusion include:   Getting an infectious disease.  Developing a transfusion reaction. This is an allergic reaction to something in the blood you were given. Every precaution is taken to prevent this. The decision to have a blood transfusion has been considered carefully by your caregiver before blood is given. Blood is not given unless the benefits outweigh the risks.

## 2018-08-16 NOTE — Progress Notes (Signed)
REFERRING PROVIDER: Isabel Caprice, MD [place], [place] [id number]  PRIMARY PROVIDER:  Deland Pretty, MD  PRIMARY REASON FOR VISIT:  1. Family history of ovarian cancer   2. Family history of breast cancer   3. Family history of stomach cancer   4. Melanoma of skin ([place])      HISTORY OF PRESENT ILLNESS:   Ms. Cornacchia, a 83 y.o. female, was seen for a [place] cancer genetics consultation at the request of Dr. Gerarda Fraction due to a personal and family history of cancer.  Ms. Bettes presents to clinic today to discuss the possibility of a hereditary predisposition to cancer, genetic testing, and to further clarify her future cancer risks, as well as potential cancer risks for family members.   Ms. Shimmin reports being diagnosed with melanoma in her 84's.  She states that she was out in the sun, getting tan and playing tennis without sunscreen.  Over the last few years, her CA 125 levels have been increasing.  She had a recent CT scan that showed stranding, for which she reports that she will have exploratory surgery/biopsy for on September 04, 2018.  She reports that her sister underwent genetic testing about 10-12 years ago and was negative.     CANCER HISTORY:   No history exists.     HORMONAL RISK FACTORS:  Menarche was at age 67.  First live birth at age 52.  OCP use for approximately 4 years.  Ovaries intact: yes.  Hysterectomy: no.  Menopausal status: postmenopausal.  HRT use: 14 years. Colonoscopy: yes; normal. Mammogram within the last year: yes. Number of breast biopsies: 2. Up to date with pelvic exams:  yes. Any excessive radiation exposure in the past:  no  Past Medical History:  Diagnosis Date  . Arthritis    knees  . Borderline glaucoma    right eye  . Bronchiectasis ([place])    pulmologist-  dr [place] Bells--   (  per note secondary to severe pertussis as child)   stable per [facility] note 10-03-2015  . Family history of breast cancer   . Family history of ovarian  cancer   . Family history of stomach cancer   . H/O pleural empyema    w/ CAP  June 2016  s/p   VAT's with thoracotomy drainage empyema and decortication right lower lobe  . Hiatal hernia   . History of kidney stones 6808,8110  . History of pertussis as a child    severe   . History of recurrent UTIs   . PONV (postoperative nausea and vomiting)    and difficulty waking up  . Thoracic ascending aortic aneurysm (HCC)    4 cm   per last ct 08-06-2015  (consult by dr Lucianne Lei tright 08-13-2015)  . Urgency of urination     Past Surgical History:  Procedure Laterality Date  . BREAST EXCISIONAL BIOPSY Right   . CATARACT EXTRACTION W/ INTRAOCULAR LENS IMPLANT Bilateral 10/2015  . CYSTOSCOPY WITH URETEROSCOPY AND STENT PLACEMENT Left 11/07/2015   Procedure: CYSTOSCOPY LEFT URETEROSCOPY WITH LEFT RETROGRADE PYELOGRAM;  Surgeon: Festus Aloe, MD;  Location: [facility];  Service: Urology;  Laterality: Left;  . CYSTOSCOPY/URETEROSCOPY/HOLMIUM LASER/STENT PLACEMENT Left 11/28/2015   Procedure: CYSTOSCOPY LEFT /URETEROSCOPY/HOLMIUM LASER/STENT REPLACEMENT;  Surgeon: Festus Aloe, MD;  Location: [facility];  Service: Urology;  Laterality: Left;  . DILATION AND CURETTAGE OF UTERUS  x3  1980's  . EXTRACORPOREAL SHOCK WAVE LITHOTRIPSY  07-09-2013  . HOLMIUM LASER APPLICATION  Left 11/07/2015   Procedure: LEFT STENT PLACEMENT;  Surgeon: Festus Aloe, MD;  Location: [facility];  Service: Urology;  Laterality: Left;  . HOLMIUM LASER APPLICATION Left 0/51/1021   Procedure: HOLMIUM LASER APPLICATION;  Surgeon: Festus Aloe, MD;  Location: [facility];  Service: Urology;  Laterality: Left;  . KNEE ARTHROSCOPY Right 1999  . NASAL SEPTUM SURGERY  1989  . PUBOVAGINAL SLING  1998   bone anchor  . STONE EXTRACTION WITH BASKET Left 11/28/2015   Procedure: STONE EXTRACTION WITH BASKET;  Surgeon: Festus Aloe, MD;  Location: [facility];  Service: Urology;   Laterality: Left;  . TOTAL KNEE ARTHROPLASTY Left 05/12/2015   Procedure: LEFT TOTAL KNEE ARTHROPLASTY;  Surgeon: Gaynelle Arabian, MD;  Location: [facility];  Service: Orthopedics;  Laterality: Left;  . TUBAL LIGATION  1974  . VIDEO ASSISTED THORACOSCOPY (VATS)/EMPYEMA Right 01/06/2015   Procedure: VIDEO ASSISTED THORACOSCOPY (VATS)/EMPYEMA AND DRAINAGE OF EMPYEMA;  Surgeon: Ivin Poot, MD;  Location: [facility];  Service: Thoracic;  Laterality: Right;    Social History   Socioeconomic History  . Marital status: Married    Spouse name: Not on file  . Number of children: Not on file  . Years of education: Not on file  . Highest education level: Not on file  Occupational History  . Not on file  Social Needs  . Financial resource strain: Not on file  . Food insecurity:    Worry: Not on file    Inability: Not on file  . Transportation needs:    Medical: Not on file    Non-medical: Not on file  Tobacco Use  . Smoking status: Former Smoker    Packs/day: 0.25    Years: 5.00    Pack years: 1.25    Types: Cigarettes    Last attempt to quit: 05/03/1962    Years since quitting: 56.3  . Smokeless tobacco: Never Used  . Tobacco comment: 1 PPWEEK, patient has not smoked in 56years  Substance and Sexual Activity  . Alcohol use: Yes    Comment: occasional   . Drug use: No  . Sexual activity: Not on file  Lifestyle  . Physical activity:    Days per week: Not on file    Minutes per session: Not on file  . Stress: Not on file  Relationships  . Social connections:    Talks on phone: Not on file    Gets together: Not on file    Attends religious service: Not on file    Active member of club or organization: Not on file    Attends meetings of clubs or organizations: Not on file    Relationship status: Not on file  Other Topics Concern  . Not on file  Social History Narrative  . Not on file     FAMILY HISTORY:  We obtained a detailed, 4-generation family history.  Significant diagnoses  are listed below: Family History  Problem Relation Age of Onset  . Cancer Mother 15       ovarian; d. 26  . Cancer Sister        breast  . Schizophrenia Sister   . Stomach cancer Paternal Uncle   . Stomach cancer Maternal Grandmother 67  . Head & neck cancer Father 16       jaw cancer, possibly from cigar smoking  . Heart attack Maternal Grandfather   . Other Sister        neg BRCA testing  .  Breast cancer Neg Hx     The patient has two sons who are cancer free.  She has two sisters and a brother.  One sister had breast cancer around at 41 and also had schizophrenia.  Both parents are deceased.  The patient's mother was diagnosed with ovarian cancer and died at age 76.  She had one brother whom the family was not close with and no information on him is known.  The grandmother had stomach cancer and the grandfather died of a heart attack.  The patient's father died of old age.  He had cancer of his jaw age 40.  He had three brothers and two sisters.  One brother had stomach cancer.  The paternal grandparents are deceased from non cancer related issues.  Ms. Gagner is aware of previous family history of genetic testing for hereditary cancer risks in her sister, which were reportedly negative. Patient's maternal ancestors are of [place] descent, and paternal ancestors are of [place] and [place] descent. There is no reported Ashkenazi Jewish ancestry. There is no known consanguinity.  GENETIC COUNSELING ASSESSMENT: ESTHEFANY HERRIG is a 83 y.o. female with a personal and family history of cancer which is somewhat suggestive of a hereditary cancer syndrome and predisposition to cancer. We, therefore, discussed and recommended the following at today's visit.   DISCUSSION: We discussed that about 5-10% of both breast cancer and melanoma are due to hereditary causes, whereas about 15-20% of ovarian cancer is due to hereditary causes.  The most common cause of these cancers, especially when running  together, would be due to BRCA mutations.  Each of these cancers can be due to other genes as well.  We discussed that her sister was tested greater than 5-6 years ago, when our testing dramatically changed.  She may need updated testing.  We reviewed the characteristics, features and inheritance patterns of hereditary cancer syndromes. We also discussed genetic testing, including the appropriate family members to test, the process of testing, insurance coverage and turn-around-time for results. We discussed the implications of a negative, positive and/or variant of uncertain significant result. We recommended Ms. Reierson pursue genetic testing for the multi-cancer gene panel. The Multi-Gene Panel offered by [facility] includes sequencing and/or deletion duplication testing of the following 84 genes: AIP, ALK, APC, ATM, AXIN2,BAP1,  BARD1, BLM, BMPR1A, BRCA1, BRCA2, BRIP1, CASR, CDC73, CDH1, CDK4, CDKN1B, CDKN1C, CDKN2A (p14ARF), CDKN2A (p16INK4a), CEBPA, CHEK2, CTNNA1, DICER1, DIS3L2, EGFR (c.2369C>T, p.Thr790Met variant only), EPCAM (Deletion/duplication testing only), FH, FLCN, GATA2, GPC3, GREM1 (Promoter region deletion/duplication testing only), HOXB13 (c.251G>A, p.Gly84Glu), HRAS, KIT, MAX, MEN1, MET, MITF (c.952G>A, p.Glu318Lys variant only), MLH1, MSH2, MSH3, MSH6, MUTYH, NBN, NF1, NF2, NTHL1, PALB2, PDGFRA, PHOX2B, PMS2, POLD1, POLE, POT1, PRKAR1A, PTCH1, PTEN, RAD50, RAD51C, RAD51D, RB1, RECQL4, RET, RUNX1, SDHAF2, SDHA (sequence changes only), SDHB, SDHC, SDHD, SMAD4, SMARCA4, SMARCB1, SMARCE1, STK11, SUFU, TERC, TERT, TMEM127, TP53, TSC1, TSC2, VHL, WRN and WT1.    Based on Ms. Jurewicz's personal and family history of cancer and her rising CA 125 levels, she meets medical criteria for genetic testing. Despite that she meets criteria, she may still have an out of pocket cost. We discussed that if her out of pocket cost for testing is over $100, the laboratory will call and confirm whether she wants to  proceed with testing.  If the out of pocket cost of testing is less than $100 she will be billed by the genetic testing laboratory.   PLAN: After considering the risks, benefits, and limitations, Ms.  Viele  provided informed consent to pursue genetic testing and the blood sample was sent to [facility] for analysis of the multi-cancer panel. Results should be available within approximately 2-3 weeks' time, at which point they will be disclosed by telephone to Ms. Fogarty, as will any additional recommendations warranted by these results. Ms. Goelz will receive a summary of her genetic counseling visit and a copy of her results once available. This information will also be available in Epic. We encouraged Ms. Marcos to remain in contact with cancer genetics annually so that we can continuously update the family history and inform her of any changes in cancer genetics and testing that may be of benefit for her family. Ms. Monfils questions were answered to her satisfaction today. Our contact information was provided should additional questions or concerns arise.  Lastly, we encouraged Ms. Ehrhard to remain in contact with cancer genetics annually so that we can continuously update the family history and inform her of any changes in cancer genetics and testing that may be of benefit for this family.   Ms.  Mccolgan questions were answered to her satisfaction today. Our contact information was provided should additional questions or concerns arise. Thank you for the referral and allowing [place] to share in the care of your patient.   Tynika Luddy P. Florene Glen, [place], [facility] Certified Genetic Counselor [email or web address] phone: [phone]  The patient was seen for a total of 50 minutes in face-to-face genetic counseling.  This patient was discussed with Drs. Magrinat, Lindi Adie and/or Burr Medico who agrees with the above.    _______________________________________________________________________ For Office Staff:   Number of people involved in session: 2 Was an Intern/ student involved with case: no

## 2018-08-17 ENCOUNTER — Telehealth: Payer: Self-pay | Admitting: Oncology

## 2018-08-17 NOTE — Telephone Encounter (Signed)
Left a message for patient regarding surgery.  Requested a return call.

## 2018-08-24 ENCOUNTER — Telehealth: Payer: Self-pay | Admitting: *Deleted

## 2018-08-24 NOTE — Telephone Encounter (Signed)
Returned the patient's call and spoke with the husband. Explained the the pre-op office should call her the beginning of next week. But to reach out to [place] this week with any issues

## 2018-08-28 ENCOUNTER — Other Ambulatory Visit: Payer: Self-pay

## 2018-08-28 ENCOUNTER — Encounter (HOSPITAL_BASED_OUTPATIENT_CLINIC_OR_DEPARTMENT_OTHER): Payer: Self-pay | Admitting: *Deleted

## 2018-08-28 NOTE — Progress Notes (Signed)
Tina Bell  08/28/2018   Your procedure is scheduled on: 09-04-2018  Report to [facility] Main  Entrance  Report to admitting at 845 AM   NO SOLID FOOD AFTER MIDNIGHT THE NIGHT PRIOR TO SURGERY. NOTHING BY MOUTH EXCEPT CLEAR LIQUIDS UNTIL 3 HOURS PRIOR TO [place] SURGERY. PLEASE FINISH ENSURE DRINK PER SURGEON ORDER 3 HOURS PRIOR TO SCHEDULED SURGERY TIME WHICH NEEDS TO BE COMPLETED AT 815 am.     CLEAR LIQUID DIET   Foods Allowed                                                                     Foods Excluded  Coffee and tea, regular and decaf                             liquids that you cannot  Plain Jell-O in any flavor                                             see through such as: Fruit ices (not with fruit pulp)                                     milk, soups, orange juice  Iced Popsicles                                    All solid food                                   Cranberry, grape and apple juices Sports drinks like Gatorade Lightly seasoned clear broth or consume(fat free) Sugar, honey syrup  Sample Menu Breakfast                                Lunch                                     Supper Cranberry juice                    Beef broth                            Chicken broth Jell-O                                     Grape juice                           Apple juice Coffee or tea  Jell-O                                      Popsicle                                                Coffee or tea                        Coffee or tea  _____________________________________________________________________ Eat a light diet the day before surgery on 09-03-2018  Examples including soups, broths, toast, yogurt, mashed potatoes.  Things to avoid include carbonated beverages (fizzy beverages), raw fruits and raw vegetables, or beans.   If your bowels are filled with gas, your surgeon will have difficulty visualizing your  pelvic organs which increases your surgical risks.   Call this number if you have problems the morning of surgery [place] AND RINSE YOUR MOUTH OUT, NO CHEWING GUM CANDY OR MINTS.     Take these medicines the morning of surgery with A SIP OF WATER: none                                You may not have any metal on your body including hair pins and              piercings  Do not wear jewelry, make-up, lotions, powders or perfumes, deodorant             Do not wear nail polish.  Do not shave  48 hours prior to surgery.              Men may shave face and neck.   Do not bring valuables to the hospital. [place].  Contacts, dentures or bridgework may not be worn into surgery.  Leave suitcase in the car. After surgery it may be brought to your room.     Patients discharged the day of surgery will not be allowed to drive home. IF YOU ARE HAVING SURGERY AND GOING HOME THE SAME DAY, YOU MUST HAVE AN ADULT TO DRIVE YOU HOME AND BE WITH YOU FOR 24 HOURS. YOU MAY GO HOME BY TAXI OR UBER OR ORTHERWISE, BUT AN ADULT MUST ACCOMPANY YOU HOME AND STAY WITH YOU FOR 24 HOURS.  Name and phone number of your driver: spouse driver  Special Instructions: N/A              Please read over the following fact sheets you were given: _____________________________________________________________________ [facility] - Preparing for Surgery Before surgery, you can play an important role.  Because skin is not sterile, your skin needs to be as free of germs as possible.  You can reduce the number of germs on your skin by washing with CHG (chlorahexidine gluconate) soap before surgery.  CHG is an antiseptic cleaner which kills germs and bonds with the skin to continue killing germs even after washing. Please DO NOT use if you have an allergy to CHG or antibacterial soaps.  If your skin becomes reddened/irritated stop using the CHG  and inform your nurse when you arrive at Short Stay. Do not shave (including legs and underarms) for at least 48 hours prior to the first CHG shower.  You may shave your face/neck. Please follow these instructions carefully:  1.  Shower with CHG Soap the night before surgery and the  morning of Surgery.  2.  If you choose to wash your hair, wash your hair first as usual with your  normal  shampoo.  3.  After you shampoo, rinse your hair and body thoroughly to remove the  shampoo.                           4.  Use CHG as you would any other liquid soap.  You can apply chg directly  to the skin and wash                       Gently with a scrungie or clean washcloth.  5.  Apply the CHG Soap to your body ONLY FROM THE NECK DOWN.   Do not use on face/ open                           Wound or open sores. Avoid contact with eyes, ears mouth and genitals (private parts).                       Wash face,  Genitals (private parts) with your normal soap.             6.  Wash thoroughly, paying special attention to the area where your surgery  will be performed.  7.  Thoroughly rinse your body with warm water from the neck down.  8.  DO NOT shower/wash with your normal soap after using and rinsing off  the CHG Soap.                9.  Pat yourself dry with a clean towel.            10.  Wear clean pajamas.            11.  Place clean sheets on your bed the night of your first shower and do not  sleep with pets. Day of Surgery : Do not apply any lotions/deodorants the morning of surgery.  Please wear clean clothes to the hospital/surgery center.  FAILURE TO FOLLOW THESE INSTRUCTIONS MAY RESULT IN THE CANCELLATION OF YOUR SURGERY PATIENT SIGNATURE_________________________________  NURSE SIGNATURE__________________________________  ________________________________________________________________________

## 2018-08-28 NOTE — Progress Notes (Signed)
Patient ok for same day labs per [facility]

## 2018-08-29 ENCOUNTER — Encounter (HOSPITAL_COMMUNITY)
Admission: RE | Admit: 2018-08-29 | Discharge: 2018-08-29 | Disposition: A | Discharge status: Home / Self Care | Payer: Medicare Other | Source: Ambulatory Visit | Attending: Obstetrics | Admitting: Obstetrics

## 2018-08-29 DIAGNOSIS — Z01812 Encounter for preprocedural laboratory examination: Secondary | ICD-10-CM | POA: Insufficient documentation

## 2018-08-29 LAB — COMPREHENSIVE METABOLIC PANEL
ALT: 16 U/L (ref 0–44)
AST: 31 U/L (ref 15–41)
Albumin: 4.6 g/dL (ref 3.5–5.0)
Alkaline Phosphatase: 80 U/L (ref 38–126)
Anion gap: 9 (ref 5–15)
BUN: 17 mg/dL (ref 8–23)
CO2: 28 mmol/L (ref 22–32)
Calcium: 9.4 mg/dL (ref 8.9–10.3)
Chloride: 105 mmol/L (ref 98–111)
Creatinine, Ser: 0.79 mg/dL (ref 0.44–1.00)
GFR calc Af Amer: >60 mL/min (ref >60)
GFR calc non Af Amer: >60 mL/min (ref >60)
Glucose, Bld: 97 mg/dL (ref 70–99)
Potassium: 3.6 mmol/L (ref 3.5–5.1)
Sodium: 142 mmol/L (ref 135–145)
Total Bilirubin: 0.9 mg/dL (ref 0.3–1.2)
Total Protein: 7.4 g/dL (ref 6.5–8.1)

## 2018-08-29 LAB — URINALYSIS, ROUTINE W REFLEX MICROSCOPIC
Bacteria, UA: NONE SEEN
Bilirubin Urine: NEGATIVE
Glucose, UA: NEGATIVE mg/dL
Hgb urine dipstick: NEGATIVE
Ketones, ur: NEGATIVE mg/dL
Leukocytes, UA: NEGATIVE
Nitrite: NEGATIVE
Protein, ur: 30 mg/dL — AB
Specific Gravity, Urine: 1.025 (ref 1.005–1.030)
pH: 5 (ref 5.0–8.0)

## 2018-08-29 LAB — CBC
HCT: 45.4 % (ref 36.0–46.0)
Hemoglobin: 14.3 g/dL (ref 12.0–15.0)
MCH: 27.5 pg (ref 26.0–34.0)
MCHC: 31.5 g/dL (ref 30.0–36.0)
MCV: 87.3 fL (ref 80.0–100.0)
Platelets: 228 10*3/uL (ref 150–400)
RBC: 5.2 MIL/uL — ABNORMAL HIGH (ref 3.87–5.11)
RDW: 13.7 % (ref 11.5–15.5)
WBC: 6.7 10*3/uL (ref 4.0–10.5)
nRBC: 0 % (ref 0.0–0.2)

## 2018-08-29 NOTE — Progress Notes (Signed)
ekg 07-04-18 dr Thayer Jew pharr on chart

## 2018-08-31 ENCOUNTER — Ambulatory Visit: Payer: Medicare Other | Admitting: Pulmonary Disease

## 2018-09-01 ENCOUNTER — Encounter: Payer: Self-pay | Admitting: Pulmonary Disease

## 2018-09-01 ENCOUNTER — Telehealth: Payer: Self-pay

## 2018-09-01 ENCOUNTER — Ambulatory Visit: Payer: Medicare Other | Admitting: Pulmonary Disease

## 2018-09-01 VITALS — BP 142/80 | HR 54 | Ht 62.0 in | Wt 124.0 lb

## 2018-09-01 DIAGNOSIS — R918 Other nonspecific abnormal finding of lung field: Secondary | ICD-10-CM | POA: Diagnosis not present

## 2018-09-01 DIAGNOSIS — J471 Bronchiectasis with (acute) exacerbation: Secondary | ICD-10-CM | POA: Diagnosis not present

## 2018-09-01 DIAGNOSIS — R971 Elevated cancer antigen 125 [CA 125]: Secondary | ICD-10-CM | POA: Diagnosis not present

## 2018-09-01 MED ORDER — BENZONATATE 200 MG PO CAPS: 200.0000 mg | ORAL_CAPSULE | Freq: Three times a day (TID) | ORAL | 1 refills | Status: DC | PRN

## 2018-09-01 NOTE — Telephone Encounter (Signed)
Tina Bell came by the office ~1215 pm after seeing her pulmonologist  Tina Bell stating that he is not recommending that she have the biopsy of the omentum. He feels that the rise in the CA-125 is from the Bronchiectasis. Reviewed with Tina John, NP, Tina Bell, and Tina Bell. They recommend proceeding with the biopsy to rule out any cancer in her omentum. Tina Bell would like to cancell the 09-04-18 surgery and wait for the results of her genetic testing done on 08-16-18 with Tina Bell at [facility]. Tina Bell instructed to call Tina Bell' office once she has received the results of the genetic testing to discuss whether or not she woul like to proceed with a surgical biopsy. Pt verbalized understanding.   Appointments for  09-04-18 and 09-15-18 cancelled at patient's request.

## 2018-09-01 NOTE — Patient Instructions (Signed)
Bronchiectasis: We will refill your Tessalon per prescription to use as needed for cough In the event of increasing chest congestion, mucus production or fever or chills please let us know right away  Changes on the CT scan of your chest: CT scans that have been performed to look at your aorta over the last several years have actually shown a progression of new tree-in-bud abnormalities which are strongly suggestive of a chronic infection in your lungs.  At this time because you do not have any symptoms we can continue to watch these.  However, if you have unexplained fevers, chills, or a case of bronchitis I need to know right away.  We would want to sample your mucus at that time to test for chronic infection. Of note, this can be the explanation for the rising CA 125 level.  I took the time to call your oncologist today to explain this.  I think you should reach out to their office again to decide whether or not you need surgery next week.  Follow up in 3 months or sooner if needed

## 2018-09-01 NOTE — Progress Notes (Signed)
Subjective:    Patient ID: Tina Bell, female    DOB: [birth year], 83 y.o.   MRN: [id number]  Synopsis: First referred to [facility] in 2015. Found to have bronchiectasis on high-resolution CT in 2015 this is believed to be due to a severe pertussis infection as a child. Never smoked.  In June 2016 she had community acquired pneumonia with an empyema required VATs  HPI  Chief Complaint  Patient presents with  . Follow-up    pt states she is doing well, does note a rarely prod cough worse qhs.     Tina Bell comes back to see me today for the first time in about 2-1/2 or 3 years.  She says that she has been well for the most part.  She is not playing tennis anymore.  She has not had problems of shortness of breath, she does not cough, she does not produce mucus.  She cannot recall any episodes of bronchitis or pneumonia in the last several months.  She was found to have a rising Ca1 25 level which led to a CT scan of her abdomen.  This showed some nonspecific stranding around her left ovary as well as the bronchiolitis changes which are known in her left lung.  She has seen gynecologic and there is plan for an exploratory surgery next week.   Past Medical History:  Diagnosis Date  . Arthritis    knees  . Borderline glaucoma    right eye  . Bronchiectasis ([place])    pulmologist-  dr [place] Bells--   (  per note secondary to severe pertussis as child)   stable per LOV note 10-03-2015  . Family history of breast cancer   . Family history of ovarian cancer   . Family history of stomach cancer   . H/O pleural empyema    w/ CAP  June 2016  s/p   VAT's with thoracotomy drainage empyema and decortication right lower lobe  . Hiatal hernia   . History of kidney stones 0254,2706  . History of pertussis as a child    severe   . History of recurrent UTIs    none lately  . PONV (postoperative nausea and vomiting)    and difficulty waking up  . Thoracic ascending aortic aneurysm (HCC)    4.1  cm   per last ct 04-05-18 lov dr Lucianne Lei tright 04-05-18 epic  . Urgency of urination      Review of Systems  Constitutional: Negative for chills, fatigue and fever.  HENT: Negative for rhinorrhea and sinus pressure.   Respiratory: Negative for cough, chest tightness, shortness of breath and wheezing.   Cardiovascular: Negative for chest pain, palpitations and leg swelling.       Objective:   Physical Exam Vitals:   09/01/18 1034  BP: (!) 142/80  Pulse: (!) 54  SpO2: 96%  Weight: 124 lb (56.2 kg)  Height: 5\' 2"  (1.575 m)   RA  Gen: well appearing HENT: OP clear, TM's clear, neck supple PULM: CTA B, normal percussion CV: RRR, no mgr, trace edema GI: BS+, soft, nontender Derm: no cyanosis or rash Psyche: normal mood and affect   Micro October 2017 sputum culture negative for fungal, AFB, bacterial.  Chest imaging: 12/2013 CT chest high res (entrikin read)> scattered throughout the lungs, particularly in the lingula and RML, there is cylindrical bronchiectasis and bronchiolectasis with nodularity suggestive of MAI, extensive air trapping and atherosclerosis, small hiatal hernia August 2019 CT chest performed to look  at her thoracic aneurysm: Images independently reviewed showing new tree-in-bud abnormalities in the left lower lobe strongly suggestive of MAI, bronchiectasis changes noted. 07/2018 CT abdomen> subtle stranding around ovary, some bronchiolitis left lower lobe  CA 125 level is elevated in 2018 compared to prior  PFT June 2015 pulmonary function test> normal ratio, FEV1 93% predicted, no change with bronchodilator, total lung capacity normal, DLCO slightly low at 73% predicted     Assessment & Plan:   Bronchiectasis with (acute) exacerbation (HCC)  Abnormal CT scan, lung  Elevated CA-125  Discussion: Tina Bell is back to see me today in the setting of bronchiectasis.  Though she has not had any clinical worsening from a radiographic standpoint in the last 2  years she has developed new tree-in-bud abnormalities in the left lower lobe which is strongly suggestive of MAI.  Fortunately she does not have symptoms to go along with this in that she has not had fevers, chills, weight loss or increasing chest symptoms.  That being said, the radiographic abnormalities I think are enough to cause a change in her CA 125 levels.  Increasing CA 125 has been well documented to be associated with nonspecific inflammatory conditions including cystic fibrosis, pneumonia, and pleural effusions.  Plan: Bronchiectasis: We will refill your Tessalon per prescription to use as needed for cough In the event of increasing chest congestion, mucus production or fever or chills please let us know right away  Changes on the CT scan of your chest: CT scans that have been performed to look at your aorta over the last several years have actually shown a progression of new tree-in-bud abnormalities which are strongly suggestive of a chronic infection in your lungs.  At this time because you do not have any symptoms we can continue to watch these.  However, if you have unexplained fevers, chills, or a case of bronchitis I need to know right away.  We would want to sample your mucus at that time to test for chronic infection. Of note, this can be the explanation for the rising CA 125 level.  I took the time to call your oncologist today to explain this.  I think you should reach out to their office again to decide whether or not you need surgery next week.  Follow up in 3 months or sooner if needed  > 50% of this 40 min visit spent face to face   Current Outpatient Medications:  .  alendronate (FOSAMAX) 70 MG tablet, Take 70 mg by mouth once a week. Take with a full glass of water on an empty stomach. On firday, Disp: , Rfl:  .  Calcium Carbonate-Vit D-Min (CALCIUM 1200 PO), Take 1 capsule by mouth daily., Disp: , Rfl:  .  latanoprost (XALATAN) 0.005 % ophthalmic solution, Place 1 drop  into both eyes at bedtime., Disp: , Rfl:  .  Multiple Vitamin (MULTIVITAMIN) tablet, Take 1 tablet by mouth daily., Disp: , Rfl:  .  UNABLE TO FIND, Vitamin d 3 100 iu daily, Disp: , Rfl:  No current facility-administered medications for this visit.   Facility-Administered Medications Ordered in Other Visits:  .  levofloxacin (LEVAQUIN) IVPB 500 mg, 500 mg, Intravenous, Q24H, Festus Aloe, MD, 500 mg at 11/28/15 1139

## 2018-09-04 ENCOUNTER — Ambulatory Visit (HOSPITAL_BASED_OUTPATIENT_CLINIC_OR_DEPARTMENT_OTHER): Admission: RE | Admit: 2018-09-04 | Payer: Medicare Other | Source: Home / Self Care | Admitting: Obstetrics

## 2018-09-04 LAB — TYPE AND SCREEN
ABO/RH(D): A POS
Antibody Screen: NEGATIVE

## 2018-09-04 SURGERY — LAPAROSCOPY, DIAGNOSTIC
Anesthesia: General

## 2018-09-07 ENCOUNTER — Ambulatory Visit: Payer: Self-pay | Admitting: Genetic Counselor

## 2018-09-07 ENCOUNTER — Telehealth: Payer: Self-pay | Admitting: Genetic Counselor

## 2018-09-07 ENCOUNTER — Encounter: Payer: Self-pay | Admitting: Genetic Counselor

## 2018-09-07 DIAGNOSIS — Z1379 Encounter for other screening for genetic and chromosomal anomalies: Secondary | ICD-10-CM | POA: Insufficient documentation

## 2018-09-07 NOTE — Telephone Encounter (Signed)
Revealed negative genetic testing.  Discussed that we do not know why she has melanoma and rising CA 125 levels, or why there is cancer in the family. It could be due to a different gene that we are not testing, or maybe our current technology may not be able to pick something up.  It will be important for her to keep in contact with genetics to keep up with whether additional testing may be needed.  Discussed that there are two VUS but we still consider this a normal test and would not change her medical management.

## 2018-09-07 NOTE — Progress Notes (Signed)
HPI:  Ms. Apgar was previously seen in the [facility] due to a personal and family history of cancer and concerns regarding a hereditary predisposition to cancer. Please refer to our prior cancer genetics clinic note for more information regarding Ms. Gwynn's medical, social and family histories, and our assessment and recommendations, at the time. Ms. Overall recent genetic test results were disclosed to her, as were recommendations warranted by these results. These results and recommendations are discussed in more detail below.  CANCER HISTORY:    Melanoma of skin ([place])   08/16/2018 Initial Diagnosis    Melanoma of skin ([place])    08/28/2018 Genetic Testing    BLM c.819_821del (p.Lys273del) and MLH1 c.2162A>G (p.Tyr721Cys) VUS identified on the multicancer panel.  The Multi-Gene Panel offered by [facility] includes sequencing and/or deletion duplication testing of the following 84 genes: AIP, ALK, APC, ATM, AXIN2,BAP1,  BARD1, BLM, BMPR1A, BRCA1, BRCA2, BRIP1, CASR, CDC73, CDH1, CDK4, CDKN1B, CDKN1C, CDKN2A (p14ARF), CDKN2A (p16INK4a), CEBPA, CHEK2, CTNNA1, DICER1, DIS3L2, EGFR (c.2369C>T, p.Thr790Met variant only), EPCAM (Deletion/duplication testing only), FH, FLCN, GATA2, GPC3, GREM1 (Promoter region deletion/duplication testing only), HOXB13 (c.251G>A, p.Gly84Glu), HRAS, KIT, MAX, MEN1, MET, MITF (c.952G>A, p.Glu318Lys variant only), MLH1, MSH2, MSH3, MSH6, MUTYH, NBN, NF1, NF2, NTHL1, PALB2, PDGFRA, PHOX2B, PMS2, POLD1, POLE, POT1, PRKAR1A, PTCH1, PTEN, RAD50, RAD51C, RAD51D, RB1, RECQL4, RET, RUNX1, SDHAF2, SDHA (sequence changes only), SDHB, SDHC, SDHD, SMAD4, SMARCA4, SMARCB1, SMARCE1, STK11, SUFU, TERC, TERT, TMEM127, TP53, TSC1, TSC2, VHL, WRN and WT1.  The report date is August 28, 2018.      FAMILY HISTORY:  We obtained a detailed, 4-generation family history.  Significant diagnoses are listed below: Family History  Problem Relation Age of Onset  . Cancer Mother 6       ovarian; d. 36  . Cancer Sister        breast  . Schizophrenia Sister   . Stomach cancer Paternal Uncle   . Stomach cancer Maternal Grandmother 66  . Head & neck cancer Father 16       jaw cancer, possibly from cigar smoking  . Heart attack Maternal Grandfather   . Other Sister        neg BRCA testing  . Breast cancer Neg Hx     The patient has two sons who are cancer free.  She has two sisters and a brother.  One sister had breast cancer around at 70 and also had schizophrenia.  Both parents are deceased.  The patient's mother was diagnosed with ovarian cancer and died at age 55.  She had one brother whom the family was not close with and no information on him is known.  The grandmother had stomach cancer and the grandfather died of a heart attack.  The patient's father died of old age.  He had cancer of his jaw age 3.  He had three brothers and two sisters.  One brother had stomach cancer.  The paternal grandparents are deceased from non cancer related issues.  Ms. Salberg is aware of previous family history of genetic testing for hereditary cancer risks in her sister, which were reportedly negative. Patient's maternal ancestors are of [place] descent, and paternal ancestors are of [place] and [place] descent. There is no reported Ashkenazi Jewish ancestry. There is no known consanguinity.   GENETIC TEST RESULTS: Genetic testing reported out on September 07, 2018 through the multi cancer panel found no deleterious mutations. The Multi-Gene Panel offered by [facility] includes sequencing and/or deletion duplication testing of  the following 84 genes: AIP, ALK, APC, ATM, AXIN2,BAP1,  BARD1, BLM, BMPR1A, BRCA1, BRCA2, BRIP1, CASR, CDC73, CDH1, CDK4, CDKN1B, CDKN1C, CDKN2A (p14ARF), CDKN2A (p16INK4a), CEBPA, CHEK2, CTNNA1, DICER1, DIS3L2, EGFR (c.2369C>T, p.Thr790Met variant only), EPCAM (Deletion/duplication testing only), FH, FLCN, GATA2, GPC3, GREM1 (Promoter region deletion/duplication testing  only), HOXB13 (c.251G>A, p.Gly84Glu), HRAS, KIT, MAX, MEN1, MET, MITF (c.952G>A, p.Glu318Lys variant only), MLH1, MSH2, MSH3, MSH6, MUTYH, NBN, NF1, NF2, NTHL1, PALB2, PDGFRA, PHOX2B, PMS2, POLD1, POLE, POT1, PRKAR1A, PTCH1, PTEN, RAD50, RAD51C, RAD51D, RB1, RECQL4, RET, RUNX1, SDHAF2, SDHA (sequence changes only), SDHB, SDHC, SDHD, SMAD4, SMARCA4, SMARCB1, SMARCE1, STK11, SUFU, TERC, TERT, TMEM127, TP53, TSC1, TSC2, VHL, WRN and WT1.   The test report has been scanned into EPIC and is located under the Molecular Pathology section of the Results Review tab.    We discussed with Ms. Arico that since the current genetic testing is not perfect, it is possible there may be a gene mutation in one of these genes that current testing cannot detect, but that chance is small.  We also discussed, that it is possible that another gene that has not yet been discovered, or that we have not yet tested, is responsible for the cancer diagnoses in the family, and it is, therefore, important to remain in touch with cancer genetics in the future so that we can continue to offer Ms. Faust the most up to date genetic testing.   Genetic testing did detect two Variants of Unknown Significance - one in the BLM gene called c.819_821del (p.Lys273del) and a second in the MLH1 gene called c.216A>G (p.Tyr721Cys). At this time, it is unknown if this variant is associated with increased cancer risk or if this is a normal finding, but most variants such as this get reclassified to being inconsequential. It should not be used to make medical management decisions. With time, we suspect the lab will determine the significance of this variant, if any. If we do learn more about it, we will try to contact Ms. Shidler to discuss it further. However, it is important to stay in touch with [place] periodically and keep the address and phone number up to date.     CANCER SCREENING RECOMMENDATIONS: This result is reassuring and indicates that Ms. Brigham  likely does not have an increased risk for a future cancer due to a mutation in one of these genes. This normal test also suggests that Ms. Renfrew's cancer was most likely not due to an inherited predisposition associated with one of these genes.  Most cancers happen by chance and this negative test suggests that her cancer falls into this category.  We, therefore, recommended she continue to follow the cancer management and screening guidelines provided by her oncology and primary healthcare provider.   An individual's cancer risk and medical management are not determined by genetic test results alone. Overall cancer risk assessment incorporates additional factors, including personal medical history, family history, and any available genetic information that may result in a personalized plan for cancer prevention and surveillance.  RECOMMENDATIONS FOR FAMILY MEMBERS:  Women in this family might be at some increased risk of developing cancer, over the general population risk, simply due to the family history of cancer.  We recommended women in this family have a yearly mammogram beginning at age 58, or 51 years younger than the earliest onset of cancer, an annual clinical breast exam, and perform monthly breast self-exams. Women in this family should also have a gynecological exam as recommended by their primary provider.  All family members should have a colonoscopy by age 40.  FOLLOW-UP: Lastly, we discussed with Ms. Engen that cancer genetics is a rapidly advancing field and it is possible that new genetic tests will be appropriate for her and/or her family members in the future. We encouraged her to remain in contact with cancer genetics on an annual basis so we can update her personal and family histories and let her know of advances in cancer genetics that may benefit this family.   Our contact number was provided. Ms. Mccaul questions were answered to her satisfaction, and she knows she is welcome to  call us at anytime with additional questions or concerns.   Roma Kayser, MS, [facility] Certified Genetic Counselor [email or web address]

## 2018-09-14 ENCOUNTER — Telehealth: Payer: Self-pay | Admitting: *Deleted

## 2018-09-14 NOTE — Telephone Encounter (Signed)
Called, spoke with the patient about coming in for an appt. Per Dr. Gerarda Fraction asked the patient to come in and discuss the genetics report and surgery. Per the patient "I don't want to some in Dr. Gerarda Fraction told me that I don't have cancer. I probably have a lung issue, and that is what I have. I will see her in May." Melissa APP and Dr. Gerarda Fraction.

## 2018-09-15 ENCOUNTER — Ambulatory Visit: Payer: Medicare Other | Admitting: Obstetrics

## 2018-09-15 ENCOUNTER — Telehealth: Payer: Self-pay | Admitting: Obstetrics

## 2018-09-15 NOTE — Telephone Encounter (Signed)
Patient called at home by me. We discussed the good news of her genetics being negative for any pathogenic mutation. We discussed the CT scan ordered by [place] now shows changes in the omentum. (Highly subtle and borderline perceptible stranding in the upper omentum for example on images 29 through 36 of series 2, without appreciable nodularity." per radiology). We did discuss the increasing CA125's have correlated with her worsening pulmonary disease and this is likely the cause, however given her family history of ovarian cancer, increasing CA125, and changes in the omentum on imaging I am recommending diagnostic laparaoscopy with possible biopsy. The patient declines the procedure at this time.  I therefore recommended repeat CTScan ~mid-March and return to clinic to discuss the results and review a plan of action. The patient declines return to the office and wants to speak to her PCP about the above. We are available for re-consultation if desired.

## 2018-10-13 ENCOUNTER — Telehealth: Payer: Self-pay | Admitting: *Deleted

## 2018-10-13 NOTE — Telephone Encounter (Signed)
Per Lenna Sciara APP I have cancelled the scan, lab and MD appt in May

## 2018-12-01 ENCOUNTER — Ambulatory Visit: Payer: Medicare Other | Admitting: Pulmonary Disease

## 2018-12-04 ENCOUNTER — Other Ambulatory Visit: Payer: Self-pay | Admitting: Urology

## 2018-12-05 ENCOUNTER — Other Ambulatory Visit: Payer: Self-pay

## 2018-12-05 ENCOUNTER — Encounter (HOSPITAL_COMMUNITY): Payer: Self-pay | Admitting: *Deleted

## 2018-12-05 NOTE — Progress Notes (Signed)
At time of preop call on 12/05/2018 husband stated that in procedure instructions from [facility] there is instruction regarding a laxative .  Instructed husband to follow preop instructions from office of Dr Alinda Money.

## 2018-12-05 NOTE — Progress Notes (Addendum)
SPOKE W/  _patient      SCREENING SYMPTOMS OF COVID 19:   COUGH--no  RUNNY NOSE--- no  SORE THROAT---no  NASAL CONGESTION----no  SNEEZING----no  SHORTNESS OF BREATH---no  DIFFICULTY BREATHING---no  TEMP >100.0 -----no  UNEXPLAINED BODY ACHES------no  CHILLS -------- no  HEADACHES ---------no  LOSS OF SMELL/ TASTE --------no    HAVE YOU OR ANY FAMILY MEMBER TRAVELLED PAST 14 DAYS OUT OF THE   COUNTY---no STATE----no COUNTRY----no  HAVE YOU OR ANY FAMILY MEMBER BEEN EXPOSED TO ANYONE WITH COVID 19? No Preprocedure phone call completed. Patient has issues with dementia per husband at times .  Informed patient and husband that he could come back with her to Short Stay but would not be able to go on litho truck and then could return once procedure over to go over discharge instructions.  Patient aware to wear comforatable clothes on day of procedure.  2 pieces with no belt.  Patient aware of no alcohol use on 12/06/2018.  No Aspirin or ASA products within 72 hours and no NSAID use within 48 hours.  Patient to leave hearing aids at home because patient and husband state she can hear without them.  Patient aware to bring blue folder with completed information on 12/07/18.  Patient reports that 2 weeks ago she was taking some Mucinex for a cold then  Week  later developed kidney stone and no more cold symptoms since .  Patient states she had a cough at bedtime which she took some Tessalon for once at nite 2 weeks ago.

## 2018-12-06 ENCOUNTER — Other Ambulatory Visit: Payer: Self-pay | Admitting: Urology

## 2018-12-06 NOTE — H&P (Signed)
Office Visit Report     12/01/2018   --------------------------------------------------------------------------------   Dorinda Hill. Seiple  MRN: [id number]  PRIMARY CARE:  Deland Pretty, MD  DOB: [birth year], 83 year old Female  REFERRING:  Deland Pretty, MD  SSN: -[id number]  PROVIDER:  Festus Aloe, M.D.    TREATING:  Salley Slaughter, [place]:  [facility] [phone]   --------------------------------------------------------------------------------   CC: I have pain in the kidney.  HPI: Shakemia Madera is a 83 year-old female established patient who is here for renal colic.  This patient c/o intermittent episodes of left renal colic x 2 days, and she is currently asymptomatic. She denies dysuria, gross hematuria, or painful urination. She denies fever, chills, nausea, or vomiting.   The problem is on the left side. Her pain started about approximately 11/29/2018. The pain is dull. The pain is intermittent. The pain does radiate.     ALLERGIES: Keflex CAPS Morphine Sulfate (PF) SOLN Nitrofurantoin CAPS    MEDICATIONS: Calcium + D  Eye Drops  Multivitamin     GU PSH: Cystoscopy Insert Stent - 11/25/15 Cystoscopy Ureteroscopy - 25-Nov-2015 ESWL - November 24, 2012 Ureteroscopic laser litho - 11/25/15      [place] Notes: Cystoscopy Ureteroscopy Lithotripsy Incl Insert Indwelling Ureter Stent, Cystoscopy With Insertion Of Ureteral Stent Left, Cystoscopy With Ureteroscopy Left, Lithotripsy   NON-GU PSH: None   GU PMH: Renal calculus - 07/12/2016, 11-25-2015, Calculus of kidney, - November 25, 2015 Urinary Tract Inf, Unspec site, Pyuria - 11-25-14, UTI (lower urinary tract infection), - November 24, 2012 Low back pain, Lower back pain - 11/24/12 Urinary Frequency, Increased urinary frequency - 24-Nov-2012    NON-GU PMH: Encounter for general adult medical examination without abnormal findings, Encounter for preventive health examination - Nov 25, 2015 Bacteriuria, Bacteriuria, asymptomatic - Nov 25, 2014 Personal history of other diseases of the  musculoskeletal system and connective tissue, History of arthritis - 2012/11/24    FAMILY HISTORY: Death - Runs In Family Kidney Stones - Runs In Family malignant neoplasm of breast - Runs In Family Ovarian ca - Runs In Family   SOCIAL HISTORY: Marital Status: Married Preferred Language: English; Race: White Current Smoking Status: Patient does not smoke anymore.  Does not drink caffeine.     Notes: Number of children, Former smoker, Alcohol use, Retired, Married   REVIEW OF SYSTEMS:    GU Review Female:   Patient denies frequent urination, hard to postpone urination, burning /pain with urination, leakage of urine, stream starts and stops, trouble starting your stream, have to strain to urinate, and being pregnant.  Gastrointestinal (Upper):   Patient denies nausea, vomiting, and indigestion/ heartburn.  Gastrointestinal (Lower):   Patient denies diarrhea and constipation.  Constitutional:   Patient denies fever, night sweats, weight loss, and fatigue.  Skin:   Patient denies skin rash/ lesion and itching.  Eyes:   Patient denies blurred vision and double vision.  Ears/ Nose/ Throat:   Patient denies sore throat and sinus problems.  Hematologic/Lymphatic:   Patient denies swollen glands and easy bruising.  Cardiovascular:   Patient denies leg swelling and chest pains.  Respiratory:   Patient denies cough and shortness of breath.  Endocrine:   Patient denies excessive thirst.  Musculoskeletal:   Patient denies back pain and joint pain.  Neurological:   Patient denies headaches and dizziness.  Psychologic:   Patient denies depression and anxiety.   VITAL SIGNS:      12/01/2018 09:10 AM  Weight 120 lb / 54.43 kg  Height 63 in / 160.02 cm  BP 161/97 mmHg  Pulse 79 /min  Temperature 97.3 F / 36.2 C  BMI 21.3 kg/m - BMI Counseling was provided.   MULTI-SYSTEM PHYSICAL EXAMINATION:    Constitutional: Well-nourished. No physical deformities. Normally developed. Good grooming.  Neck: Neck  symmetrical, not swollen. Normal tracheal position.  Respiratory: No labored breathing, no use of accessory muscles.   Neurologic / Psychiatric: Oriented to time, oriented to place, oriented to person. No depression, no anxiety, no agitation.  Gastrointestinal: No mass, no tenderness, no rigidity, non obese abdomen. No CVAT.  Musculoskeletal: Normal gait and station of head and neck.     PAST DATA REVIEWED:  Source Of History:  Patient  X-Ray Review: C.T. Abdomen/Pelvis: Reviewed Films. Reviewed Report. Discussed With Patient. 1.1 cm left renal calculus and an adjacent 7 mm calculus along the lower pole collecting system.    PROCEDURES:         Renal Ultrasound (Limited) - 10626  LT Kidney: Length: 10.6 cm Depth: 5.0 cm Cortical Width: 1.2 cm Width: 5.0 cm    Left Kidney/Ureter:  ? Multiple stones, largest two MP pelvis 1) 1.2cm 2) 0.72cm, Mild hydro noted.  Bladder:  PVR= 12.75ML                KUB - 74018  A single view of the abdomen is obtained. Two obvious, large, left renal calculi.      Patient confirmed No Neulasta OnPro Device.           Urinalysis w/Scope Dipstick Dipstick Cont'd Micro  Color: Yellow Bilirubin: Neg mg/dL WBC/hpf: 40 - 60/hpf  Appearance: Cloudy Ketones: Neg mg/dL RBC/hpf: 3 - 10/hpf  Specific Gravity: 1.025 Blood: 3+ ery/uL Bacteria: Few (10-25/hpf)  pH: 5.5 Protein: 2+ mg/dL Cystals: NS (Not Seen)  Glucose: Neg mg/dL Urobilinogen: 0.2 mg/dL Casts: NS (Not Seen)    Nitrites: Neg Trichomonas: Not Present    Leukocyte Esterase: 3+ leu/uL Mucous: Not Present      Epithelial Cells: NS (Not Seen)      Yeast: NS (Not Seen)      Sperm: Not Present    Notes: unspun microscopic performed    ASSESSMENT:      ICD-10 Details  1 GU:   Renal colic - R48   2   Renal calculus - N20.0    PLAN:            Medications Stop Meds: Timolol Maleate  Discontinue: 12/01/2018  - Reason: The medication cycle was completed.            Orders Labs BUN,  Creatinine and GFR, Urine Culture  X-Rays: KUB    Renal Ultrasound (Limited) - Left Kidney          Schedule Return Visit/Planned Activity: 3 Weeks - Office Visit, Extender, KUB             Note: F/U Megan; KUB prior to [place] Letter(s):  Created for Patient: Clinical Summary         Notes:   This patient has been experiencing left renal colic x2 days, and she is currently asymptomatic and stable. KUB indicates 2 large, left renal calculi. Renal sono indicates mild left-sided hydronephrosis. Urinalysis is abnormal and does exhibit microscopic hematuria. I will order a urine culture and labs to assess her kidney function today. Will plan for ESWL next week if approved by her urologist. I do not think Flomax  will be beneficial, and she already has pain medication s/p knee surgery at home, which she does not plan on taking. She was given strict instructions to notify the office if she experiences including but not limited to: intolerable pain, fever, chills, nausea, or vomiting.   CC: Dr. Deland Pretty        Next Appointment:      Next Appointment: 12/21/2018 09:45 AM    Appointment Type: KUB    Location: [facility] [phone]    Provider: KUB KUB      * Signed by Salley Slaughter, P.A. on 12/01/18 at 11:45 AM (EDT)*       APPENDED NOTES:  Urine cns indicates infection with Enterococcus. Patient informed. She reports previously tolerating Amoxicillin well. I prescribed Amoxicillin 500 mg b.i.d x 1 week. She was instructed to d/c the medication if she does not tolerate it well with special attention to difficulty breathing or compromised airway.     * Signed by Salley Slaughter, P.A. on 12/05/18 at 11:37 AM (EDT)*

## 2018-12-07 ENCOUNTER — Encounter (HOSPITAL_COMMUNITY): Payer: Self-pay | Admitting: Certified Registered"

## 2018-12-07 ENCOUNTER — Ambulatory Visit (HOSPITAL_COMMUNITY): Payer: Medicare Other

## 2018-12-07 ENCOUNTER — Ambulatory Visit (HOSPITAL_COMMUNITY)
Admission: RE | Admit: 2018-12-07 | Discharge: 2018-12-07 | Disposition: A | Discharge status: Home / Self Care | Payer: Medicare Other | Attending: Urology | Admitting: Urology

## 2018-12-07 ENCOUNTER — Encounter (HOSPITAL_COMMUNITY): Payer: Self-pay | Admitting: General Practice

## 2018-12-07 ENCOUNTER — Encounter (HOSPITAL_COMMUNITY)
Admission: RE | Disposition: A | Discharge status: Home / Self Care | Payer: Self-pay | Source: Home / Self Care | Attending: Urology

## 2018-12-07 DIAGNOSIS — Z885 Allergy status to narcotic agent status: Secondary | ICD-10-CM | POA: Diagnosis not present

## 2018-12-07 DIAGNOSIS — M1990 Unspecified osteoarthritis, unspecified site: Secondary | ICD-10-CM | POA: Insufficient documentation

## 2018-12-07 DIAGNOSIS — N200 Calculus of kidney: Secondary | ICD-10-CM | POA: Diagnosis present

## 2018-12-07 DIAGNOSIS — Z87891 Personal history of nicotine dependence: Secondary | ICD-10-CM | POA: Diagnosis not present

## 2018-12-07 DIAGNOSIS — Z803 Family history of malignant neoplasm of breast: Secondary | ICD-10-CM | POA: Insufficient documentation

## 2018-12-07 DIAGNOSIS — Z841 Family history of disorders of kidney and ureter: Secondary | ICD-10-CM | POA: Insufficient documentation

## 2018-12-07 DIAGNOSIS — Z8041 Family history of malignant neoplasm of ovary: Secondary | ICD-10-CM | POA: Insufficient documentation

## 2018-12-07 DIAGNOSIS — N132 Hydronephrosis with renal and ureteral calculous obstruction: Secondary | ICD-10-CM | POA: Insufficient documentation

## 2018-12-07 DIAGNOSIS — Z881 Allergy status to other antibiotic agents status: Secondary | ICD-10-CM | POA: Insufficient documentation

## 2018-12-07 HISTORY — PX: EXTRACORPOREAL SHOCK WAVE LITHOTRIPSY: SHX1557

## 2018-12-07 HISTORY — DX: Unspecified dementia, unspecified severity, without behavioral disturbance, psychotic disturbance, mood disturbance, and anxiety: F03.90

## 2018-12-07 SURGERY — LITHOTRIPSY, ESWL
Anesthesia: LOCAL | Laterality: Left

## 2018-12-07 MED ORDER — SODIUM CHLORIDE 0.9 % IV SOLN
INTRAVENOUS | Status: DC
  Administered 2018-12-07: 10:00:00 via INTRAVENOUS

## 2018-12-07 MED ORDER — DIPHENHYDRAMINE HCL 25 MG PO CAPS
25.0000 mg | ORAL_CAPSULE | ORAL | Status: AC
  Administered 2018-12-07: 10:00:00 25 mg via ORAL
  Filled 2018-12-07: qty 1

## 2018-12-07 MED ORDER — HYDROCODONE-ACETAMINOPHEN 5-325 MG PO TABS: 1.0000 | ORAL_TABLET | Freq: Four times a day (QID) | ORAL | 0 refills | Status: DC | PRN

## 2018-12-07 MED ORDER — DIAZEPAM 5 MG PO TABS: 10.0000 mg | ORAL_TABLET | ORAL | Status: DC

## 2018-12-07 MED ORDER — TAMSULOSIN HCL 0.4 MG PO CAPS: 0.4000 mg | ORAL_CAPSULE | Freq: Every day | ORAL | 0 refills | Status: DC

## 2018-12-07 MED ORDER — SODIUM CHLORIDE 0.9 % IV SOLN
3.0000 g | Freq: Once | INTRAVENOUS | Status: AC
  Administered 2018-12-07: 3 g via INTRAVENOUS
  Filled 2018-12-07: qty 3

## 2018-12-07 NOTE — Interval H&P Note (Signed)
History and Physical Interval Note:  12/07/2018 10:19 AM  Tina Bell  has presented today for surgery, with the diagnosis of LEFT RENAL CALCULI.  The various methods of treatment have been discussed with the patient and family. After consideration of risks, benefits and other options for treatment, the patient has consented to  Procedure(s): EXTRACORPOREAL SHOCK WAVE LITHOTRIPSY (ESWL) (Left) as a surgical intervention.  The patient's history has been reviewed, patient examined, no change in status, stable for surgery.  I have reviewed the patient's chart and labs.  Questions were answered to the patient's satisfaction.     [employer]

## 2018-12-07 NOTE — Discharge Instructions (Signed)
1. You should strain your urine and collect all fragments and bring them to your follow up appointment.  °2. You should take your pain medication as needed.  Please call if your pain is severe to the point that it is not controlled with your pain medication. °3. You should call if you develop fever > 101 or persistent nausea or vomiting. °4. Your doctor may prescribe tamsulosin to take to help facilitate stone passage. °

## 2018-12-07 NOTE — Op Note (Signed)
See [facility] operative note scanned into chart. Also because of the size, density, location and other factors that cannot be anticipated I feel this will likely be a staged procedure. This fact supersedes any indication in the scanned [facility] operative note to the contrary.  

## 2018-12-08 ENCOUNTER — Encounter (HOSPITAL_COMMUNITY): Payer: Self-pay | Admitting: Urology

## 2018-12-15 ENCOUNTER — Other Ambulatory Visit (HOSPITAL_COMMUNITY): Payer: Medicare Other

## 2018-12-20 ENCOUNTER — Ambulatory Visit: Payer: Medicare Other | Admitting: Obstetrics

## 2018-12-20 ENCOUNTER — Other Ambulatory Visit: Payer: Medicare Other

## 2019-02-01 ENCOUNTER — Other Ambulatory Visit: Payer: Self-pay | Admitting: *Deleted

## 2019-02-01 DIAGNOSIS — I712 Thoracic aortic aneurysm, without rupture, unspecified: Secondary | ICD-10-CM

## 2019-02-12 ENCOUNTER — Other Ambulatory Visit: Payer: Self-pay | Admitting: Obstetrics and Gynecology

## 2019-02-12 DIAGNOSIS — R971 Elevated cancer antigen 125 [CA 125]: Secondary | ICD-10-CM

## 2019-02-14 ENCOUNTER — Encounter: Payer: Self-pay | Admitting: Obstetrics and Gynecology

## 2019-02-19 ENCOUNTER — Ambulatory Visit
Admission: RE | Admit: 2019-02-19 | Discharge: 2019-02-19 | Disposition: A | Discharge status: Home / Self Care | Payer: Medicare Other | Source: Ambulatory Visit | Attending: Obstetrics and Gynecology | Admitting: Obstetrics and Gynecology

## 2019-02-19 DIAGNOSIS — R971 Elevated cancer antigen 125 [CA 125]: Secondary | ICD-10-CM

## 2019-02-19 MED ORDER — IOPAMIDOL (ISOVUE-300) INJECTION 61%
100.0000 mL | Freq: Once | INTRAVENOUS | Status: AC | PRN
  Administered 2019-02-19: 100 mL via INTRAVENOUS

## 2019-02-27 ENCOUNTER — Other Ambulatory Visit: Payer: Self-pay | Admitting: Obstetrics and Gynecology

## 2019-02-27 DIAGNOSIS — Z1231 Encounter for screening mammogram for malignant neoplasm of breast: Secondary | ICD-10-CM

## 2019-04-04 ENCOUNTER — Encounter: Payer: Self-pay | Admitting: Cardiothoracic Surgery

## 2019-04-04 ENCOUNTER — Ambulatory Visit: Payer: Medicare Other | Admitting: Cardiothoracic Surgery

## 2019-04-04 ENCOUNTER — Ambulatory Visit
Admission: RE | Admit: 2019-04-04 | Discharge: 2019-04-04 | Disposition: A | Discharge status: Home / Self Care | Payer: Medicare Other | Source: Ambulatory Visit | Attending: Cardiothoracic Surgery | Admitting: Cardiothoracic Surgery

## 2019-04-04 ENCOUNTER — Other Ambulatory Visit: Payer: Self-pay

## 2019-04-04 VITALS — HR 70 | Temp 97.6°F | Resp 20 | Ht 62.5 in | Wt 117.0 lb

## 2019-04-04 DIAGNOSIS — I712 Thoracic aortic aneurysm, without rupture, unspecified: Secondary | ICD-10-CM

## 2019-04-04 MED ORDER — IOPAMIDOL (ISOVUE-370) INJECTION 76%
75.0000 mL | Freq: Once | INTRAVENOUS | Status: AC | PRN
  Administered 2019-04-04: 75 mL via INTRAVENOUS

## 2019-04-04 NOTE — Progress Notes (Signed)
PCP is Deland Pretty, MD Referring Provider is Deland Pretty, MD  Chief Complaint  Patient presents with  . Thoracic Aortic Aneurysm    1 year f/u with Chest CTA     HPI: 1 year follow-up with CTA for asymptomatic stable 4.1 cm fusiform ascending aneurysm.  Patient had a right VATS In 2016 for right empyema.  She has history of bronchiectasis but no recent pulmonary infections.  She has borderline hypertension and has a mild-moderately dilated ascending aorta.  Today CT images are personally reviewed which show the ascending aorta above the sinotubular junction to have a 4.2 cm diameter.  There is no mural hematoma or ulceration.  Lung windows are fairly clear without infiltrate or suspicious nodules.  Past Medical History:  Diagnosis Date  . Arthritis    knees  . Borderline glaucoma    right eye  . Bronchiectasis ([place])    pulmologist-  dr Lake Bells--   (  per note secondary to severe pertussis as child)   stable per LOV note 10-03-2015  . Dementia ([place])   . Family history of breast cancer   . Family history of ovarian cancer   . Family history of stomach cancer   . H/O pleural empyema    w/ CAP  June 2016  s/p   VAT's with thoracotomy drainage empyema and decortication right lower lobe  . Hiatal hernia   . History of kidney stones 4235,3614  . History of pertussis as a child    severe   . History of recurrent UTIs    none lately  . Pneumonia    hx of 2017 , 2000  . PONV (postoperative nausea and vomiting)    and difficulty waking up  . SCC (squamous cell carcinoma) 11/02/2013   SCC KA Left Cheek  . SCC (squamous cell carcinoma) 12/03/2014   SCC KA Left Ant. Cheek  . SCC (squamous cell carcinoma) 04/07/2018   SCC In Situ Left Post Knee  . SCC (squamous cell carcinoma) 07/26/2018   SCC In Situ Left Hand   . Thoracic ascending aortic aneurysm (HCC)    4.1 cm   per last ct 04-05-18 lov dr Lucianne Lei tright 04-05-18 epic  . Urgency of urination     Past Surgical History:  Procedure  Laterality Date  . BREAST EXCISIONAL BIOPSY Right   . CATARACT EXTRACTION W/ INTRAOCULAR LENS IMPLANT Bilateral 10/2015 and 2018  . CYSTOSCOPY WITH URETEROSCOPY AND STENT PLACEMENT Left 11/07/2015   Procedure: CYSTOSCOPY LEFT URETEROSCOPY WITH LEFT RETROGRADE PYELOGRAM;  Surgeon: Festus Aloe, MD;  Location: [facility];  Service: Urology;  Laterality: Left;  . CYSTOSCOPY/URETEROSCOPY/HOLMIUM LASER/STENT PLACEMENT Left 11/28/2015   Procedure: CYSTOSCOPY LEFT /URETEROSCOPY/HOLMIUM LASER/STENT REPLACEMENT;  Surgeon: Festus Aloe, MD;  Location: [facility];  Service: Urology;  Laterality: Left;  . DILATION AND CURETTAGE OF UTERUS  x3  1980's  . EXTRACORPOREAL SHOCK WAVE LITHOTRIPSY  07-09-2013  . EXTRACORPOREAL SHOCK WAVE LITHOTRIPSY Left 12/07/2018   Procedure: EXTRACORPOREAL SHOCK WAVE LITHOTRIPSY (ESWL);  Surgeon: Raynelle Bring, MD;  Location: [facility];  Service: Urology;  Laterality: Left;  . HOLMIUM LASER APPLICATION Left 4/31/5400   Procedure: LEFT STENT PLACEMENT;  Surgeon: Festus Aloe, MD;  Location: [facility];  Service: Urology;  Laterality: Left;  . HOLMIUM LASER APPLICATION Left 8/67/6195   Procedure: HOLMIUM LASER APPLICATION;  Surgeon: Festus Aloe, MD;  Location[facility];  Service: Urology;  Laterality: Left;  . KNEE ARTHROSCOPY Right 1999  . NASAL SEPTUM SURGERY  1989  .  PUBOVAGINAL SLING  1998   bone anchor  . STONE EXTRACTION WITH BASKET Left 11/28/2015   Procedure: STONE EXTRACTION WITH BASKET;  Surgeon: Festus Aloe, MD;  Location: [facility];  Service: Urology;  Laterality: Left;  . TOTAL KNEE ARTHROPLASTY Left 05/12/2015   Procedure: LEFT TOTAL KNEE ARTHROPLASTY;  Surgeon: Gaynelle Arabian, MD;  Location: [facility];  Service: Orthopedics;  Laterality: Left;  . TUBAL LIGATION  1974  . VIDEO ASSISTED THORACOSCOPY (VATS)/EMPYEMA Right 01/06/2015   Procedure: VIDEO ASSISTED THORACOSCOPY (VATS)/EMPYEMA AND DRAINAGE OF EMPYEMA;   Surgeon: Ivin Poot, MD;  Location[facility];  Service: Thoracic;  Laterality: Right;    Family History  Problem Relation Age of Onset  . Cancer Mother 73       ovarian; d. 33  . Cancer Sister        breast  . Schizophrenia Sister   . Stomach cancer Paternal Uncle   . Stomach cancer Maternal Grandmother 54  . Head & neck cancer Father 16       jaw cancer, possibly from cigar smoking  . Heart attack Maternal Grandfather   . Other Sister        neg BRCA testing  . Breast cancer Neg Hx     Social History Social History   Tobacco Use  . Smoking status: Former Smoker    Packs/day: 0.25    Years: 5.00    Pack years: 1.25    Types: Cigarettes    Quit date: 05/03/1962    Years since quitting: 56.9  . Smokeless tobacco: Never Used  . Tobacco comment: 1 PPWEEK, patient has not smoked in 56years  Substance Use Topics  . Alcohol use: Yes    Comment: jigger every other day   . Drug use: No    Current Outpatient Medications  Medication Sig Dispense Refill  . alendronate (FOSAMAX) 70 MG tablet Take 70 mg by mouth once a week. Take with a full glass of water on an empty stomach. On firday     . Calcium Carbonate-Vit D-Min (CALCIUM 1200 PO) Take 1 capsule by mouth daily.    . dorzolamide (TRUSOPT) 2 % ophthalmic solution Place 1 drop into both eyes daily.    Marland Kitchen latanoprost (XALATAN) 0.005 % ophthalmic solution Place 1 drop into both eyes at bedtime.    . tamsulosin (FLOMAX) 0.4 MG CAPS capsule Take 1 capsule (0.4 mg total) by mouth at bedtime. 30 capsule 0  . UNABLE TO FIND Vitamin d 3 100 iu daily    . amoxicillin (AMOXIL) 500 MG capsule Take 500 mg by mouth 2 (two) times daily.    . benzonatate (TESSALON) 200 MG capsule Take 1 capsule (200 mg total) by mouth 3 (three) times daily as needed for cough. (Patient not taking: Reported on 04/04/2019) 45 capsule 1  . HYDROcodone-acetaminophen (NORCO/VICODIN) 5-325 MG tablet Take 1-2 tablets by mouth every 6 (six) hours as needed. (Patient  not taking: Reported on 04/04/2019) 20 tablet 0   No current facility-administered medications for this visit.    Facility-Administered Medications Ordered in Other Visits  Medication Dose Route Frequency Provider Last Rate Last Dose  . levofloxacin (LEVAQUIN) IVPB 500 mg  500 mg Intravenous Q24H Festus Aloe, MD   500 mg at 11/28/15 1139    Allergies  Allergen Reactions  . Levaquin [Levofloxacin]     Patient has history of fusiform thoracic ascending aneurysm 4.0 cm, hives  . Codeine Other (See Comments)    Hallucination,hives   .  Keflex [Cephalexin] Hives    Tolerated Augmentin  . Morphine And Related Hives  . Macrodantin [Nitrofurantoin Macrocrystal] Rash    Do not take    Review of Systems   Patient had lithotripsy of a kidney stone earlier this year Patient recently had upper and lower endoscopy and was found to have 2 colon polyps which were removed and some evidence of GERD for which she is placed on Prevacid. No chest pain No symptoms of COVID-19 infection-cough fever shortness of breath loss of taste No ankle edema No bleeding problems Pulse 70   Temp 97.6 F (36.4 C) (Skin)   Resp 20   Ht 5' 2.5" (1.588 m)   Wt 117 lb (53.1 kg)   SpO2 96% Comment: RA  BMI 21.06 kg/m   BP 149/90 Physical Exam      Exam    General- alert and comfortable    Neck- no JVD, no cervical adenopathy palpable, no carotid bruit   Lungs- clear without rales, wheezes   Cor- regular rate and rhythm, no murmur , gallop   Abdomen- soft, non-tender   Extremities - warm, non-tender, minimal edema   Neuro- oriented, appropriate, no focal weakness    Diagnostic Tests: CT images personally reviewed with results as noted above  Impression: Stable asymptomatic fusiform aneurysm.  This is not changed in several years.  We will reduce her screening schedule to 18 months.  She is advised to check her blood pressure and record it 2-3 times a week and provide information to her primary care  doctor so that her blood pressure can be adequately treated if necessary  Plan: Return for repeat CTA in 18 months   Len Childs, MD [facility] [phone]

## 2019-04-13 ENCOUNTER — Ambulatory Visit: Payer: Medicare Other

## 2019-04-13 ENCOUNTER — Ambulatory Visit
Admission: RE | Admit: 2019-04-13 | Discharge: 2019-04-13 | Disposition: A | Discharge status: Home / Self Care | Payer: Medicare Other | Source: Ambulatory Visit | Attending: Obstetrics and Gynecology | Admitting: Obstetrics and Gynecology

## 2019-04-13 ENCOUNTER — Other Ambulatory Visit: Payer: Self-pay

## 2019-04-13 DIAGNOSIS — Z1231 Encounter for screening mammogram for malignant neoplasm of breast: Secondary | ICD-10-CM

## 2019-08-26 ENCOUNTER — Ambulatory Visit: Payer: Medicare PPO | Attending: Internal Medicine

## 2019-08-26 DIAGNOSIS — Z23 Encounter for immunization: Secondary | ICD-10-CM | POA: Insufficient documentation

## 2019-08-26 NOTE — Progress Notes (Unsigned)
   Covid-19 Vaccination Clinic  Name:  Tina Bell    MRN: [id number] DOB: 07/09/1936  08/26/2019  [name] was observed post Covid-19 immunization for {COVID Vaccine Observation Times:23551} without incidence. She was provided with Vaccine Information Sheet and instruction to access the V-Safe system.   [name] was instructed to call 911 with any severe reactions post vaccine: Marland Kitchen Difficulty breathing  . Swelling of your face and throat  . A fast heartbeat  . A bad rash all over your body  . Dizziness and weakness

## 2019-09-15 ENCOUNTER — Ambulatory Visit: Payer: Medicare PPO | Attending: Internal Medicine

## 2019-09-15 DIAGNOSIS — Z23 Encounter for immunization: Secondary | ICD-10-CM

## 2019-09-15 NOTE — Progress Notes (Signed)
   Covid-19 Vaccination Clinic  Name:  KIMM HYNEK    MRN: [id number] DOB: 05-14-1936  09/15/2019  Ms. Bartunek was observed post Covid-19 immunization for 15 minutes without incidence. She was provided with Vaccine Information Sheet and instruction to access the V-Safe system.   Ms. Bendure was instructed to call 911 with any severe reactions post vaccine: Marland Kitchen Difficulty breathing  . Swelling of your face and throat  . A fast heartbeat  . A bad rash all over your body  . Dizziness and weakness    Immunizations Administered    Name Date Dose VIS Date Route   Pfizer COVID-19 Vaccine 09/15/2019 11:22 AM 0.3 mL 07/20/2019 Intramuscular   Manufacturer: [place]   Lot: [id number]   [place]: [id number]

## 2019-09-26 DIAGNOSIS — H401111 Primary open-angle glaucoma, right eye, mild stage: Secondary | ICD-10-CM | POA: Diagnosis not present

## 2019-09-26 DIAGNOSIS — H02055 Trichiasis without entropian left lower eyelid: Secondary | ICD-10-CM | POA: Diagnosis not present

## 2019-09-26 DIAGNOSIS — Z961 Presence of intraocular lens: Secondary | ICD-10-CM | POA: Diagnosis not present

## 2019-09-26 DIAGNOSIS — H02052 Trichiasis without entropian right lower eyelid: Secondary | ICD-10-CM | POA: Diagnosis not present

## 2019-09-26 DIAGNOSIS — H40022 Open angle with borderline findings, high risk, left eye: Secondary | ICD-10-CM | POA: Diagnosis not present

## 2019-09-26 DIAGNOSIS — H26491 Other secondary cataract, right eye: Secondary | ICD-10-CM | POA: Diagnosis not present

## 2019-11-20 ENCOUNTER — Ambulatory Visit: Payer: Medicare PPO | Admitting: Physician Assistant

## 2019-12-21 ENCOUNTER — Ambulatory Visit: Payer: Medicare PPO | Admitting: Physician Assistant

## 2019-12-21 ENCOUNTER — Other Ambulatory Visit: Payer: Self-pay

## 2019-12-21 ENCOUNTER — Encounter: Payer: Self-pay | Admitting: Physician Assistant

## 2019-12-21 DIAGNOSIS — L570 Actinic keratosis: Secondary | ICD-10-CM | POA: Diagnosis not present

## 2019-12-21 DIAGNOSIS — Z1283 Encounter for screening for malignant neoplasm of skin: Secondary | ICD-10-CM | POA: Diagnosis not present

## 2019-12-21 DIAGNOSIS — B078 Other viral warts: Secondary | ICD-10-CM | POA: Diagnosis not present

## 2019-12-21 NOTE — Patient Instructions (Signed)

## 2019-12-21 NOTE — Progress Notes (Signed)
   Follow-Up Visit   Subjective  Tina Bell is a 84 y.o. female who presents for the following: Annual Exam (Here this afternoon for a general look over. Does have a couple places that she would like frozen; right upper hairline and left neck.).   The following portions of the chart were reviewed this encounter and updated as appropriate: Tobacco  Allergies  Meds  Problems  Med Hx  Surg Hx  Fam Hx      Objective  Well appearing patient in no apparent distress; mood and affect are within normal limits.  A full examination was performed including scalp, head, eyes, ears, nose, lips, neck, chest, axillae, abdomen, back, buttocks, bilateral upper extremities, bilateral lower extremities, hands, feet, fingers, toes, fingernails, and toenails. All findings within normal limits unless otherwise noted below.  Objective  Chest - Medial (Center), Mid Back, Mid Forehead, Right Elbow - Posterior, Right Forearm - Posterior, Right Zygomatic Area: Erythematous patches with gritty scale.  Objective  Left Anterior Neck: Verrucous papules -- Discussed viral etiology and contagion.   Objective  Head - to toe: No DN   Assessment & Plan  AK (actinic keratosis) (6) Right Elbow - Posterior; Right Forearm - Posterior; Chest - Medial [facility]); Mid Back; Mid Forehead; Right Zygomatic Area  Destruction of lesion - Chest - Medial (Center), Mid Back, Mid Forehead, Right Elbow - Posterior, Right Forearm - Posterior, Right Zygomatic Area Complexity: simple   Destruction method: cryotherapy   Informed consent: discussed and consent obtained   Timeout:  patient name, date of birth, surgical site, and procedure verified Lesion destroyed using liquid nitrogen: Yes   Cryotherapy cycles:  1 Outcome: patient tolerated procedure well with no complications   Post-procedure details: wound care instructions given    Common wart Left Anterior Neck  Destruction of lesion - Left Anterior Neck Complexity:  simple   Destruction method: cryotherapy   Informed consent: discussed and consent obtained   Timeout:  patient name, date of birth, surgical site, and procedure verified Lesion destroyed using liquid nitrogen: Yes   Cryotherapy cycles:  1 Outcome: patient tolerated procedure well with no complications   Post-procedure details: wound care instructions given    Screening exam for skin cancer Head - to toe  Skin exams

## 2020-01-23 DIAGNOSIS — H26491 Other secondary cataract, right eye: Secondary | ICD-10-CM | POA: Diagnosis not present

## 2020-01-23 DIAGNOSIS — H401111 Primary open-angle glaucoma, right eye, mild stage: Secondary | ICD-10-CM | POA: Diagnosis not present

## 2020-01-23 DIAGNOSIS — H02055 Trichiasis without entropian left lower eyelid: Secondary | ICD-10-CM | POA: Diagnosis not present

## 2020-01-23 DIAGNOSIS — Z961 Presence of intraocular lens: Secondary | ICD-10-CM | POA: Diagnosis not present

## 2020-01-23 DIAGNOSIS — H02052 Trichiasis without entropian right lower eyelid: Secondary | ICD-10-CM | POA: Diagnosis not present

## 2020-01-23 DIAGNOSIS — H40022 Open angle with borderline findings, high risk, left eye: Secondary | ICD-10-CM | POA: Diagnosis not present

## 2020-02-28 ENCOUNTER — Other Ambulatory Visit: Payer: Self-pay | Admitting: Obstetrics and Gynecology

## 2020-02-28 DIAGNOSIS — Z1231 Encounter for screening mammogram for malignant neoplasm of breast: Secondary | ICD-10-CM

## 2020-03-03 DIAGNOSIS — M810 Age-related osteoporosis without current pathological fracture: Secondary | ICD-10-CM | POA: Diagnosis not present

## 2020-03-03 DIAGNOSIS — Z85828 Personal history of other malignant neoplasm of skin: Secondary | ICD-10-CM | POA: Diagnosis not present

## 2020-03-03 DIAGNOSIS — Z7983 Long term (current) use of bisphosphonates: Secondary | ICD-10-CM | POA: Diagnosis not present

## 2020-03-03 DIAGNOSIS — H409 Unspecified glaucoma: Secondary | ICD-10-CM | POA: Diagnosis not present

## 2020-03-03 DIAGNOSIS — K219 Gastro-esophageal reflux disease without esophagitis: Secondary | ICD-10-CM | POA: Diagnosis not present

## 2020-03-03 DIAGNOSIS — M1990 Unspecified osteoarthritis, unspecified site: Secondary | ICD-10-CM | POA: Diagnosis not present

## 2020-03-03 DIAGNOSIS — R030 Elevated blood-pressure reading, without diagnosis of hypertension: Secondary | ICD-10-CM | POA: Diagnosis not present

## 2020-03-03 DIAGNOSIS — Z809 Family history of malignant neoplasm, unspecified: Secondary | ICD-10-CM | POA: Diagnosis not present

## 2020-03-03 DIAGNOSIS — R05 Cough: Secondary | ICD-10-CM | POA: Diagnosis not present

## 2020-03-27 ENCOUNTER — Other Ambulatory Visit: Payer: Self-pay

## 2020-03-27 ENCOUNTER — Ambulatory Visit: Payer: Medicare PPO | Admitting: Physician Assistant

## 2020-03-27 ENCOUNTER — Encounter: Payer: Self-pay | Admitting: Physician Assistant

## 2020-03-27 DIAGNOSIS — L820 Inflamed seborrheic keratosis: Secondary | ICD-10-CM | POA: Diagnosis not present

## 2020-03-27 DIAGNOSIS — Z85828 Personal history of other malignant neoplasm of skin: Secondary | ICD-10-CM | POA: Diagnosis not present

## 2020-03-27 DIAGNOSIS — Z1283 Encounter for screening for malignant neoplasm of skin: Secondary | ICD-10-CM | POA: Diagnosis not present

## 2020-03-27 NOTE — Progress Notes (Signed)
   Follow-Up Visit   Subjective  Tina Bell is a 84 y.o. female who presents for the following: Follow-up (skin check).   The following portions of the chart were reviewed this encounter and updated as appropriate: Tobacco  Allergies  Meds  Problems  Med Hx  Surg Hx  Fam Hx      Objective  Well appearing patient in no apparent distress; mood and affect are within normal limits.  A full examination was performed including scalp, head, eyes, ears, nose, lips, neck, chest, axillae, abdomen, back, buttocks, bilateral upper extremities, bilateral lower extremities, hands, feet, fingers, toes, fingernails, and toenails. All findings within normal limits unless otherwise noted below.  Objective  Head - to toe: No atypical nevi No signs of non-mole skin cancer.   Objective  Chest - Medial (Center) (3), Left Thigh - Anterior, Left Zygomatic Area (3), Mid Back, Right Forearm - Posterior, Right Frontal Scalp, Right Hand - Posterior, Right Lower Leg - Anterior, Right Zygomatic Area: Erythematous stuck-on, waxy papule or plaque.   Objective  Right Hand - Posterior: Scars clear  Assessment & Plan  Screening exam for skin cancer Head - to toe  Biannual skin exams  Inflamed seborrheic keratosis (13) Right Hand - Posterior; Right Forearm - Posterior; Left Thigh - Anterior; Right Lower Leg - Anterior; Chest - Medial [facility]) (3); Mid Back; Right Frontal Scalp; Left Zygomatic Area (3); Right Zygomatic Area  Destruction of lesion - Chest - Medial (Center), Left Thigh - Anterior, Left Zygomatic Area, Mid Back, Right Forearm - Posterior, Right Frontal Scalp, Right Hand - Posterior, Right Lower Leg - Anterior, Right Zygomatic Area Complexity: simple   Destruction method: cryotherapy   Informed consent: discussed and consent obtained   Timeout:  patient name, date of birth, surgical site, and procedure verified Lesion destroyed using liquid nitrogen: Yes   Outcome: patient tolerated  procedure well with no complications   Post-procedure details: wound care instructions given    History of SCC (squamous cell carcinoma) of skin Right Hand - Posterior  observe   I, Satomi Buda, PA-C, have reviewed all documentation's for this visit.  The documentation on 03/27/20 for the exam, diagnosis, procedures and orders are all accurate and complete.

## 2020-04-16 ENCOUNTER — Ambulatory Visit
Admission: RE | Admit: 2020-04-16 | Discharge: 2020-04-16 | Disposition: A | Discharge status: Home / Self Care | Payer: Medicare PPO | Source: Ambulatory Visit | Attending: Obstetrics and Gynecology | Admitting: Obstetrics and Gynecology

## 2020-04-16 ENCOUNTER — Other Ambulatory Visit: Payer: Self-pay

## 2020-04-16 DIAGNOSIS — Z1231 Encounter for screening mammogram for malignant neoplasm of breast: Secondary | ICD-10-CM

## 2020-04-18 ENCOUNTER — Other Ambulatory Visit: Payer: Self-pay | Admitting: Obstetrics and Gynecology

## 2020-04-18 DIAGNOSIS — R928 Other abnormal and inconclusive findings on diagnostic imaging of breast: Secondary | ICD-10-CM

## 2020-04-28 DIAGNOSIS — M1711 Unilateral primary osteoarthritis, right knee: Secondary | ICD-10-CM | POA: Diagnosis not present

## 2020-05-01 ENCOUNTER — Ambulatory Visit
Admission: RE | Admit: 2020-05-01 | Discharge: 2020-05-01 | Disposition: A | Discharge status: Home / Self Care | Payer: Medicare PPO | Source: Ambulatory Visit | Attending: Obstetrics and Gynecology | Admitting: Obstetrics and Gynecology

## 2020-05-01 ENCOUNTER — Other Ambulatory Visit: Payer: Self-pay | Admitting: Obstetrics and Gynecology

## 2020-05-01 ENCOUNTER — Other Ambulatory Visit: Payer: Self-pay

## 2020-05-01 DIAGNOSIS — R928 Other abnormal and inconclusive findings on diagnostic imaging of breast: Secondary | ICD-10-CM

## 2020-05-01 DIAGNOSIS — N6310 Unspecified lump in the right breast, unspecified quadrant: Secondary | ICD-10-CM

## 2020-05-01 DIAGNOSIS — N6489 Other specified disorders of breast: Secondary | ICD-10-CM | POA: Diagnosis not present

## 2020-05-02 DIAGNOSIS — Z23 Encounter for immunization: Secondary | ICD-10-CM | POA: Diagnosis not present

## 2020-05-06 DIAGNOSIS — Z6822 Body mass index (BMI) 22.0-22.9, adult: Secondary | ICD-10-CM | POA: Diagnosis not present

## 2020-05-06 DIAGNOSIS — Z124 Encounter for screening for malignant neoplasm of cervix: Secondary | ICD-10-CM | POA: Diagnosis not present

## 2020-05-06 DIAGNOSIS — N952 Postmenopausal atrophic vaginitis: Secondary | ICD-10-CM | POA: Diagnosis not present

## 2020-05-26 DIAGNOSIS — H401111 Primary open-angle glaucoma, right eye, mild stage: Secondary | ICD-10-CM | POA: Diagnosis not present

## 2020-05-26 DIAGNOSIS — H02055 Trichiasis without entropian left lower eyelid: Secondary | ICD-10-CM | POA: Diagnosis not present

## 2020-05-26 DIAGNOSIS — H02052 Trichiasis without entropian right lower eyelid: Secondary | ICD-10-CM | POA: Diagnosis not present

## 2020-05-26 DIAGNOSIS — H40022 Open angle with borderline findings, high risk, left eye: Secondary | ICD-10-CM | POA: Diagnosis not present

## 2020-05-26 DIAGNOSIS — Z961 Presence of intraocular lens: Secondary | ICD-10-CM | POA: Diagnosis not present

## 2020-05-26 DIAGNOSIS — H26492 Other secondary cataract, left eye: Secondary | ICD-10-CM | POA: Diagnosis not present

## 2020-07-07 DIAGNOSIS — E7800 Pure hypercholesterolemia, unspecified: Secondary | ICD-10-CM | POA: Diagnosis not present

## 2020-07-07 DIAGNOSIS — D649 Anemia, unspecified: Secondary | ICD-10-CM | POA: Diagnosis not present

## 2020-07-07 DIAGNOSIS — R971 Elevated cancer antigen 125 [CA 125]: Secondary | ICD-10-CM | POA: Diagnosis not present

## 2020-07-07 DIAGNOSIS — N390 Urinary tract infection, site not specified: Secondary | ICD-10-CM | POA: Diagnosis not present

## 2020-07-07 DIAGNOSIS — Z791 Long term (current) use of non-steroidal anti-inflammatories (NSAID): Secondary | ICD-10-CM | POA: Diagnosis not present

## 2020-07-07 DIAGNOSIS — Z8041 Family history of malignant neoplasm of ovary: Secondary | ICD-10-CM | POA: Diagnosis not present

## 2020-07-14 DIAGNOSIS — I712 Thoracic aortic aneurysm, without rupture: Secondary | ICD-10-CM | POA: Diagnosis not present

## 2020-07-14 DIAGNOSIS — Z0000 Encounter for general adult medical examination without abnormal findings: Secondary | ICD-10-CM | POA: Diagnosis not present

## 2020-07-14 DIAGNOSIS — J479 Bronchiectasis, uncomplicated: Secondary | ICD-10-CM | POA: Diagnosis not present

## 2020-07-14 DIAGNOSIS — R030 Elevated blood-pressure reading, without diagnosis of hypertension: Secondary | ICD-10-CM | POA: Diagnosis not present

## 2020-07-14 DIAGNOSIS — K2100 Gastro-esophageal reflux disease with esophagitis, without bleeding: Secondary | ICD-10-CM | POA: Diagnosis not present

## 2020-07-14 DIAGNOSIS — Z87442 Personal history of urinary calculi: Secondary | ICD-10-CM | POA: Diagnosis not present

## 2020-07-14 DIAGNOSIS — Z8041 Family history of malignant neoplasm of ovary: Secondary | ICD-10-CM | POA: Diagnosis not present

## 2020-07-14 DIAGNOSIS — R971 Elevated cancer antigen 125 [CA 125]: Secondary | ICD-10-CM | POA: Diagnosis not present

## 2020-07-14 DIAGNOSIS — I714 Abdominal aortic aneurysm, without rupture: Secondary | ICD-10-CM | POA: Diagnosis not present

## 2020-07-28 DIAGNOSIS — I10 Essential (primary) hypertension: Secondary | ICD-10-CM | POA: Diagnosis not present

## 2020-08-26 ENCOUNTER — Other Ambulatory Visit: Payer: Self-pay | Admitting: *Deleted

## 2020-08-26 DIAGNOSIS — I712 Thoracic aortic aneurysm, without rupture, unspecified: Secondary | ICD-10-CM

## 2020-08-28 DIAGNOSIS — I712 Thoracic aortic aneurysm, without rupture: Secondary | ICD-10-CM | POA: Diagnosis not present

## 2020-08-28 DIAGNOSIS — I10 Essential (primary) hypertension: Secondary | ICD-10-CM | POA: Diagnosis not present

## 2020-09-09 DIAGNOSIS — M1711 Unilateral primary osteoarthritis, right knee: Secondary | ICD-10-CM | POA: Diagnosis not present

## 2020-09-17 DIAGNOSIS — I712 Thoracic aortic aneurysm, without rupture: Secondary | ICD-10-CM | POA: Diagnosis not present

## 2020-09-17 DIAGNOSIS — I10 Essential (primary) hypertension: Secondary | ICD-10-CM | POA: Diagnosis not present

## 2020-09-30 ENCOUNTER — Other Ambulatory Visit: Payer: Self-pay

## 2020-09-30 ENCOUNTER — Encounter: Payer: Self-pay | Admitting: Physician Assistant

## 2020-09-30 ENCOUNTER — Ambulatory Visit: Payer: Medicare PPO | Admitting: Physician Assistant

## 2020-09-30 DIAGNOSIS — L820 Inflamed seborrheic keratosis: Secondary | ICD-10-CM | POA: Diagnosis not present

## 2020-09-30 DIAGNOSIS — Z1283 Encounter for screening for malignant neoplasm of skin: Secondary | ICD-10-CM | POA: Diagnosis not present

## 2020-09-30 DIAGNOSIS — Z85828 Personal history of other malignant neoplasm of skin: Secondary | ICD-10-CM

## 2020-09-30 DIAGNOSIS — D1800 Hemangioma unspecified site: Secondary | ICD-10-CM | POA: Diagnosis not present

## 2020-09-30 DIAGNOSIS — L570 Actinic keratosis: Secondary | ICD-10-CM

## 2020-10-06 ENCOUNTER — Other Ambulatory Visit: Payer: Self-pay

## 2020-10-06 ENCOUNTER — Ambulatory Visit
Admission: RE | Admit: 2020-10-06 | Discharge: 2020-10-06 | Disposition: A | Discharge status: Home / Self Care | Payer: Medicare PPO | Source: Ambulatory Visit | Attending: Cardiothoracic Surgery | Admitting: Cardiothoracic Surgery

## 2020-10-06 ENCOUNTER — Ambulatory Visit: Payer: Medicare PPO | Admitting: Cardiothoracic Surgery

## 2020-10-06 VITALS — BP 143/84 | HR 62 | Resp 20 | Wt 118.0 lb

## 2020-10-06 DIAGNOSIS — I712 Thoracic aortic aneurysm, without rupture, unspecified: Secondary | ICD-10-CM

## 2020-10-06 DIAGNOSIS — I700 Atherosclerosis of aorta: Secondary | ICD-10-CM | POA: Diagnosis not present

## 2020-10-06 DIAGNOSIS — J9811 Atelectasis: Secondary | ICD-10-CM | POA: Diagnosis not present

## 2020-10-06 DIAGNOSIS — J929 Pleural plaque without asbestos: Secondary | ICD-10-CM | POA: Diagnosis not present

## 2020-10-06 MED ORDER — IOPAMIDOL (ISOVUE-370) INJECTION 76%
75.0000 mL | Freq: Once | INTRAVENOUS | Status: AC | PRN
  Administered 2020-10-06: 75 mL via INTRAVENOUS

## 2020-10-09 NOTE — Progress Notes (Signed)
85 year old lady presents for evaluation of sub-5 cm ascending aortic aneurysm.  She is a former patient of Dr. Prescott Gum, and she has been followed for over 3 years with a stable aneurysm.  Her past medical history is also notable for COPD and bronchiectasis.  Generally speaking she has been doing well.  She denies chest pain or back pain.  Active Ambulatory Problems    Diagnosis Date Noted  . Bronchiectasis without acute exacerbation ([place]) 01/09/2014  . OA (osteoarthritis) of knee 05/12/2015  . Cough 05/12/2016  . Hiatal hernia 05/12/2016  . Acute bronchitis 08/31/2016  . GERD (gastroesophageal reflux disease) 08/31/2016  . Bronchiectasis with (acute) exacerbation ([place]) 09/14/2016  . Family history of ovarian cancer   . Family history of breast cancer   . Family history of stomach cancer   . Melanoma of skin ([place]) 08/16/2018  . Genetic testing 09/07/2018   Resolved Ambulatory Problems    Diagnosis Date Noted  . CAP (community acquired pneumonia) 01/02/2014  . Empyema of lung ([place]) 01/06/2015  . Empyema lung ([place]) 01/14/2015   Past Medical History:  Diagnosis Date  . Arthritis   . Borderline glaucoma   . Bronchiectasis ([place])   . Dementia ([place])   . H/O pleural empyema   . History of kidney stones 9528,4132  . History of pertussis as a child   . History of recurrent UTIs   . Pneumonia   . PONV (postoperative nausea and vomiting)   . SCC (squamous cell carcinoma) 11/02/2013  . SCC (squamous cell carcinoma) 12/03/2014  . SCC (squamous cell carcinoma) 04/07/2018  . SCC (squamous cell carcinoma) 07/26/2018  . Thoracic ascending aortic aneurysm ([place])   . Urgency of urination    Outpatient Encounter Medications as of 10/06/2020  Medication Sig  . alendronate (FOSAMAX) 70 MG tablet Take 70 mg by mouth once a week. Take with a full glass of water on an empty stomach. On firday  . Calcium Carbonate-Vit D-Min (CALCIUM 1200 PO) Take 1 capsule by mouth daily.  . calcium-vitamin D  (CAL-CITRATE PLUS VITAMIN D) 250-100 MG-UNIT tablet Take by mouth.  . losartan (COZAAR) 50 MG tablet 0.5 tablet  . omeprazole (PRILOSEC) 40 MG capsule   . UNABLE TO FIND Vitamin d 3 100 iu daily  . dorzolamide (TRUSOPT) 2 % ophthalmic solution Place 1 drop into both eyes daily. (Patient not taking: Reported on 10/06/2020)  . latanoprost (XALATAN) 0.005 % ophthalmic solution Place 1 drop into both eyes at bedtime. (Patient not taking: Reported on 10/06/2020)  . tamsulosin (FLOMAX) 0.4 MG CAPS capsule  (Patient not taking: Reported on 10/06/2020)   Facility-Administered Encounter Medications as of 10/06/2020  Medication  . levofloxacin (LEVAQUIN) IVPB 500 mg  Physical exam:  BP (!) 143/84 (BP Location: Left Arm, Patient Position: Sitting)   Pulse 62   Resp 20   Wt 53.5 kg   SpO2 96% Comment: RA  BMI 21.24 kg/m    Well-appearing elderly lady no acute distress Clear to auscultation bilaterally  regular rate and rhythm Extremities well perfused  Imaging: I have personally reviewed her available imaging studies including CT chest from October 06, 2020 which demonstrates stable 41 mm ascending aortic aneurysm.  Impression:  Stable ascending aortic aneurysm.  Plan: The patient is not interested in elective surgery particularly as she ages.  This is further emphasized by her awareness of needing open heart surgery in order to fix an aneurysm in this location. Recommend follow-up as needed.  Cru Kritikos Z. Orvan Seen, [place]

## 2020-10-27 ENCOUNTER — Encounter: Payer: Self-pay | Admitting: Physician Assistant

## 2020-10-27 NOTE — Progress Notes (Signed)
   Follow-Up Visit   Subjective  Tina Bell is a 85 y.o. female who presents for the following: Annual Exam (Patient wants full body check spot on hands, face, and legs she wants checked no bleeding ).   The following portions of the chart were reviewed this encounter and updated as appropriate:  Tobacco  Allergies  Meds  Problems  Med Hx  Surg Hx  Fam Hx      Objective  Well appearing patient in no apparent distress; mood and affect are within normal limits.  A full examination was performed including scalp, head, eyes, ears, nose, lips, neck, chest, axillae, abdomen, back, buttocks, bilateral upper extremities, bilateral lower extremities, hands, feet, fingers, toes, fingernails, and toenails. All findings within normal limits unless otherwise noted below.  Objective  head to toe: Full body skin examination- no atypical moles or non mole skin cancer  Objective  Left Lower Leg - Anterior: Erythematous patches with gritty scale.  Objective  Left Forehead (5), Right Breast (5), Right Lower Back (10), Right Malar Cheek (5): Erythematous stuck-on, waxy papule or plaque.   Objective  Right Upper Back: Multiple red raised papule   Assessment & Plan  Encounter for screening for malignant neoplasm of skin head to toe  Yearly skin check  AK (actinic keratosis) Left Lower Leg - Anterior  Destruction of lesion - Left Lower Leg - Anterior Complexity: simple   Destruction method: cryotherapy   Informed consent: discussed and consent obtained   Timeout:  patient name, date of birth, surgical site, and procedure verified Lesion destroyed using liquid nitrogen: Yes   Cryotherapy cycles:  3 Outcome: patient tolerated procedure well with no complications    Seborrheic keratosis, inflamed (25) Right Breast (5); Right Lower Back (10); Right Malar Cheek (5); Left Forehead (5)  Destruction of lesion - Left Forehead, Right Breast, Right Lower Back, Right Malar  Cheek Complexity: simple   Destruction method: cryotherapy   Informed consent: discussed and consent obtained   Timeout:  patient name, date of birth, surgical site, and procedure verified Lesion destroyed using liquid nitrogen: Yes   Cryotherapy cycles:  3 Outcome: patient tolerated procedure well with no complications    Hemangioma, unspecified site Right Upper Back  Okay to leave if stable    I, Jeffifer Rabold, PA-C, have reviewed all documentation's for this visit.  The documentation on 10/27/20 for the exam, diagnosis, procedures and orders are all accurate and complete.

## 2020-10-30 ENCOUNTER — Other Ambulatory Visit: Payer: Medicare PPO

## 2020-12-08 ENCOUNTER — Other Ambulatory Visit: Payer: Self-pay

## 2020-12-08 ENCOUNTER — Ambulatory Visit
Admission: RE | Admit: 2020-12-08 | Discharge: 2020-12-08 | Disposition: A | Discharge status: Home / Self Care | Payer: Medicare PPO | Source: Ambulatory Visit | Attending: Obstetrics and Gynecology | Admitting: Obstetrics and Gynecology

## 2020-12-08 DIAGNOSIS — N6489 Other specified disorders of breast: Secondary | ICD-10-CM | POA: Diagnosis not present

## 2020-12-08 DIAGNOSIS — N6310 Unspecified lump in the right breast, unspecified quadrant: Secondary | ICD-10-CM

## 2020-12-08 DIAGNOSIS — R922 Inconclusive mammogram: Secondary | ICD-10-CM | POA: Diagnosis not present

## 2020-12-09 DIAGNOSIS — H401111 Primary open-angle glaucoma, right eye, mild stage: Secondary | ICD-10-CM | POA: Diagnosis not present

## 2020-12-09 DIAGNOSIS — Z961 Presence of intraocular lens: Secondary | ICD-10-CM | POA: Diagnosis not present

## 2020-12-09 DIAGNOSIS — H02052 Trichiasis without entropian right lower eyelid: Secondary | ICD-10-CM | POA: Diagnosis not present

## 2020-12-09 DIAGNOSIS — H40022 Open angle with borderline findings, high risk, left eye: Secondary | ICD-10-CM | POA: Diagnosis not present

## 2020-12-09 DIAGNOSIS — H02055 Trichiasis without entropian left lower eyelid: Secondary | ICD-10-CM | POA: Diagnosis not present

## 2021-01-02 DIAGNOSIS — N3281 Overactive bladder: Secondary | ICD-10-CM | POA: Diagnosis not present

## 2021-01-02 DIAGNOSIS — R350 Frequency of micturition: Secondary | ICD-10-CM | POA: Diagnosis not present

## 2021-01-14 DIAGNOSIS — R350 Frequency of micturition: Secondary | ICD-10-CM | POA: Diagnosis not present

## 2021-01-14 DIAGNOSIS — I10 Essential (primary) hypertension: Secondary | ICD-10-CM | POA: Diagnosis not present

## 2021-01-14 DIAGNOSIS — N3281 Overactive bladder: Secondary | ICD-10-CM | POA: Diagnosis not present

## 2021-01-27 DIAGNOSIS — M1990 Unspecified osteoarthritis, unspecified site: Secondary | ICD-10-CM | POA: Diagnosis not present

## 2021-01-27 DIAGNOSIS — M810 Age-related osteoporosis without current pathological fracture: Secondary | ICD-10-CM | POA: Diagnosis not present

## 2021-01-27 DIAGNOSIS — K219 Gastro-esophageal reflux disease without esophagitis: Secondary | ICD-10-CM | POA: Diagnosis not present

## 2021-01-27 DIAGNOSIS — I10 Essential (primary) hypertension: Secondary | ICD-10-CM | POA: Diagnosis not present

## 2021-01-27 DIAGNOSIS — G8929 Other chronic pain: Secondary | ICD-10-CM | POA: Diagnosis not present

## 2021-01-27 DIAGNOSIS — I714 Abdominal aortic aneurysm, without rupture: Secondary | ICD-10-CM | POA: Diagnosis not present

## 2021-01-27 DIAGNOSIS — Z803 Family history of malignant neoplasm of breast: Secondary | ICD-10-CM | POA: Diagnosis not present

## 2021-01-27 DIAGNOSIS — Z7983 Long term (current) use of bisphosphonates: Secondary | ICD-10-CM | POA: Diagnosis not present

## 2021-01-27 DIAGNOSIS — R32 Unspecified urinary incontinence: Secondary | ICD-10-CM | POA: Diagnosis not present

## 2021-04-01 DIAGNOSIS — M1711 Unilateral primary osteoarthritis, right knee: Secondary | ICD-10-CM | POA: Diagnosis not present

## 2021-04-02 ENCOUNTER — Ambulatory Visit: Payer: Medicare PPO | Admitting: Physician Assistant

## 2021-04-02 ENCOUNTER — Encounter: Payer: Self-pay | Admitting: Physician Assistant

## 2021-04-02 ENCOUNTER — Other Ambulatory Visit: Payer: Self-pay

## 2021-04-02 DIAGNOSIS — L820 Inflamed seborrheic keratosis: Secondary | ICD-10-CM

## 2021-04-02 DIAGNOSIS — L570 Actinic keratosis: Secondary | ICD-10-CM | POA: Diagnosis not present

## 2021-04-02 DIAGNOSIS — Z1283 Encounter for screening for malignant neoplasm of skin: Secondary | ICD-10-CM | POA: Diagnosis not present

## 2021-04-08 DIAGNOSIS — H02052 Trichiasis without entropian right lower eyelid: Secondary | ICD-10-CM | POA: Diagnosis not present

## 2021-04-08 DIAGNOSIS — Z961 Presence of intraocular lens: Secondary | ICD-10-CM | POA: Diagnosis not present

## 2021-04-08 DIAGNOSIS — H40022 Open angle with borderline findings, high risk, left eye: Secondary | ICD-10-CM | POA: Diagnosis not present

## 2021-04-08 DIAGNOSIS — H02055 Trichiasis without entropian left lower eyelid: Secondary | ICD-10-CM | POA: Diagnosis not present

## 2021-04-08 DIAGNOSIS — H401111 Primary open-angle glaucoma, right eye, mild stage: Secondary | ICD-10-CM | POA: Diagnosis not present

## 2021-04-17 DIAGNOSIS — Z23 Encounter for immunization: Secondary | ICD-10-CM | POA: Diagnosis not present

## 2021-04-20 ENCOUNTER — Encounter: Payer: Self-pay | Admitting: Physician Assistant

## 2021-04-20 NOTE — Progress Notes (Signed)
   Follow-Up Visit   Subjective  Tina Bell is a 85 y.o. female who presents for the following: Follow-up (6 month follow up).   The following portions of the chart were reviewed this encounter and updated as appropriate:  Tobacco  Allergies  Meds  Problems  Med Hx  Surg Hx  Fam Hx      Objective  Well appearing patient in no apparent distress; mood and affect are within normal limits.  A full examination was performed including scalp, head, eyes, ears, nose, lips, neck, chest, axillae, abdomen, back, buttocks, bilateral upper extremities, bilateral lower extremities, hands, feet, fingers, toes, fingernails, and toenails. All findings within normal limits unless otherwise noted below.  Left Chest (2), Left Leg (5), Left Parotid Area (4), Left Posterior Neck, Left Shoulder - Posterior (2), Right 4th Finger Proximal Interphalangeal Joint, Right Forearm - Anterior (3), Right Leg (8), Right Malar Cheek (3), Right Shoulder - Anterior, Torso - Posterior (Back) (8) Erythematous stuck-on, waxy papule or plaque.        Chest - Medial [facility]) Erythematous patches with gritty scale.   Assessment & Plan  Inflamed seborrheic keratosis Right Shoulder - Anterior; Left Shoulder - Posterior (2); Right Forearm - Anterior (3); Left Chest (2); Torso - Posterior (Back) (8); Right 4th Finger Proximal Interphalangeal Joint; Left Parotid Area (4); Right Malar Cheek (3); Left Posterior Neck; Left Leg (5); Right Leg (8)  Destruction of lesion - Left Chest, Left Leg, Left Parotid Area, Left Posterior Neck, Left Shoulder - Posterior, Right 4th Finger Proximal Interphalangeal Joint, Right Forearm - Anterior, Right Leg, Right Malar Cheek, Right Shoulder - Anterior, Torso - Posterior (Back) Complexity: simple   Destruction method: cryotherapy   Informed consent: discussed and consent obtained   Timeout:  patient name, date of birth, surgical site, and procedure verified Lesion destroyed using liquid  nitrogen: Yes   Cryotherapy cycles:  3 Outcome: patient tolerated procedure well with no complications    AK (actinic keratosis) Chest - Medial ([place])  Destruction of lesion - Chest - Medial (Center) Complexity: simple   Destruction method: cryotherapy   Informed consent: discussed and consent obtained   Timeout:  patient name, date of birth, surgical site, and procedure verified Lesion destroyed using liquid nitrogen: Yes   Cryotherapy cycles:  3 Outcome: patient tolerated procedure well with no complications      I, Kaeden Depaz, PA-C, have reviewed all documentation's for this visit.  The documentation on 04/20/21 for the exam, diagnosis, procedures and orders are all accurate and complete.

## 2021-06-15 DIAGNOSIS — N952 Postmenopausal atrophic vaginitis: Secondary | ICD-10-CM | POA: Diagnosis not present

## 2021-06-15 DIAGNOSIS — Z01419 Encounter for gynecological examination (general) (routine) without abnormal findings: Secondary | ICD-10-CM | POA: Diagnosis not present

## 2021-06-15 DIAGNOSIS — Z1231 Encounter for screening mammogram for malignant neoplasm of breast: Secondary | ICD-10-CM | POA: Diagnosis not present

## 2021-06-15 DIAGNOSIS — Z6822 Body mass index (BMI) 22.0-22.9, adult: Secondary | ICD-10-CM | POA: Diagnosis not present

## 2021-06-18 ENCOUNTER — Other Ambulatory Visit: Payer: Self-pay | Admitting: Obstetrics and Gynecology

## 2021-06-18 DIAGNOSIS — R928 Other abnormal and inconclusive findings on diagnostic imaging of breast: Secondary | ICD-10-CM

## 2021-07-15 DIAGNOSIS — E7800 Pure hypercholesterolemia, unspecified: Secondary | ICD-10-CM | POA: Diagnosis not present

## 2021-07-15 DIAGNOSIS — D649 Anemia, unspecified: Secondary | ICD-10-CM | POA: Diagnosis not present

## 2021-07-16 ENCOUNTER — Ambulatory Visit
Admission: RE | Admit: 2021-07-16 | Discharge: 2021-07-16 | Disposition: A | Discharge status: Home / Self Care | Payer: Medicare PPO | Source: Ambulatory Visit | Attending: Obstetrics and Gynecology | Admitting: Obstetrics and Gynecology

## 2021-07-16 ENCOUNTER — Other Ambulatory Visit: Payer: Self-pay | Admitting: Obstetrics and Gynecology

## 2021-07-16 DIAGNOSIS — R928 Other abnormal and inconclusive findings on diagnostic imaging of breast: Secondary | ICD-10-CM

## 2021-07-16 DIAGNOSIS — R922 Inconclusive mammogram: Secondary | ICD-10-CM | POA: Diagnosis not present

## 2021-07-16 DIAGNOSIS — N6310 Unspecified lump in the right breast, unspecified quadrant: Secondary | ICD-10-CM

## 2021-07-20 DIAGNOSIS — J479 Bronchiectasis, uncomplicated: Secondary | ICD-10-CM | POA: Diagnosis not present

## 2021-07-20 DIAGNOSIS — R3129 Other microscopic hematuria: Secondary | ICD-10-CM | POA: Diagnosis not present

## 2021-07-20 DIAGNOSIS — I10 Essential (primary) hypertension: Secondary | ICD-10-CM | POA: Diagnosis not present

## 2021-07-20 DIAGNOSIS — R030 Elevated blood-pressure reading, without diagnosis of hypertension: Secondary | ICD-10-CM | POA: Diagnosis not present

## 2021-07-20 DIAGNOSIS — R8271 Bacteriuria: Secondary | ICD-10-CM | POA: Diagnosis not present

## 2021-07-20 DIAGNOSIS — Z0000 Encounter for general adult medical examination without abnormal findings: Secondary | ICD-10-CM | POA: Diagnosis not present

## 2021-07-20 DIAGNOSIS — M810 Age-related osteoporosis without current pathological fracture: Secondary | ICD-10-CM | POA: Diagnosis not present

## 2021-07-20 DIAGNOSIS — I712 Thoracic aortic aneurysm, without rupture, unspecified: Secondary | ICD-10-CM | POA: Diagnosis not present

## 2021-07-20 DIAGNOSIS — J309 Allergic rhinitis, unspecified: Secondary | ICD-10-CM | POA: Diagnosis not present

## 2021-08-12 ENCOUNTER — Ambulatory Visit
Admission: RE | Admit: 2021-08-12 | Discharge: 2021-08-12 | Disposition: A | Discharge status: Home / Self Care | Payer: Medicare PPO | Source: Ambulatory Visit | Attending: Obstetrics and Gynecology | Admitting: Obstetrics and Gynecology

## 2021-08-12 ENCOUNTER — Other Ambulatory Visit: Payer: Self-pay

## 2021-08-12 DIAGNOSIS — N6310 Unspecified lump in the right breast, unspecified quadrant: Secondary | ICD-10-CM

## 2021-08-12 DIAGNOSIS — N6315 Unspecified lump in the right breast, overlapping quadrants: Secondary | ICD-10-CM | POA: Diagnosis not present

## 2021-08-12 DIAGNOSIS — T8189XA Other complications of procedures, not elsewhere classified, initial encounter: Secondary | ICD-10-CM | POA: Diagnosis not present

## 2021-08-17 DIAGNOSIS — M1711 Unilateral primary osteoarthritis, right knee: Secondary | ICD-10-CM | POA: Diagnosis not present

## 2021-08-17 DIAGNOSIS — E7800 Pure hypercholesterolemia, unspecified: Secondary | ICD-10-CM | POA: Diagnosis not present

## 2021-08-18 ENCOUNTER — Other Ambulatory Visit: Payer: Self-pay | Admitting: Obstetrics and Gynecology

## 2021-08-18 DIAGNOSIS — N6489 Other specified disorders of breast: Secondary | ICD-10-CM

## 2021-08-21 ENCOUNTER — Other Ambulatory Visit: Payer: Self-pay

## 2021-08-21 ENCOUNTER — Ambulatory Visit
Admission: RE | Admit: 2021-08-21 | Discharge: 2021-08-21 | Disposition: A | Discharge status: Home / Self Care | Payer: Medicare PPO | Source: Ambulatory Visit | Attending: Obstetrics and Gynecology | Admitting: Obstetrics and Gynecology

## 2021-08-21 DIAGNOSIS — N641 Fat necrosis of breast: Secondary | ICD-10-CM | POA: Diagnosis not present

## 2021-08-21 DIAGNOSIS — R928 Other abnormal and inconclusive findings on diagnostic imaging of breast: Secondary | ICD-10-CM | POA: Diagnosis not present

## 2021-08-21 DIAGNOSIS — N6489 Other specified disorders of breast: Secondary | ICD-10-CM

## 2021-10-07 ENCOUNTER — Encounter: Payer: Self-pay | Admitting: Physician Assistant

## 2021-10-07 ENCOUNTER — Ambulatory Visit: Payer: Medicare PPO | Admitting: Physician Assistant

## 2021-10-07 ENCOUNTER — Other Ambulatory Visit: Payer: Self-pay

## 2021-10-07 DIAGNOSIS — D1800 Hemangioma unspecified site: Secondary | ICD-10-CM

## 2021-10-07 DIAGNOSIS — Z85828 Personal history of other malignant neoplasm of skin: Secondary | ICD-10-CM | POA: Diagnosis not present

## 2021-10-07 DIAGNOSIS — Z1283 Encounter for screening for malignant neoplasm of skin: Secondary | ICD-10-CM | POA: Diagnosis not present

## 2021-10-07 DIAGNOSIS — L820 Inflamed seborrheic keratosis: Secondary | ICD-10-CM | POA: Diagnosis not present

## 2021-10-08 DIAGNOSIS — Z803 Family history of malignant neoplasm of breast: Secondary | ICD-10-CM | POA: Diagnosis not present

## 2021-10-08 DIAGNOSIS — K219 Gastro-esophageal reflux disease without esophagitis: Secondary | ICD-10-CM | POA: Diagnosis not present

## 2021-10-08 DIAGNOSIS — G8929 Other chronic pain: Secondary | ICD-10-CM | POA: Diagnosis not present

## 2021-10-08 DIAGNOSIS — R32 Unspecified urinary incontinence: Secondary | ICD-10-CM | POA: Diagnosis not present

## 2021-10-08 DIAGNOSIS — Z87891 Personal history of nicotine dependence: Secondary | ICD-10-CM | POA: Diagnosis not present

## 2021-10-08 DIAGNOSIS — M1990 Unspecified osteoarthritis, unspecified site: Secondary | ICD-10-CM | POA: Diagnosis not present

## 2021-10-08 DIAGNOSIS — I10 Essential (primary) hypertension: Secondary | ICD-10-CM | POA: Diagnosis not present

## 2021-10-12 DIAGNOSIS — Z961 Presence of intraocular lens: Secondary | ICD-10-CM | POA: Diagnosis not present

## 2021-10-12 DIAGNOSIS — H40022 Open angle with borderline findings, high risk, left eye: Secondary | ICD-10-CM | POA: Diagnosis not present

## 2021-10-12 DIAGNOSIS — H02055 Trichiasis without entropian left lower eyelid: Secondary | ICD-10-CM | POA: Diagnosis not present

## 2021-10-12 DIAGNOSIS — H02052 Trichiasis without entropian right lower eyelid: Secondary | ICD-10-CM | POA: Diagnosis not present

## 2021-10-12 DIAGNOSIS — H401111 Primary open-angle glaucoma, right eye, mild stage: Secondary | ICD-10-CM | POA: Diagnosis not present

## 2021-10-19 ENCOUNTER — Encounter: Payer: Self-pay | Admitting: Physician Assistant

## 2021-10-19 NOTE — Progress Notes (Signed)
? ?  Follow-Up Visit ?  ?Subjective  ?Tina Bell is a 86 y.o. female who presents for the following: Annual Exam (No new concerns. No family h/o of melanoma or non mole skin cancer. Personal history of non mole skin cancer but no melanoma.). ? ? ?The following portions of the chart were reviewed this encounter and updated as appropriate:  Tobacco  Allergies  Meds  Problems  Med Hx  Surg Hx  Fam Hx   ?  ? ?Objective  ?Well appearing patient in no apparent distress; mood and affect are within normal limits. ? ?A full examination was performed including scalp, head, eyes, ears, nose, lips, neck, chest, axillae, abdomen, back, buttocks, bilateral upper extremities, bilateral lower extremities, hands, feet, fingers, toes, fingernails, and toenails. All findings within normal limits unless otherwise noted below. ? ?head to toe ?No atypical nevi or signs of NMSC noted at the time of the visit.  ? ?Left Lower Leg - Anterior (2), Left Thigh - Anterior (2), Right Forearm - Anterior (4), Right Lower Leg - Anterior, Right Supraclavicular Area (2), Right Thigh - Anterior (2) ?Stuck-on, waxy, tan-brown papules and plaques. --Discussed benign etiology and prognosis.  ? ? ?Assessment & Plan  ?Encounter for screening for malignant neoplasm of skin ?head to toe ? ?Yearly skin examination  ? ?Hemangioma, unspecified site ?Right Malar Cheek ? ?Okay to leave if stable ? ?Seborrheic keratosis, inflamed (13) ?Right Forearm - Anterior (4); Left Thigh - Anterior (2); Right Thigh - Anterior (2); Left Lower Leg - Anterior (2); Right Lower Leg - Anterior; Right Supraclavicular Area (2) ? ?Destruction of lesion - Left Lower Leg - Anterior, Left Thigh - Anterior, Right Forearm - Anterior, Right Lower Leg - Anterior, Right Supraclavicular Area, Right Thigh - Anterior ?Complexity: simple   ?Destruction method: cryotherapy   ?Informed consent: discussed and consent obtained   ?Timeout:  patient name, date of birth, surgical site, and  procedure verified ?Lesion destroyed using liquid nitrogen: Yes   ?Cryotherapy cycles:  3 ?Outcome: patient tolerated procedure well with no complications   ? ?No atypical nevi noted at the time of the visit. ? ?I, Lanyiah Brix, PA-C, have reviewed all documentation's for this visit.  The documentation on 10/19/21 for the exam, diagnosis, procedures and orders are all accurate and complete. ?

## 2022-03-08 DIAGNOSIS — M810 Age-related osteoporosis without current pathological fracture: Secondary | ICD-10-CM | POA: Diagnosis not present

## 2022-03-17 DIAGNOSIS — M1711 Unilateral primary osteoarthritis, right knee: Secondary | ICD-10-CM | POA: Diagnosis not present

## 2022-03-31 DIAGNOSIS — H40022 Open angle with borderline findings, high risk, left eye: Secondary | ICD-10-CM | POA: Diagnosis not present

## 2022-03-31 DIAGNOSIS — H02055 Trichiasis without entropian left lower eyelid: Secondary | ICD-10-CM | POA: Diagnosis not present

## 2022-03-31 DIAGNOSIS — H401111 Primary open-angle glaucoma, right eye, mild stage: Secondary | ICD-10-CM | POA: Diagnosis not present

## 2022-03-31 DIAGNOSIS — H02052 Trichiasis without entropian right lower eyelid: Secondary | ICD-10-CM | POA: Diagnosis not present

## 2022-03-31 DIAGNOSIS — Z961 Presence of intraocular lens: Secondary | ICD-10-CM | POA: Diagnosis not present

## 2022-04-08 DIAGNOSIS — I712 Thoracic aortic aneurysm, without rupture, unspecified: Secondary | ICD-10-CM | POA: Diagnosis not present

## 2022-04-08 DIAGNOSIS — I10 Essential (primary) hypertension: Secondary | ICD-10-CM | POA: Diagnosis not present

## 2022-04-14 ENCOUNTER — Ambulatory Visit: Payer: Medicare PPO | Admitting: Physician Assistant

## 2022-04-20 DIAGNOSIS — R197 Diarrhea, unspecified: Secondary | ICD-10-CM | POA: Diagnosis not present

## 2022-04-20 DIAGNOSIS — R110 Nausea: Secondary | ICD-10-CM | POA: Diagnosis not present

## 2022-04-27 DIAGNOSIS — Z85828 Personal history of other malignant neoplasm of skin: Secondary | ICD-10-CM | POA: Diagnosis not present

## 2022-04-27 DIAGNOSIS — L814 Other melanin hyperpigmentation: Secondary | ICD-10-CM | POA: Diagnosis not present

## 2022-04-27 DIAGNOSIS — L820 Inflamed seborrheic keratosis: Secondary | ICD-10-CM | POA: Diagnosis not present

## 2022-04-27 DIAGNOSIS — L821 Other seborrheic keratosis: Secondary | ICD-10-CM | POA: Diagnosis not present

## 2022-04-27 DIAGNOSIS — L570 Actinic keratosis: Secondary | ICD-10-CM | POA: Diagnosis not present

## 2022-05-05 DIAGNOSIS — N3281 Overactive bladder: Secondary | ICD-10-CM | POA: Diagnosis not present

## 2022-06-16 DIAGNOSIS — Z6821 Body mass index (BMI) 21.0-21.9, adult: Secondary | ICD-10-CM | POA: Diagnosis not present

## 2022-06-16 DIAGNOSIS — N841 Polyp of cervix uteri: Secondary | ICD-10-CM | POA: Diagnosis not present

## 2022-06-16 DIAGNOSIS — Z01419 Encounter for gynecological examination (general) (routine) without abnormal findings: Secondary | ICD-10-CM | POA: Diagnosis not present

## 2022-06-16 DIAGNOSIS — N952 Postmenopausal atrophic vaginitis: Secondary | ICD-10-CM | POA: Diagnosis not present

## 2022-06-29 DIAGNOSIS — L570 Actinic keratosis: Secondary | ICD-10-CM | POA: Diagnosis not present

## 2022-06-29 DIAGNOSIS — D1801 Hemangioma of skin and subcutaneous tissue: Secondary | ICD-10-CM | POA: Diagnosis not present

## 2022-06-29 DIAGNOSIS — Z85528 Personal history of other malignant neoplasm of kidney: Secondary | ICD-10-CM | POA: Diagnosis not present

## 2022-06-29 DIAGNOSIS — L821 Other seborrheic keratosis: Secondary | ICD-10-CM | POA: Diagnosis not present

## 2022-06-29 DIAGNOSIS — L820 Inflamed seborrheic keratosis: Secondary | ICD-10-CM | POA: Diagnosis not present

## 2022-06-29 DIAGNOSIS — L814 Other melanin hyperpigmentation: Secondary | ICD-10-CM | POA: Diagnosis not present

## 2022-07-06 DIAGNOSIS — M1711 Unilateral primary osteoarthritis, right knee: Secondary | ICD-10-CM | POA: Diagnosis not present

## 2022-07-19 DIAGNOSIS — E7800 Pure hypercholesterolemia, unspecified: Secondary | ICD-10-CM | POA: Diagnosis not present

## 2022-07-19 DIAGNOSIS — M810 Age-related osteoporosis without current pathological fracture: Secondary | ICD-10-CM | POA: Diagnosis not present

## 2022-07-22 ENCOUNTER — Other Ambulatory Visit: Payer: Self-pay | Admitting: Internal Medicine

## 2022-07-22 DIAGNOSIS — R413 Other amnesia: Secondary | ICD-10-CM | POA: Diagnosis not present

## 2022-07-22 DIAGNOSIS — K2100 Gastro-esophageal reflux disease with esophagitis, without bleeding: Secondary | ICD-10-CM | POA: Diagnosis not present

## 2022-07-22 DIAGNOSIS — N3281 Overactive bladder: Secondary | ICD-10-CM | POA: Diagnosis not present

## 2022-07-22 DIAGNOSIS — I712 Thoracic aortic aneurysm, without rupture, unspecified: Secondary | ICD-10-CM | POA: Diagnosis not present

## 2022-07-22 DIAGNOSIS — J309 Allergic rhinitis, unspecified: Secondary | ICD-10-CM | POA: Diagnosis not present

## 2022-07-22 DIAGNOSIS — I10 Essential (primary) hypertension: Secondary | ICD-10-CM

## 2022-07-22 DIAGNOSIS — R971 Elevated cancer antigen 125 [CA 125]: Secondary | ICD-10-CM | POA: Diagnosis not present

## 2022-07-22 DIAGNOSIS — Z0000 Encounter for general adult medical examination without abnormal findings: Secondary | ICD-10-CM | POA: Diagnosis not present

## 2022-07-22 DIAGNOSIS — Z23 Encounter for immunization: Secondary | ICD-10-CM | POA: Diagnosis not present

## 2022-07-22 DIAGNOSIS — Z803 Family history of malignant neoplasm of breast: Secondary | ICD-10-CM | POA: Diagnosis not present

## 2022-07-22 DIAGNOSIS — M810 Age-related osteoporosis without current pathological fracture: Secondary | ICD-10-CM | POA: Diagnosis not present

## 2022-08-18 DIAGNOSIS — Z961 Presence of intraocular lens: Secondary | ICD-10-CM | POA: Diagnosis not present

## 2022-08-18 DIAGNOSIS — H401111 Primary open-angle glaucoma, right eye, mild stage: Secondary | ICD-10-CM | POA: Diagnosis not present

## 2022-08-18 DIAGNOSIS — H02052 Trichiasis without entropian right lower eyelid: Secondary | ICD-10-CM | POA: Diagnosis not present

## 2022-08-18 DIAGNOSIS — H02055 Trichiasis without entropian left lower eyelid: Secondary | ICD-10-CM | POA: Diagnosis not present

## 2022-08-18 DIAGNOSIS — H40022 Open angle with borderline findings, high risk, left eye: Secondary | ICD-10-CM | POA: Diagnosis not present

## 2022-08-23 DIAGNOSIS — R971 Elevated cancer antigen 125 [CA 125]: Secondary | ICD-10-CM | POA: Diagnosis not present

## 2022-08-30 ENCOUNTER — Encounter: Payer: Self-pay | Admitting: Internal Medicine

## 2022-08-31 ENCOUNTER — Ambulatory Visit
Admission: RE | Admit: 2022-08-31 | Discharge: 2022-08-31 | Disposition: A | Discharge status: Home / Self Care | Payer: Medicare PPO | Source: Ambulatory Visit | Attending: Internal Medicine | Admitting: Internal Medicine

## 2022-08-31 DIAGNOSIS — I700 Atherosclerosis of aorta: Secondary | ICD-10-CM | POA: Diagnosis not present

## 2022-08-31 DIAGNOSIS — I712 Thoracic aortic aneurysm, without rupture, unspecified: Secondary | ICD-10-CM

## 2022-08-31 DIAGNOSIS — R918 Other nonspecific abnormal finding of lung field: Secondary | ICD-10-CM | POA: Diagnosis not present

## 2022-08-31 DIAGNOSIS — I10 Essential (primary) hypertension: Secondary | ICD-10-CM

## 2022-08-31 MED ORDER — IOPAMIDOL (ISOVUE-370) INJECTION 76%
75.0000 mL | Freq: Once | INTRAVENOUS | Status: AC | PRN
  Administered 2022-08-31: 75 mL via INTRAVENOUS

## 2022-09-01 DIAGNOSIS — Z23 Encounter for immunization: Secondary | ICD-10-CM | POA: Diagnosis not present

## 2022-09-07 ENCOUNTER — Other Ambulatory Visit: Payer: Self-pay

## 2022-09-07 ENCOUNTER — Emergency Department (HOSPITAL_BASED_OUTPATIENT_CLINIC_OR_DEPARTMENT_OTHER)
Admission: EM | Admit: 2022-09-07 | Discharge: 2022-09-07 | Disposition: A | Discharge status: Home / Self Care | Payer: Medicare PPO | Attending: Emergency Medicine | Admitting: Emergency Medicine

## 2022-09-07 ENCOUNTER — Emergency Department (HOSPITAL_BASED_OUTPATIENT_CLINIC_OR_DEPARTMENT_OTHER): Payer: Medicare PPO

## 2022-09-07 ENCOUNTER — Encounter (HOSPITAL_BASED_OUTPATIENT_CLINIC_OR_DEPARTMENT_OTHER): Payer: Self-pay

## 2022-09-07 DIAGNOSIS — R634 Abnormal weight loss: Secondary | ICD-10-CM | POA: Diagnosis not present

## 2022-09-07 DIAGNOSIS — N3001 Acute cystitis with hematuria: Secondary | ICD-10-CM | POA: Insufficient documentation

## 2022-09-07 DIAGNOSIS — I498 Other specified cardiac arrhythmias: Secondary | ICD-10-CM | POA: Insufficient documentation

## 2022-09-07 DIAGNOSIS — R410 Disorientation, unspecified: Secondary | ICD-10-CM | POA: Diagnosis not present

## 2022-09-07 DIAGNOSIS — Z1152 Encounter for screening for COVID-19: Secondary | ICD-10-CM | POA: Diagnosis not present

## 2022-09-07 DIAGNOSIS — Z87891 Personal history of nicotine dependence: Secondary | ICD-10-CM | POA: Insufficient documentation

## 2022-09-07 DIAGNOSIS — I959 Hypotension, unspecified: Secondary | ICD-10-CM | POA: Diagnosis not present

## 2022-09-07 DIAGNOSIS — Z8249 Family history of ischemic heart disease and other diseases of the circulatory system: Secondary | ICD-10-CM | POA: Insufficient documentation

## 2022-09-07 DIAGNOSIS — R110 Nausea: Secondary | ICD-10-CM | POA: Diagnosis not present

## 2022-09-07 DIAGNOSIS — R531 Weakness: Secondary | ICD-10-CM | POA: Diagnosis not present

## 2022-09-07 DIAGNOSIS — I517 Cardiomegaly: Secondary | ICD-10-CM | POA: Insufficient documentation

## 2022-09-07 DIAGNOSIS — N179 Acute kidney failure, unspecified: Secondary | ICD-10-CM | POA: Diagnosis not present

## 2022-09-07 DIAGNOSIS — J168 Pneumonia due to other specified infectious organisms: Secondary | ICD-10-CM | POA: Diagnosis not present

## 2022-09-07 DIAGNOSIS — Z79899 Other long term (current) drug therapy: Secondary | ICD-10-CM | POA: Insufficient documentation

## 2022-09-07 DIAGNOSIS — G9341 Metabolic encephalopathy: Secondary | ICD-10-CM | POA: Diagnosis not present

## 2022-09-07 DIAGNOSIS — F0392 Unspecified dementia, unspecified severity, with psychotic disturbance: Secondary | ICD-10-CM | POA: Insufficient documentation

## 2022-09-07 DIAGNOSIS — Z87440 Personal history of urinary (tract) infections: Secondary | ICD-10-CM | POA: Insufficient documentation

## 2022-09-07 DIAGNOSIS — J189 Pneumonia, unspecified organism: Secondary | ICD-10-CM

## 2022-09-07 LAB — CBC
HCT: 40.9 % (ref 36.0–46.0)
Hemoglobin: 13.4 g/dL (ref 12.0–15.0)
MCH: 28.8 pg (ref 26.0–34.0)
MCHC: 32.8 g/dL (ref 30.0–36.0)
MCV: 88 fL (ref 80.0–100.0)
Platelets: 329 10*3/uL (ref 150–400)
RBC: 4.65 MIL/uL (ref 3.87–5.11)
RDW: 13.8 % (ref 11.5–15.5)
WBC: 16.5 10*3/uL — ABNORMAL HIGH (ref 4.0–10.5)
nRBC: 0 % (ref 0.0–0.2)

## 2022-09-07 LAB — URINALYSIS, ROUTINE W REFLEX MICROSCOPIC
Bilirubin Urine: NEGATIVE
Glucose, UA: NEGATIVE mg/dL
Hgb urine dipstick: NEGATIVE
Ketones, ur: NEGATIVE mg/dL
Nitrite: NEGATIVE
Protein, ur: 30 mg/dL — AB
Specific Gravity, Urine: 1.025 (ref 1.005–1.030)
pH: 5.5 (ref 5.0–8.0)

## 2022-09-07 LAB — TROPONIN I (HIGH SENSITIVITY): Troponin I (High Sensitivity): 5 ng/L (ref <18)

## 2022-09-07 LAB — BASIC METABOLIC PANEL
Anion gap: 11 (ref 5–15)
BUN: 40 mg/dL — ABNORMAL HIGH (ref 8–23)
CO2: 25 mmol/L (ref 22–32)
Calcium: 9.7 mg/dL (ref 8.9–10.3)
Chloride: 101 mmol/L (ref 98–111)
Creatinine, Ser: 1.08 mg/dL — ABNORMAL HIGH (ref 0.44–1.00)
GFR, Estimated: 51 mL/min — ABNORMAL LOW (ref >60)
Glucose, Bld: 110 mg/dL — ABNORMAL HIGH (ref 70–99)
Potassium: 4.2 mmol/L (ref 3.5–5.1)
Sodium: 137 mmol/L (ref 135–145)

## 2022-09-07 LAB — RESP PANEL BY RT-PCR (RSV, FLU A&B, COVID)  RVPGX2
Influenza A by PCR: NEGATIVE
Influenza B by PCR: NEGATIVE
Resp Syncytial Virus by PCR: NEGATIVE
SARS Coronavirus 2 by RT PCR: NEGATIVE

## 2022-09-07 MED ORDER — SODIUM CHLORIDE 0.9 % IV BOLUS
500.0000 mL | Freq: Once | INTRAVENOUS | Status: AC
  Administered 2022-09-07: 500 mL via INTRAVENOUS

## 2022-09-07 MED ORDER — CEFDINIR 300 MG PO CAPS: 300.0000 mg | ORAL_CAPSULE | Freq: Two times a day (BID) | ORAL | 0 refills | Status: AC

## 2022-09-07 MED ORDER — SODIUM CHLORIDE 0.9 % IV SOLN
1.0000 g | Freq: Once | INTRAVENOUS | Status: AC
  Administered 2022-09-07: 1 g via INTRAVENOUS
  Filled 2022-09-07: qty 10

## 2022-09-07 NOTE — ED Provider Notes (Signed)
I was called to bedside as patient is requesting discharge. She is an 87 year old female who is currently here with a UTI and altered mental status recommended for admission by prior provider.  Was evaluated by hospitalist who overall felt patient looked well. X-ray was pending at this time.  It appears to have a left lobar infiltrate and patient is tachypneic and hypoxic on ambulation trial.  I approached the patient at bedside. I recommended strongly for admission given dyspnea, left lobar infiltrate, hypoxia on ambulation trial and patient stated she does not want to stay in the hospital that she would rather continue with outpatient care and management.  She expressed understanding of risk of progression of disease including sepsis and death but continues to decline hospital admission at this time.  Given patient's understanding of risk, full discussion with patient and husband at bedside, patient's demonstration of capacity: Patient will be discharged according to her wishes at this time with strict return precautions if they change their mind guarding admission at any time.  Disposition:  I have recommended hospitalization, however, considering all of the above, I will discharge the patient according to their wishes.  Patient/family educated about specific return precautions for given chief complaint and symptoms.  Patient/family educated about follow-up with PCP.     Patient/family expressed understanding of return precautions and need for follow-up. Patient spoken to regarding all imaging and laboratory results and appropriate follow up for these results. All education provided in verbal form with additional information in written form. Time was allowed for answering of patient questions. Patient discharged.    Emergency Department Medication Summary:   Medications  sodium chloride 0.9 % bolus 500 mL (0 mLs Intravenous Stopped 09/07/22 1622)  cefTRIAXone (ROCEPHIN) 1 g in sodium chloride 0.9 %  100 mL IVPB (0 g Intravenous Stopped 09/07/22 1622)  sodium chloride 0.9 % bolus 500 mL (0 mLs Intravenous Stopped 09/07/22 1622)          Tretha Sciara, MD 09/07/22 4665

## 2022-09-07 NOTE — ED Notes (Signed)
Discharge instructions reviewed.  Patient understands if gets worse to come back to ER.

## 2022-09-07 NOTE — ED Provider Notes (Addendum)
[facility] Provider Note   CSN: [id number] Arrival date & time: 09/07/22  1226     History  Chief Complaint  Patient presents with   Weakness    Tina Bell is a 86 y.o. female.  Patient presents from assisted living with significant other due to general weakness and no appetite since Friday.  Patient slept almost 20 hours in a row which is atypical for her.  Patient had a few hallucinations since Friday.  No unilateral weakness or numbness, no stroke symptoms.  Patient was lightheaded and blood pressure was in the 70s prior to arrival from primary care office.  No recent fevers documented.  No chest pain or shortness of breath.  Patient's had UTI in the past.       Home Medications Prior to Admission medications   Medication Sig Start Date End Date Taking? Authorizing Provider  alendronate (FOSAMAX) 70 MG tablet Take 70 mg by mouth once a week. Take with a full glass of water on an empty stomach. On firday    [provider]  Calcium Carbonate-Vit D-Min (CALCIUM 1200 PO) Take 1 capsule by mouth daily.    [provider]  calcium-vitamin D (CAL-CITRATE PLUS VITAMIN D) 250-100 MG-UNIT tablet Take by mouth.    [provider]  dorzolamide (TRUSOPT) 2 % ophthalmic solution Place 1 drop into both eyes daily.    [provider]  GEMTESA 75 MG TABS Take 1 tablet by mouth daily. 08/23/22   [provider]  losartan (COZAAR) 50 MG tablet 0.5 tablet    [provider]  omeprazole (PRILOSEC) 40 MG capsule  12/08/19   [provider]  rosuvastatin (CRESTOR) 10 MG tablet Take 10 mg by mouth at bedtime. 05/31/22   [provider]  UNABLE TO FIND Vitamin d 3 100 iu daily    [provider]  valsartan (DIOVAN) 320 MG tablet Take 320 mg by mouth daily. 06/24/22   [provider]      Allergies    Levaquin [levofloxacin], Codeine, Corticotropin, Keflex  [cephalexin], Morphine and related, and Macrodantin [nitrofurantoin macrocrystal]    Review of Systems   Review of Systems  Constitutional:  Positive for fatigue. Negative for chills and fever.  HENT:  Negative for congestion.   Eyes:  Negative for visual disturbance.  Respiratory:  Negative for shortness of breath.   Cardiovascular:  Negative for chest pain.  Gastrointestinal:  Negative for abdominal pain and vomiting.  Genitourinary:  Negative for dysuria and flank pain.  Musculoskeletal:  Negative for back pain, neck pain and neck stiffness.  Skin:  Negative for rash.  Neurological:  Positive for light-headedness. Negative for headaches.  Psychiatric/Behavioral:  Positive for hallucinations.     Physical Exam Updated Vital Signs BP 108/67   Pulse 91   Temp 98.9 F (37.2 C)   Resp (!) 28   Ht 5' 2.5" (1.588 m)   Wt 53.5 kg   SpO2 93%   BMI 21.23 kg/m  Physical Exam Vitals and nursing note reviewed.  Constitutional:      General: She is not in acute distress.    Appearance: She is well-developed.  HENT:     Head: Normocephalic and atraumatic.     Mouth/Throat:     Mouth: Mucous membranes are dry.  Eyes:     General:        Right eye: No discharge.        Left eye: No discharge.  Conjunctiva/sclera: Conjunctivae normal.  Neck:     Trachea: No tracheal deviation.  Cardiovascular:     Rate and Rhythm: Normal rate and regular rhythm.  Pulmonary:     Effort: Pulmonary effort is normal.     Breath sounds: Normal breath sounds.  Abdominal:     General: There is no distension.     Palpations: Abdomen is soft.     Tenderness: There is no abdominal tenderness. There is no guarding.  Musculoskeletal:        General: No swelling.     Cervical back: Normal range of motion and neck supple. No rigidity.  Skin:    General: Skin is warm.     Capillary Refill: Capillary refill takes less than 2 seconds.     Findings: No rash.  Neurological:     General: No focal  deficit present.     Mental Status: She is alert.     Cranial Nerves: No cranial nerve deficit.     Sensory: No sensory deficit.     Comments: Patient has equal strength against gravity in all extremities.  General fatigue appearance.  Psychiatric:        Mood and Affect: Mood normal.     ED Results / Procedures / Treatments   Labs (all labs ordered are listed, but only abnormal results are displayed) Labs Reviewed  BASIC METABOLIC PANEL - Abnormal; Notable for the following components:      Result Value   Glucose, Bld 110 (*)    BUN 40 (*)    Creatinine, Ser 1.08 (*)    GFR, Estimated 51 (*)    All other components within normal limits  CBC - Abnormal; Notable for the following components:   WBC 16.5 (*)    All other components within normal limits  URINALYSIS, ROUTINE W REFLEX MICROSCOPIC - Abnormal; Notable for the following components:   Protein, ur 30 (*)    Leukocytes,Ua SMALL (*)    Bacteria, UA RARE (*)    All other components within normal limits  RESP PANEL BY RT-PCR (RSV, FLU A&B, COVID)  RVPGX2  TROPONIN I (HIGH SENSITIVITY)    EKG EKG Interpretation  Date/Time:  Tuesday September 07 2022 12:43:20 EST Ventricular Rate:  85 PR Interval:  140 QRS Duration: 90 QT Interval:  390 QTC Calculation: 464 R Axis:   -55 Text Interpretation: Normal sinus rhythm with sinus arrhythmia Interpretation limited secondary to artifact Possible Left atrial enlargement Left anterior fascicular block Minimal voltage criteria for LVH, may be normal variant ( Cornell product ) Nonspecific T wave abnormality Abnormal ECG No previous ECGs available Confirmed by Elnora Morrison [phone]) on 09/07/2022 1:06:43 PM  Radiology No results found.  Procedures .Critical Care  Performed by: Elnora Morrison, MD Authorized by: Elnora Morrison, MD   Critical care provider statement:    Critical care time (minutes):  30   Critical care start time:  09/07/2022 2:00 PM   Critical care end time:   09/07/2022 2:30 PM   Critical care time was exclusive of:  Separately billable procedures and treating other patients and teaching time   Critical care was necessary to treat or prevent imminent or life-threatening deterioration of the following conditions:  Dehydration   Critical care was time spent personally by me on the following activities:  Development of treatment plan with patient or surrogate, evaluation of patient's response to treatment, examination of patient, ordering and review of laboratory studies, ordering and review of radiographic studies, ordering and performing treatments  and interventions, pulse oximetry, re-evaluation of patient's condition and review of old charts     Medications Ordered in ED Medications  sodium chloride 0.9 % bolus 500 mL (500 mLs Intravenous New Bag/Given 09/07/22 1339)  cefTRIAXone (ROCEPHIN) 1 g in sodium chloride 0.9 % 100 mL IVPB (1 g Intravenous New Bag/Given 09/07/22 1410)  sodium chloride 0.9 % bolus 500 mL (500 mLs Intravenous New Bag/Given 09/07/22 1405)    ED Course/ Medical Decision Making/ A&P                             Medical Decision Making Amount and/or Complexity of Data Reviewed Labs: ordered. Radiology: ordered.  Risk Decision regarding hospitalization.   Patient presents with general weakness and low blood pressure differential including dehydration, renal failure secondary to UTI/pneumonia.  Other differentials include metabolic/anemia/thyroid related.  No stroke signs on exam.  Afebrile and blood pressure responded to IV fluid bolus given 1 L. With nausea and weakness viral testing sent to check for influenza/ covid.  Blood work independently reviewed did show leukocytosis 16,000.  Urinalysis independently reviewed small leukocytes, possible site of infection versus pneumonia.  Discussed allergy to cephalosporin patient not aware of any significant allergy, Rocephin ordered.  Chest x-ray reviewed independently possible  infiltrate left lower radiology read pending.  Plan to observe/admit overnight due to acute renal failure/transient hypotension/concern for UTI.  Paged hospitalist.  Spoke with Dr. Lorin Mercy for admission.        Final Clinical Impression(s) / ED Diagnoses Final diagnoses:  Weakness  Acute renal failure, unspecified acute renal failure type ([place])  Acute cystitis with hematuria    Rx / DC Orders ED Discharge Orders     None         Elnora Morrison, MD 09/07/22 1540    Elnora Morrison, MD 10/20/22 [phone]

## 2022-09-07 NOTE — Assessment & Plan Note (Signed)
-  Patient with reported h/o dementia presented with marked lethargy, hallucinations -She was seen by her PCP with concern for hypovolemia -She was mildly hypotensive in the ER and this was volume responsive -Minimal renal dysfunction but not significantly different from prior -Urine actually looks minimally abnormal -Suggest urine culture without treatment for now -Based on marked improvement of patient and comfort level with her and her husband about going home, I recommend dc to home with ongoing outpatient f/u

## 2022-09-07 NOTE — Progress Notes (Signed)
RT assessed the Pt and her O2 was 91% on RA. Rt tried 2 different pulse ox and her SATS were 90-93%. The Pt's left upper lung had crackle/rhonchi. Rt asked the Pt to cough and her lung cleared. RT will contonue to monitor

## 2022-09-07 NOTE — ED Notes (Signed)
Kimberly from [facility] called to ck status. Advised our provider was waiting on Xray results before making the decision to cancel Bed Request/send patient home. She will let Bed Placement know to cancel request because the patient is being released to go home.-ABB(NS)

## 2022-09-07 NOTE — ED Notes (Signed)
Pt alert and oriented x4 

## 2022-09-07 NOTE — ED Notes (Signed)
RT notified and responded to bedside

## 2022-09-07 NOTE — ED Notes (Signed)
Thomas at [facility] will sent transport for patient in 30-45 mins. Patient has Bed Ready [facility] Rm#[id number].-ABB(NS)

## 2022-09-07 NOTE — ED Triage Notes (Signed)
Patient here POV from Home PCP Office.  Endorses having No Appetite. States since Friday she has been more fatigued. Significant Other also notes hallucinations since then.   No Pain. No Pain.   Sent by PCP for further assessment due to Weight Loss, Confusion, and Hypotension of 70/50.  NAD Noted during Triage. A&Ox4. GCS 15. BIB Wheelchair.

## 2022-09-07 NOTE — Consult Note (Signed)
Initial Consultation Note - televisit conducted with assistance from RN at [facility]    Patient: Tina Bell:[id number] DOB: 1936/03/23 PCP: Deland Pretty, MD DOA: 09/07/2022 DOS: the patient was seen and examined on 09/07/2022 Primary service: Karmen Bongo, MD  Referring physician: Reather Converse Reason for consult: Here from ALF.  AKI, UTI, lethargy - slept all weekend.  Has h/o UTIs, no urinary symptoms.  BP 70, improved with IVF.   Current BP is 108/67.  Prefers admission to [facility].     Assessment and Plan: * Acute metabolic encephalopathy -Patient with reported h/o dementia presented with marked lethargy, hallucinations -She was seen by her PCP with concern for hypovolemia -She was mildly hypotensive in the ER and this was volume responsive -Minimal renal dysfunction but not significantly different from prior -Urine actually looks minimally abnormal -Suggest urine culture without treatment for now -Based on marked improvement of patient and comfort level with her and her husband about going home, I recommend dc to home with ongoing outpatient f/u     TRH will sign off at present, please call us again when needed.  HPI: Tina Bell is a 87 y.o. female with past medical history of dementia and TAA presenting with generalized weakness.  The patient and her husband report that from Friday to today she had no energy, was in the bed all the time, hallucinating.  They saw the PA at her PCP and was encouraged to come in for IVF.  She has been in the bed from Friday through Monday.     Review of Systems: As mentioned in the history of present illness. All other systems reviewed and are negative. Past Medical History:  Diagnosis Date   Arthritis    knees   Borderline glaucoma    right eye   Bronchiectasis ([place])    pulmologist-  dr [place] Bells--   (  per note secondary to severe pertussis as child)   stable per LOV note 10-03-2015   Dementia ([place])    H/O pleural empyema    w/ CAP   June 2016  s/p   VAT's with thoracotomy drainage empyema and decortication right lower lobe   Hiatal hernia    History of kidney stones 3149,7026   History of recurrent UTIs    none lately   Pneumonia    hx of 2017 , 2000   PONV (postoperative nausea and vomiting)    and difficulty waking up   SCC (squamous cell carcinoma) 11/02/2013   SCC KA Left Cheek   SCC (squamous cell carcinoma) 12/03/2014   SCC KA Left Ant. Cheek   SCC (squamous cell carcinoma) 04/07/2018   SCC In Situ Left Post Knee   SCC (squamous cell carcinoma) 07/26/2018   SCC In Situ Left Hand    Thoracic ascending aortic aneurysm (HCC)    4.1 cm   per last ct 04-05-18 lov dr Lucianne Lei tright 04-05-18 epic   Past Surgical History:  Procedure Laterality Date   BREAST EXCISIONAL BIOPSY Right    CATARACT EXTRACTION W/ INTRAOCULAR LENS IMPLANT Bilateral 10/2015 and 2018   CYSTOSCOPY WITH URETEROSCOPY AND STENT PLACEMENT Left 11/07/2015   Procedure: CYSTOSCOPY LEFT URETEROSCOPY WITH LEFT RETROGRADE PYELOGRAM;  Surgeon: Festus Aloe, MD;  Location: [facility];  Service: Urology;  Laterality: Left;   CYSTOSCOPY/URETEROSCOPY/HOLMIUM LASER/STENT PLACEMENT Left 11/28/2015   Procedure: CYSTOSCOPY LEFT /URETEROSCOPY/HOLMIUM LASER/STENT REPLACEMENT;  Surgeon: Festus Aloe, MD;  Location: [facility];  Service: Urology;  Laterality: Left;   DILATION AND CURETTAGE OF  UTERUS  x3  1980's   EXTRACORPOREAL SHOCK WAVE LITHOTRIPSY  07-09-2013   EXTRACORPOREAL SHOCK WAVE LITHOTRIPSY Left 12/07/2018   Procedure: EXTRACORPOREAL SHOCK WAVE LITHOTRIPSY (ESWL);  Surgeon: Raynelle Bring, MD;  Location: [facility];  Service: Urology;  Laterality: Left;   HOLMIUM LASER APPLICATION Left 2/68/3419   Procedure: LEFT STENT PLACEMENT;  Surgeon: Festus Aloe, MD;  Location: [facility];  Service: Urology;  Laterality: Left;   HOLMIUM LASER APPLICATION Left 01/28/2978   Procedure: HOLMIUM LASER APPLICATION;  Surgeon: Festus Aloe, MD;  Location:  [facility];  Service: Urology;  Laterality: Left;   KNEE ARTHROSCOPY Right 1999   NASAL SEPTUM SURGERY  1989   PUBOVAGINAL SLING  1998   bone anchor   STONE EXTRACTION WITH BASKET Left 11/28/2015   Procedure: STONE EXTRACTION WITH BASKET;  Surgeon: Festus Aloe, MD;  Location: [facility];  Service: Urology;  Laterality: Left;   TOTAL KNEE ARTHROPLASTY Left 05/12/2015   Procedure: LEFT TOTAL KNEE ARTHROPLASTY;  Surgeon: Gaynelle Arabian, MD;  Location: [facility];  Service: Orthopedics;  Laterality: Left;   TUBAL LIGATION  1974   VIDEO ASSISTED THORACOSCOPY (VATS)/EMPYEMA Right 01/06/2015   Procedure: VIDEO ASSISTED THORACOSCOPY (VATS)/EMPYEMA AND DRAINAGE OF EMPYEMA;  Surgeon: Ivin Poot, MD;  Location: [facility];  Service: Thoracic;  Laterality: Right;   Social History:  reports that she quit smoking about 60 years ago. Her smoking use included cigarettes. She has a 1.25 pack-year smoking history. She has never used smokeless tobacco. She reports current alcohol use. She reports that she does not use drugs.  Allergies  Allergen Reactions   Levaquin [Levofloxacin]     Patient has history of fusiform thoracic ascending aneurysm 4.0 cm, hives   Codeine Other (See Comments)    Hallucination,hives    Corticotropin Other (See Comments)   Keflex [Cephalexin] Hives    Tolerated Augmentin   Morphine And Related Hives   Macrodantin [Nitrofurantoin Macrocrystal] Rash    Do not take    Family History  Problem Relation Age of Onset   Cancer Mother 42       ovarian; d. 37   Cancer Sister        breast   Schizophrenia Sister    Breast cancer Sister    Stomach cancer Paternal Uncle    Stomach cancer Maternal Grandmother 54   Head & neck cancer Father 60       jaw cancer, possibly from cigar smoking   Heart attack Maternal Grandfather    Other Sister        neg BRCA testing    Prior to Admission medications   Medication Sig Start Date End Date Taking?  Authorizing Provider  alendronate (FOSAMAX) 70 MG tablet Take 70 mg by mouth once a week. Take with a full glass of water on an empty stomach. On firday    [provider]  Calcium Carbonate-Vit D-Min (CALCIUM 1200 PO) Take 1 capsule by mouth daily.    [provider]  calcium-vitamin D (CAL-CITRATE PLUS VITAMIN D) 250-100 MG-UNIT tablet Take by mouth.    [provider]  dorzolamide (TRUSOPT) 2 % ophthalmic solution Place 1 drop into both eyes daily.    [provider]  GEMTESA 75 MG TABS Take 1 tablet by mouth daily. 08/23/22   [provider]  latanoprost (XALATAN) 0.005 % ophthalmic solution Place 1 drop into both eyes at bedtime.    [provider]  losartan (COZAAR) 50 MG tablet 0.5  tablet    [provider]  omeprazole (PRILOSEC) 40 MG capsule  12/08/19   [provider]  rosuvastatin (CRESTOR) 10 MG tablet Take 10 mg by mouth at bedtime. 05/31/22   [provider]  tamsulosin (FLOMAX) 0.4 MG CAPS capsule  12/07/18   [provider]  UNABLE TO FIND Vitamin d 3 100 iu daily    [provider]  valsartan (DIOVAN) 320 MG tablet Take 320 mg by mouth daily. 06/24/22   [provider]    Physical Exam: Vitals:   09/07/22 1345 09/07/22 1415 09/07/22 1430 09/07/22 1515  BP: 98/63 112/87 106/71 108/67  Pulse: 87 87 73 91  Resp: (!) 27 (!) 27 (!) 26 (!) 28  Temp:      SpO2: 91% 92% 93% 93%  Weight:      Height:        Virtual visit assisted by RN:   General:  Appears calm and comfortable and is in NAD Eyes:   EOMI, normal lids, iris ENT:  grossly normal hearing, lips & tongue, mmm; appropriate dentition Neck:  no LAD, masses or thyromegaly Cardiovascular:  RRR, no m/r/g. No LE edema. - per RN Respiratory:   CTA bilaterally with no wheezes/rales/rhonchi. - per RN Normal respiratory effort. Abdomen:  soft, NT, ND Skin:  no rash or induration seen on limited exam Musculoskeletal:   grossly normal tone BUE/BLE, no bony abnormality Psychiatric:  grossly normal mood and affect, speech fluent and appropriate, AOx3 Neurologic:  CN 2-12 grossly intact, moves all extremities in coordinated fashion   Radiological Exams on Admission: Independently reviewed - see discussion in A/P where applicable  No results found.  EKG: Independently reviewed.  NSR with rate 85; LAFB; nonspecific ST changes with artifact   Labs on Admission: I have personally reviewed the available labs and imaging studies at the time of the admission.  Pertinent labs:    Glucose 110 BUN 40/Creatinine 1.08/GFR 51; 20/0.92/57 on 9/27 HS troponin 5 WBC 16.5 COVID/flu/RSV negative UA: small LE, 30 protein, rare bacteria   Family Communication: Husband was present throughout virtual evaluation Primary team communication: I discussed the patient with Dr. Reather Converse at the time of the consult request and then notified both Drs. Zavitz and Countryman about the plan for dc to home following the visit  Thank you very much for involving [place] in the care of your patient.  Author: Karmen Bongo, MD 09/07/2022 4:02 PM  For on call review [email or web address].

## 2022-09-09 DIAGNOSIS — I10 Essential (primary) hypertension: Secondary | ICD-10-CM | POA: Diagnosis not present

## 2022-09-09 DIAGNOSIS — J189 Pneumonia, unspecified organism: Secondary | ICD-10-CM | POA: Diagnosis not present

## 2022-09-09 DIAGNOSIS — M810 Age-related osteoporosis without current pathological fracture: Secondary | ICD-10-CM | POA: Diagnosis not present

## 2022-09-14 ENCOUNTER — Telehealth: Payer: Self-pay

## 2022-09-14 NOTE — Telephone Encounter (Signed)
     Patient  visit on 09/07/2022  at [facility] was for weakness.  Have you been able to follow up with your primary care physician? Appointment scheduled 09/16/2022.  The patient was or was not able to obtain any needed medicine or equipment. Patient was able to obtain medication.  Are there diet recommendations that you are having difficulty following? No  Patient expresses understanding of discharge instructions and education provided has no other needs at this time. Yes   [place] Resource Care Guide   [email or web address]  ?? [phone]   Website: [email or web address]  [email or web address]

## 2022-09-16 DIAGNOSIS — J189 Pneumonia, unspecified organism: Secondary | ICD-10-CM | POA: Diagnosis not present

## 2022-09-16 DIAGNOSIS — R634 Abnormal weight loss: Secondary | ICD-10-CM | POA: Diagnosis not present

## 2022-10-06 DIAGNOSIS — J189 Pneumonia, unspecified organism: Secondary | ICD-10-CM | POA: Diagnosis not present

## 2022-10-06 DIAGNOSIS — R634 Abnormal weight loss: Secondary | ICD-10-CM | POA: Diagnosis not present

## 2022-10-06 DIAGNOSIS — I10 Essential (primary) hypertension: Secondary | ICD-10-CM | POA: Diagnosis not present

## 2022-10-12 DIAGNOSIS — J189 Pneumonia, unspecified organism: Secondary | ICD-10-CM | POA: Diagnosis not present

## 2022-10-12 DIAGNOSIS — R634 Abnormal weight loss: Secondary | ICD-10-CM | POA: Diagnosis not present

## 2022-10-12 DIAGNOSIS — R413 Other amnesia: Secondary | ICD-10-CM | POA: Diagnosis not present

## 2022-12-01 DIAGNOSIS — J189 Pneumonia, unspecified organism: Secondary | ICD-10-CM | POA: Diagnosis not present

## 2022-12-02 DIAGNOSIS — R8289 Other abnormal findings on cytological and histological examination of urine: Secondary | ICD-10-CM | POA: Diagnosis not present

## 2022-12-02 DIAGNOSIS — K219 Gastro-esophageal reflux disease without esophagitis: Secondary | ICD-10-CM | POA: Diagnosis not present

## 2022-12-02 DIAGNOSIS — N3281 Overactive bladder: Secondary | ICD-10-CM | POA: Diagnosis not present

## 2022-12-02 DIAGNOSIS — R197 Diarrhea, unspecified: Secondary | ICD-10-CM | POA: Diagnosis not present

## 2022-12-24 ENCOUNTER — Encounter: Payer: Self-pay | Admitting: Genetic Counselor

## 2022-12-24 NOTE — Progress Notes (Signed)
Update: MLH1 c.[id number] (p.[id number]) VUS has been amended to Benign. The amended report date is December 06, 2022.

## 2022-12-29 DIAGNOSIS — Z961 Presence of intraocular lens: Secondary | ICD-10-CM | POA: Diagnosis not present

## 2022-12-29 DIAGNOSIS — H401111 Primary open-angle glaucoma, right eye, mild stage: Secondary | ICD-10-CM | POA: Diagnosis not present

## 2022-12-29 DIAGNOSIS — H02052 Trichiasis without entropian right lower eyelid: Secondary | ICD-10-CM | POA: Diagnosis not present

## 2022-12-29 DIAGNOSIS — H02055 Trichiasis without entropian left lower eyelid: Secondary | ICD-10-CM | POA: Diagnosis not present

## 2022-12-29 DIAGNOSIS — H40022 Open angle with borderline findings, high risk, left eye: Secondary | ICD-10-CM | POA: Diagnosis not present

## 2023-03-07 ENCOUNTER — Encounter (HOSPITAL_BASED_OUTPATIENT_CLINIC_OR_DEPARTMENT_OTHER): Payer: Self-pay | Admitting: Emergency Medicine

## 2023-03-07 ENCOUNTER — Emergency Department (HOSPITAL_BASED_OUTPATIENT_CLINIC_OR_DEPARTMENT_OTHER)
Admission: EM | Admit: 2023-03-07 | Discharge: 2023-03-07 | Disposition: A | Discharge status: Home / Self Care | Payer: Medicare PPO | Attending: Emergency Medicine | Admitting: Emergency Medicine

## 2023-03-07 ENCOUNTER — Other Ambulatory Visit: Payer: Self-pay

## 2023-03-07 ENCOUNTER — Emergency Department (HOSPITAL_BASED_OUTPATIENT_CLINIC_OR_DEPARTMENT_OTHER): Payer: Medicare PPO | Admitting: Radiology

## 2023-03-07 ENCOUNTER — Other Ambulatory Visit (HOSPITAL_BASED_OUTPATIENT_CLINIC_OR_DEPARTMENT_OTHER): Payer: Self-pay

## 2023-03-07 DIAGNOSIS — U071 COVID-19: Secondary | ICD-10-CM | POA: Diagnosis not present

## 2023-03-07 DIAGNOSIS — B349 Viral infection, unspecified: Secondary | ICD-10-CM | POA: Diagnosis not present

## 2023-03-07 DIAGNOSIS — J1282 Pneumonia due to coronavirus disease 2019: Secondary | ICD-10-CM | POA: Insufficient documentation

## 2023-03-07 DIAGNOSIS — R059 Cough, unspecified: Secondary | ICD-10-CM | POA: Diagnosis not present

## 2023-03-07 DIAGNOSIS — J189 Pneumonia, unspecified organism: Secondary | ICD-10-CM | POA: Diagnosis not present

## 2023-03-07 DIAGNOSIS — J984 Other disorders of lung: Secondary | ICD-10-CM | POA: Diagnosis not present

## 2023-03-07 DIAGNOSIS — R918 Other nonspecific abnormal finding of lung field: Secondary | ICD-10-CM | POA: Diagnosis not present

## 2023-03-07 DIAGNOSIS — R112 Nausea with vomiting, unspecified: Secondary | ICD-10-CM | POA: Diagnosis present

## 2023-03-07 LAB — CBC WITH DIFFERENTIAL/PLATELET
Abs Immature Granulocytes: 0.05 10*3/uL (ref 0.00–0.07)
Basophils Absolute: 0 10*3/uL (ref 0.0–0.1)
Basophils Relative: 0 %
Eosinophils Absolute: 0 10*3/uL (ref 0.0–0.5)
Eosinophils Relative: 0 %
HCT: 38.8 % (ref 36.0–46.0)
Hemoglobin: 12.7 g/dL (ref 12.0–15.0)
Immature Granulocytes: 0 %
Lymphocytes Relative: 6 %
Lymphs Abs: 0.8 10*3/uL (ref 0.7–4.0)
MCH: 28.7 pg (ref 26.0–34.0)
MCHC: 32.7 g/dL (ref 30.0–36.0)
MCV: 87.8 fL (ref 80.0–100.0)
Monocytes Absolute: 0.6 10*3/uL (ref 0.1–1.0)
Monocytes Relative: 4 %
Neutro Abs: 11 10*3/uL — ABNORMAL HIGH (ref 1.7–7.7)
Neutrophils Relative %: 90 %
Platelets: 287 10*3/uL (ref 150–400)
RBC: 4.42 MIL/uL (ref 3.87–5.11)
RDW: 13.4 % (ref 11.5–15.5)
WBC: 12.4 10*3/uL — ABNORMAL HIGH (ref 4.0–10.5)
nRBC: 0 % (ref 0.0–0.2)

## 2023-03-07 LAB — COMPREHENSIVE METABOLIC PANEL
ALT: 11 U/L (ref 0–44)
AST: 24 U/L (ref 15–41)
Albumin: 4.4 g/dL (ref 3.5–5.0)
Alkaline Phosphatase: 45 U/L (ref 38–126)
Anion gap: 9 (ref 5–15)
BUN: 28 mg/dL — ABNORMAL HIGH (ref 8–23)
CO2: 26 mmol/L (ref 22–32)
Calcium: 9.5 mg/dL (ref 8.9–10.3)
Chloride: 107 mmol/L (ref 98–111)
Creatinine, Ser: 0.82 mg/dL (ref 0.44–1.00)
GFR, Estimated: >60 mL/min (ref >60)
Glucose, Bld: 121 mg/dL — ABNORMAL HIGH (ref 70–99)
Potassium: 4.3 mmol/L (ref 3.5–5.1)
Sodium: 142 mmol/L (ref 135–145)
Total Bilirubin: 0.5 mg/dL (ref 0.3–1.2)
Total Protein: 7.3 g/dL (ref 6.5–8.1)

## 2023-03-07 LAB — RESP PANEL BY RT-PCR (RSV, FLU A&B, COVID)  RVPGX2
Influenza A by PCR: NEGATIVE
Influenza B by PCR: NEGATIVE
Resp Syncytial Virus by PCR: NEGATIVE
SARS Coronavirus 2 by RT PCR: POSITIVE — AB

## 2023-03-07 LAB — LIPASE, BLOOD: Lipase: <10 U/L — ABNORMAL LOW (ref 11–51)

## 2023-03-07 MED ORDER — ONDANSETRON 4 MG PO TBDP
4.0000 mg | ORAL_TABLET | Freq: Three times a day (TID) | ORAL | 0 refills | Status: AC | PRN
  Filled 2023-03-07: qty 20, 7d supply, fill #0

## 2023-03-07 MED ORDER — DOXYCYCLINE HYCLATE 100 MG PO CAPS
100.0000 mg | ORAL_CAPSULE | Freq: Two times a day (BID) | ORAL | 0 refills | Status: AC
  Filled 2023-03-07: qty 20, 10d supply, fill #0

## 2023-03-07 MED ORDER — PAXLOVID (300/100) 20 X 150 MG & 10 X 100MG PO TBPK
3.0000 | ORAL_TABLET | Freq: Two times a day (BID) | ORAL | 0 refills | Status: AC
  Filled 2023-03-07: qty 30, 5d supply, fill #0

## 2023-03-07 NOTE — ED Provider Notes (Signed)
[place] EMERGENCY DEPARTMENT AT [facility] Provider Note   CSN: [id number] Arrival date & time: 03/07/23  1222     History  Chief Complaint  Patient presents with   Emesis   Diarrhea    LIANNAH ALTHEIDE is a 87 y.o. female.  Patient here after some episodes of diarrhea nausea vomiting and chills overnight.  May be mild cough.  Was concern for may be pneumonia by assisted-living facility.  She denies any abdominal pain nausea vomiting currently.  She is feeling much better.  No fever otherwise.  Denies any ongoing cough.  No headache fever chills chest pain or shortness of breath.  The history is provided by the patient.       Home Medications Prior to Admission medications   Medication Sig Start Date End Date Taking? Authorizing Provider  doxycycline (VIBRAMYCIN) 100 MG capsule Take 1 capsule (100 mg total) by mouth 2 (two) times daily. 03/07/23  Yes Roya Gieselman, DO  ondansetron (ZOFRAN-ODT) 4 MG disintegrating tablet Take 1 tablet (4 mg total) by mouth every 8 (eight) hours as needed. 03/07/23  Yes Dorothia Passmore, DO  Calcium Carbonate-Vit D-Min (CALCIUM 1200 PO) Take 1 capsule by mouth daily.    [provider]  cefdinir (OMNICEF) 300 MG capsule Take 1 capsule (300 mg total) by mouth 2 (two) times daily. 09/07/22   Glyn Ade, MD  GEMTESA 75 MG TABS Take 1 tablet by mouth daily. 08/23/22   [provider]  Latanoprostene Bunod (VYZULTA) 0.024 % SOLN Place 1 drop into both eyes every evening. Affected eye    [provider]  losartan (COZAAR) 50 MG tablet Take 50 mg by mouth daily.    [provider]  omeprazole (PRILOSEC) 40 MG capsule 40 mg daily. 12/08/19   [provider]  rosuvastatin (CRESTOR) 10 MG tablet Take 10 mg by mouth at bedtime. 05/31/22   [provider]  valsartan (DIOVAN) 320 MG tablet Take 320 mg by mouth daily. 06/24/22   [provider]      Allergies    Levaquin  [levofloxacin], Codeine, Corticotropin, Keflex [cephalexin], Morphine and codeine, and Macrodantin [nitrofurantoin macrocrystal]    Review of Systems   Review of Systems  Physical Exam Updated Vital Signs BP 120/75 (BP Location: Right Arm)   Pulse 69   Temp 97.7 F (36.5 C) (Oral)   Resp 18   Ht 5\' 2"  (1.575 m)   Wt 51.7 kg   SpO2 97%   BMI 20.85 kg/m  Physical Exam Vitals and nursing note reviewed.  Constitutional:      General: She is not in acute distress.    Appearance: She is well-developed.  HENT:     Head: Normocephalic and atraumatic.     Nose: Nose normal.     Mouth/Throat:     Mouth: Mucous membranes are moist.  Eyes:     Extraocular Movements: Extraocular movements intact.     Conjunctiva/sclera: Conjunctivae normal.     Pupils: Pupils are equal, round, and reactive to light.  Cardiovascular:     Rate and Rhythm: Normal rate and regular rhythm.     Pulses: Normal pulses.     Heart sounds: Normal heart sounds. No murmur heard. Pulmonary:     Effort: Pulmonary effort is normal. No respiratory distress.     Breath sounds: Normal breath sounds.  Abdominal:     General: Abdomen is flat.     Palpations: Abdomen is soft.     Tenderness: There  is no abdominal tenderness.  Musculoskeletal:        General: No swelling.     Cervical back: Neck supple.  Skin:    General: Skin is warm and dry.     Capillary Refill: Capillary refill takes less than 2 seconds.  Neurological:     General: No focal deficit present.     Mental Status: She is alert.  Psychiatric:        Mood and Affect: Mood normal.     ED Results / Procedures / Treatments   Labs (all labs ordered are listed, but only abnormal results are displayed) Labs Reviewed  CBC WITH DIFFERENTIAL/PLATELET - Abnormal; Notable for the following components:      Result Value   WBC 12.4 (*)    Neutro Abs 11.0 (*)    All other components within normal limits  COMPREHENSIVE METABOLIC PANEL - Abnormal; Notable  for the following components:   Glucose, Bld 121 (*)    BUN 28 (*)    All other components within normal limits  LIPASE, BLOOD - Abnormal; Notable for the following components:   Lipase <10 (*)    All other components within normal limits  RESP PANEL BY RT-PCR (RSV, FLU A&B, COVID)  [id number]    EKG None  Radiology DG Chest 2 View  Result Date: 03/07/2023 CLINICAL DATA:  Cough. EXAM: CHEST - 2 VIEW COMPARISON:  09/07/2022 FINDINGS: Patchy airspace disease is noted in the peripheral right lung base with probable tiny right effusion. Left lung clear. The cardiopericardial silhouette is within normal limits for size. No acute bony abnormality. IMPRESSION: Patchy airspace disease in the peripheral right lung base with probable tiny right effusion. Imaging features compatible with pneumonia. Follow-up recommended to ensure resolution. Electronically Signed   By: Kennith Center M.D.   On: 03/07/2023 15:58    Procedures Procedures    Medications Ordered in ED Medications - No data to display  ED Course/ Medical Decision Making/ A&P                             Medical Decision Making Amount and/or Complexity of Data Reviewed Labs: ordered. Radiology: ordered.  Risk Prescription drug management.   YESHA WISENER is here with viral symptoms.  History of bronchiectasis, recurrent UTIs, dementia.  Overall nausea vomiting diarrhea type symptoms.  No abdominal pain.  May be cough.  No sputum production.  Well-appearing.  Asymptomatic now.  No abdominal tenderness on exam.  Differential diagnosis likely viral process, may be pneumonia seems less likely to be an intra-abdominal process at this time otherwise.  She is very well-appearing.  She is able to tolerate p.o.  She is already had blood work done with CBC and BMP and chest x-ray.  Per my review interpretation of labs is no significant anemia or electrolyte abnormality or kidney injury.  Chest x-ray per radiology report may be with features  concerning for pneumonia.  Will treat with doxycycline.  Will treat with Zofran.  She understands return precautions.  She does not meet sepsis criteria.  She understands return precautions.  She already has follow-up with primary care doctor later this week.  She understands return precautions and discharged in the ED in good condition.  If her COVID test is positive we will call in antiviral.  This chart was dictated using voice recognition software.  Despite best efforts to proofread,  errors can occur which can change the documentation meaning.  Final Clinical Impression(s) / ED Diagnoses Final diagnoses:  Community acquired pneumonia, unspecified laterality  Viral illness    Rx / DC Orders ED Discharge Orders          Ordered    doxycycline (VIBRAMYCIN) 100 MG capsule  2 times daily        03/07/23 1704    ondansetron (ZOFRAN-ODT) 4 MG disintegrating tablet  Every 8 hours PRN        03/07/23 1704              Virgina Norfolk, DO 03/07/23 1707

## 2023-03-07 NOTE — ED Notes (Addendum)
Attempted to call report, voicemail says that if it is after 530pm they are unavailable and call the nursing supervisor.  [phone]

## 2023-03-07 NOTE — ED Notes (Signed)
Report called to RN Christi at [phone], pt in bed sig other at bedside, states that they live in the independent living section, both verbalized understanding d/c and follow up, advised to return for any concerns or worsening symptoms. Pt ambulatory from department.

## 2023-03-07 NOTE — ED Notes (Signed)
Attempted to call report, no answer

## 2023-03-07 NOTE — Discharge Instructions (Signed)
Overall suspect viral illness that should self resolve.  I have prescribed you antibiotic to cover for possible pneumonia.  It would likely be beneficial that you repeat chest x-ray to ensure that things clear up.  Follow-up with your primary care doctor as discussed.  Take Zofran as needed for nausea.  Please return if symptoms worsen including worsening fever, abdominal pain, nausea and vomiting.

## 2023-03-07 NOTE — ED Notes (Signed)
Pt in bed, pt has no complaints at this time. Denies pain, states that she is feeling better.

## 2023-03-07 NOTE — ED Triage Notes (Signed)
Pt arrives to ED with c/o emesis, chills, diarrhea that started at 2am this morning and last around 2 hours.

## 2023-03-07 NOTE — ED Notes (Signed)
Called [phone] and continue to not get an answer, attempted to call report to nursing supervisor at [phone] also no answer,

## 2023-03-14 DIAGNOSIS — M810 Age-related osteoporosis without current pathological fracture: Secondary | ICD-10-CM | POA: Diagnosis not present

## 2023-03-16 DIAGNOSIS — J189 Pneumonia, unspecified organism: Secondary | ICD-10-CM | POA: Diagnosis not present

## 2023-03-16 DIAGNOSIS — K5909 Other constipation: Secondary | ICD-10-CM | POA: Diagnosis not present

## 2023-03-24 DIAGNOSIS — N3941 Urge incontinence: Secondary | ICD-10-CM | POA: Diagnosis not present

## 2023-03-24 DIAGNOSIS — R350 Frequency of micturition: Secondary | ICD-10-CM | POA: Diagnosis not present

## 2023-04-08 DIAGNOSIS — J189 Pneumonia, unspecified organism: Secondary | ICD-10-CM | POA: Diagnosis not present

## 2023-04-25 DIAGNOSIS — L821 Other seborrheic keratosis: Secondary | ICD-10-CM | POA: Diagnosis not present

## 2023-04-25 DIAGNOSIS — L570 Actinic keratosis: Secondary | ICD-10-CM | POA: Diagnosis not present

## 2023-04-25 DIAGNOSIS — L814 Other melanin hyperpigmentation: Secondary | ICD-10-CM | POA: Diagnosis not present

## 2023-04-25 DIAGNOSIS — D1801 Hemangioma of skin and subcutaneous tissue: Secondary | ICD-10-CM | POA: Diagnosis not present

## 2023-05-09 DIAGNOSIS — H02055 Trichiasis without entropian left lower eyelid: Secondary | ICD-10-CM | POA: Diagnosis not present

## 2023-05-09 DIAGNOSIS — H02052 Trichiasis without entropian right lower eyelid: Secondary | ICD-10-CM | POA: Diagnosis not present

## 2023-05-09 DIAGNOSIS — H401111 Primary open-angle glaucoma, right eye, mild stage: Secondary | ICD-10-CM | POA: Diagnosis not present

## 2023-05-09 DIAGNOSIS — Z961 Presence of intraocular lens: Secondary | ICD-10-CM | POA: Diagnosis not present

## 2023-05-09 DIAGNOSIS — H40022 Open angle with borderline findings, high risk, left eye: Secondary | ICD-10-CM | POA: Diagnosis not present

## 2023-05-31 DIAGNOSIS — L820 Inflamed seborrheic keratosis: Secondary | ICD-10-CM | POA: Diagnosis not present

## 2023-05-31 DIAGNOSIS — L821 Other seborrheic keratosis: Secondary | ICD-10-CM | POA: Diagnosis not present

## 2023-05-31 DIAGNOSIS — L814 Other melanin hyperpigmentation: Secondary | ICD-10-CM | POA: Diagnosis not present

## 2023-05-31 DIAGNOSIS — L905 Scar conditions and fibrosis of skin: Secondary | ICD-10-CM | POA: Diagnosis not present

## 2023-05-31 DIAGNOSIS — D1801 Hemangioma of skin and subcutaneous tissue: Secondary | ICD-10-CM | POA: Diagnosis not present

## 2023-05-31 DIAGNOSIS — L570 Actinic keratosis: Secondary | ICD-10-CM | POA: Diagnosis not present

## 2023-07-05 ENCOUNTER — Encounter: Payer: Self-pay | Admitting: Internal Medicine

## 2023-07-15 ENCOUNTER — Other Ambulatory Visit: Payer: Self-pay | Admitting: Internal Medicine

## 2023-07-15 DIAGNOSIS — R911 Solitary pulmonary nodule: Secondary | ICD-10-CM

## 2023-07-20 DIAGNOSIS — M1711 Unilateral primary osteoarthritis, right knee: Secondary | ICD-10-CM | POA: Diagnosis not present

## 2023-07-28 ENCOUNTER — Encounter: Payer: Self-pay | Admitting: Internal Medicine

## 2023-08-01 ENCOUNTER — Ambulatory Visit
Admission: RE | Admit: 2023-08-01 | Discharge: 2023-08-01 | Disposition: A | Discharge status: Home / Self Care | Payer: Medicare PPO | Source: Ambulatory Visit | Attending: Internal Medicine | Admitting: Internal Medicine

## 2023-08-01 DIAGNOSIS — K449 Diaphragmatic hernia without obstruction or gangrene: Secondary | ICD-10-CM | POA: Diagnosis not present

## 2023-08-01 DIAGNOSIS — R911 Solitary pulmonary nodule: Secondary | ICD-10-CM

## 2023-08-01 DIAGNOSIS — I700 Atherosclerosis of aorta: Secondary | ICD-10-CM | POA: Diagnosis not present

## 2023-08-01 DIAGNOSIS — I7121 Aneurysm of the ascending aorta, without rupture: Secondary | ICD-10-CM | POA: Diagnosis not present

## 2023-08-01 MED ORDER — IOPAMIDOL (ISOVUE-300) INJECTION 61%
100.0000 mL | Freq: Once | INTRAVENOUS | Status: AC | PRN
  Administered 2023-08-01: 75 mL via INTRAVENOUS

## 2023-08-24 DIAGNOSIS — N3946 Mixed incontinence: Secondary | ICD-10-CM | POA: Diagnosis not present

## 2023-09-12 DIAGNOSIS — Z961 Presence of intraocular lens: Secondary | ICD-10-CM | POA: Diagnosis not present

## 2023-09-12 DIAGNOSIS — H02052 Trichiasis without entropian right lower eyelid: Secondary | ICD-10-CM | POA: Diagnosis not present

## 2023-09-12 DIAGNOSIS — H40022 Open angle with borderline findings, high risk, left eye: Secondary | ICD-10-CM | POA: Diagnosis not present

## 2023-09-12 DIAGNOSIS — H02055 Trichiasis without entropian left lower eyelid: Secondary | ICD-10-CM | POA: Diagnosis not present

## 2023-09-12 DIAGNOSIS — H401111 Primary open-angle glaucoma, right eye, mild stage: Secondary | ICD-10-CM | POA: Diagnosis not present

## 2023-09-15 DIAGNOSIS — I10 Essential (primary) hypertension: Secondary | ICD-10-CM | POA: Diagnosis not present

## 2023-09-15 DIAGNOSIS — E7800 Pure hypercholesterolemia, unspecified: Secondary | ICD-10-CM | POA: Diagnosis not present

## 2023-09-15 DIAGNOSIS — M810 Age-related osteoporosis without current pathological fracture: Secondary | ICD-10-CM | POA: Diagnosis not present

## 2023-09-21 DIAGNOSIS — I722 Aneurysm of renal artery: Secondary | ICD-10-CM | POA: Diagnosis not present

## 2023-09-21 DIAGNOSIS — N3281 Overactive bladder: Secondary | ICD-10-CM | POA: Diagnosis not present

## 2023-09-21 DIAGNOSIS — I10 Essential (primary) hypertension: Secondary | ICD-10-CM | POA: Diagnosis not present

## 2023-09-21 DIAGNOSIS — J309 Allergic rhinitis, unspecified: Secondary | ICD-10-CM | POA: Diagnosis not present

## 2023-09-21 DIAGNOSIS — Z0000 Encounter for general adult medical examination without abnormal findings: Secondary | ICD-10-CM | POA: Diagnosis not present

## 2023-09-21 DIAGNOSIS — M810 Age-related osteoporosis without current pathological fracture: Secondary | ICD-10-CM | POA: Diagnosis not present

## 2023-09-26 ENCOUNTER — Other Ambulatory Visit (HOSPITAL_BASED_OUTPATIENT_CLINIC_OR_DEPARTMENT_OTHER): Payer: Self-pay

## 2023-12-14 DIAGNOSIS — M1991 Primary osteoarthritis, unspecified site: Secondary | ICD-10-CM | POA: Diagnosis not present

## 2023-12-14 DIAGNOSIS — R41841 Cognitive communication deficit: Secondary | ICD-10-CM | POA: Diagnosis not present

## 2023-12-20 DIAGNOSIS — F332 Major depressive disorder, recurrent severe without psychotic features: Secondary | ICD-10-CM | POA: Diagnosis not present

## 2023-12-22 DIAGNOSIS — I10 Essential (primary) hypertension: Secondary | ICD-10-CM | POA: Diagnosis not present

## 2023-12-22 DIAGNOSIS — M810 Age-related osteoporosis without current pathological fracture: Secondary | ICD-10-CM | POA: Diagnosis not present

## 2023-12-27 DIAGNOSIS — F332 Major depressive disorder, recurrent severe without psychotic features: Secondary | ICD-10-CM | POA: Diagnosis not present

## 2024-01-16 DIAGNOSIS — Z961 Presence of intraocular lens: Secondary | ICD-10-CM | POA: Diagnosis not present

## 2024-01-16 DIAGNOSIS — H40022 Open angle with borderline findings, high risk, left eye: Secondary | ICD-10-CM | POA: Diagnosis not present

## 2024-01-16 DIAGNOSIS — H02055 Trichiasis without entropian left lower eyelid: Secondary | ICD-10-CM | POA: Diagnosis not present

## 2024-01-16 DIAGNOSIS — H401111 Primary open-angle glaucoma, right eye, mild stage: Secondary | ICD-10-CM | POA: Diagnosis not present

## 2024-01-16 DIAGNOSIS — H02052 Trichiasis without entropian right lower eyelid: Secondary | ICD-10-CM | POA: Diagnosis not present

## 2024-01-25 DIAGNOSIS — R413 Other amnesia: Secondary | ICD-10-CM | POA: Diagnosis not present

## 2024-01-25 DIAGNOSIS — G4720 Circadian rhythm sleep disorder, unspecified type: Secondary | ICD-10-CM | POA: Diagnosis not present

## 2024-01-25 DIAGNOSIS — F332 Major depressive disorder, recurrent severe without psychotic features: Secondary | ICD-10-CM | POA: Diagnosis not present

## 2024-02-09 DIAGNOSIS — G319 Degenerative disease of nervous system, unspecified: Secondary | ICD-10-CM | POA: Diagnosis not present

## 2024-02-09 DIAGNOSIS — R413 Other amnesia: Secondary | ICD-10-CM | POA: Diagnosis not present

## 2024-02-23 DIAGNOSIS — F332 Major depressive disorder, recurrent severe without psychotic features: Secondary | ICD-10-CM | POA: Diagnosis not present

## 2024-02-23 DIAGNOSIS — R413 Other amnesia: Secondary | ICD-10-CM | POA: Diagnosis not present

## 2024-02-29 DIAGNOSIS — R413 Other amnesia: Secondary | ICD-10-CM | POA: Diagnosis not present

## 2024-02-29 DIAGNOSIS — I10 Essential (primary) hypertension: Secondary | ICD-10-CM | POA: Diagnosis not present

## 2024-02-29 DIAGNOSIS — E440 Moderate protein-calorie malnutrition: Secondary | ICD-10-CM | POA: Diagnosis not present

## 2024-03-15 DIAGNOSIS — M810 Age-related osteoporosis without current pathological fracture: Secondary | ICD-10-CM | POA: Diagnosis not present

## 2024-03-16 DIAGNOSIS — M1711 Unilateral primary osteoarthritis, right knee: Secondary | ICD-10-CM | POA: Diagnosis not present

## 2024-04-10 ENCOUNTER — Ambulatory Visit: Admitting: Internal Medicine

## 2024-04-10 VITALS — BP 120/70 | Temp 98.0°F | Resp 18 | Wt 110.0 lb

## 2024-04-10 DIAGNOSIS — K5901 Slow transit constipation: Secondary | ICD-10-CM | POA: Diagnosis not present

## 2024-04-10 MED ORDER — POLYETHYLENE GLYCOL 3350 17 G PO PACK: 17.0000 g | PACK | Freq: Every day | ORAL | 0 refills | Status: AC

## 2024-04-10 NOTE — Progress Notes (Signed)
   Acute Office Visit  Subjective:     Patient ID: Tina Bell, female    DOB: 01/04/1936, 88 y.o.   MRN: [id number]  Chief Complaint  Patient presents with   office visit    Irregular stool and fatigue for 2 days    HPI Patient is in today for small hard stool pieces noted in her panty today.  She says that this has happened 2nd time.  She admitted that she does not drink enough fluid.  She denies straining for bowel movement and denies any constipation.  No diarrhea no abdominal pain.  She notes it is multiple pieces of hard stool today and that worries her so she wanted that to be checked.  No rectal pain.  No other complaint.  She admitted that she as dark yellow  urine.  Review of Systems  Constitutional: Negative.   Gastrointestinal:  Positive for constipation.        Objective:    BP 120/70   Temp 98 F (36.7 C)   Resp 18   Wt 110 lb (49.9 kg)   SpO2 98%   BMI 20.12 kg/m    Physical Exam Constitutional:      Appearance: Normal appearance.  Abdominal:     General: Bowel sounds are normal.  Neurological:     Mental Status: She is alert.     No results found for any visits on 04/10/24.      Assessment & Plan:   Problem List Items Addressed This Visit       Digestive   Slow transit constipation - Primary     I have discussed with her to drink more water and take 1 tbsp of MiraLax  in a glass of water daily for 2 weeks.  She probably has constipation as she does not drink enough fluid.  If she continued to have this problem she will follow-up with her family doctor.       No orders of the defined types were placed in this encounter.   No follow-ups on file.  Roetta Dare, MD

## 2024-04-10 NOTE — Assessment & Plan Note (Signed)
 I have discussed with her to drink more water and take 1 tbsp of MiraLax  in a glass of water daily for 2 weeks.  She probably has constipation as she does not drink enough fluid.  If she continued to have this problem she will follow-up with her family doctor.

## 2024-04-17 DIAGNOSIS — M545 Low back pain, unspecified: Secondary | ICD-10-CM | POA: Diagnosis not present

## 2024-04-17 DIAGNOSIS — K5900 Constipation, unspecified: Secondary | ICD-10-CM | POA: Diagnosis not present

## 2024-04-17 DIAGNOSIS — G44209 Tension-type headache, unspecified, not intractable: Secondary | ICD-10-CM | POA: Diagnosis not present

## 2024-05-17 DIAGNOSIS — F329 Major depressive disorder, single episode, unspecified: Secondary | ICD-10-CM | POA: Diagnosis not present

## 2024-05-17 DIAGNOSIS — F03A0 Unspecified dementia, mild, without behavioral disturbance, psychotic disturbance, mood disturbance, and anxiety: Secondary | ICD-10-CM | POA: Diagnosis not present

## 2024-05-17 DIAGNOSIS — Z23 Encounter for immunization: Secondary | ICD-10-CM | POA: Diagnosis not present

## 2024-05-23 DIAGNOSIS — H40022 Open angle with borderline findings, high risk, left eye: Secondary | ICD-10-CM | POA: Diagnosis not present

## 2024-05-23 DIAGNOSIS — H02055 Trichiasis without entropian left lower eyelid: Secondary | ICD-10-CM | POA: Diagnosis not present

## 2024-05-23 DIAGNOSIS — Z961 Presence of intraocular lens: Secondary | ICD-10-CM | POA: Diagnosis not present

## 2024-05-23 DIAGNOSIS — H401111 Primary open-angle glaucoma, right eye, mild stage: Secondary | ICD-10-CM | POA: Diagnosis not present

## 2024-05-23 DIAGNOSIS — H02052 Trichiasis without entropian right lower eyelid: Secondary | ICD-10-CM | POA: Diagnosis not present

## 2024-05-25 DIAGNOSIS — F329 Major depressive disorder, single episode, unspecified: Secondary | ICD-10-CM | POA: Diagnosis not present

## 2024-05-25 DIAGNOSIS — F03A0 Unspecified dementia, mild, without behavioral disturbance, psychotic disturbance, mood disturbance, and anxiety: Secondary | ICD-10-CM | POA: Diagnosis not present

## 2024-05-25 DIAGNOSIS — I712 Thoracic aortic aneurysm, without rupture, unspecified: Secondary | ICD-10-CM | POA: Diagnosis not present

## 2024-05-30 DIAGNOSIS — I712 Thoracic aortic aneurysm, without rupture, unspecified: Secondary | ICD-10-CM | POA: Diagnosis not present

## 2024-05-30 DIAGNOSIS — F329 Major depressive disorder, single episode, unspecified: Secondary | ICD-10-CM | POA: Diagnosis not present

## 2024-05-30 DIAGNOSIS — F03A0 Unspecified dementia, mild, without behavioral disturbance, psychotic disturbance, mood disturbance, and anxiety: Secondary | ICD-10-CM | POA: Diagnosis not present

## 2024-05-30 DIAGNOSIS — I10 Essential (primary) hypertension: Secondary | ICD-10-CM | POA: Diagnosis not present

## 2024-06-12 DIAGNOSIS — M1711 Unilateral primary osteoarthritis, right knee: Secondary | ICD-10-CM | POA: Diagnosis not present

## 2024-06-12 DIAGNOSIS — M25562 Pain in left knee: Secondary | ICD-10-CM | POA: Diagnosis not present

## 2024-06-15 DIAGNOSIS — H409 Unspecified glaucoma: Secondary | ICD-10-CM | POA: Diagnosis not present

## 2024-06-15 DIAGNOSIS — Z8249 Family history of ischemic heart disease and other diseases of the circulatory system: Secondary | ICD-10-CM | POA: Diagnosis not present

## 2024-06-15 DIAGNOSIS — I129 Hypertensive chronic kidney disease with stage 1 through stage 4 chronic kidney disease, or unspecified chronic kidney disease: Secondary | ICD-10-CM | POA: Diagnosis not present

## 2024-06-15 DIAGNOSIS — I700 Atherosclerosis of aorta: Secondary | ICD-10-CM | POA: Diagnosis not present

## 2024-06-15 DIAGNOSIS — M810 Age-related osteoporosis without current pathological fracture: Secondary | ICD-10-CM | POA: Diagnosis not present

## 2024-06-15 DIAGNOSIS — G309 Alzheimer's disease, unspecified: Secondary | ICD-10-CM | POA: Diagnosis not present

## 2024-06-15 DIAGNOSIS — R32 Unspecified urinary incontinence: Secondary | ICD-10-CM | POA: Diagnosis not present

## 2024-06-15 DIAGNOSIS — N1831 Chronic kidney disease, stage 3a: Secondary | ICD-10-CM | POA: Diagnosis not present

## 2024-06-15 DIAGNOSIS — F325 Major depressive disorder, single episode, in full remission: Secondary | ICD-10-CM | POA: Diagnosis not present

## 2024-06-19 DIAGNOSIS — F03A0 Unspecified dementia, mild, without behavioral disturbance, psychotic disturbance, mood disturbance, and anxiety: Secondary | ICD-10-CM | POA: Diagnosis not present

## 2024-06-27 DIAGNOSIS — I712 Thoracic aortic aneurysm, without rupture, unspecified: Secondary | ICD-10-CM | POA: Diagnosis not present

## 2024-06-27 DIAGNOSIS — I10 Essential (primary) hypertension: Secondary | ICD-10-CM | POA: Diagnosis not present

## 2024-06-27 DIAGNOSIS — F03A0 Unspecified dementia, mild, without behavioral disturbance, psychotic disturbance, mood disturbance, and anxiety: Secondary | ICD-10-CM | POA: Diagnosis not present

## 2024-06-27 DIAGNOSIS — F329 Major depressive disorder, single episode, unspecified: Secondary | ICD-10-CM | POA: Diagnosis not present
